# Patient Record
Sex: Female | Born: 1990 | Race: Black or African American | Hispanic: No | Marital: Married | Smoking: Former smoker
Health system: Southern US, Community
[De-identification: ages and names within clinical notes are randomized; demographics above are authoritative.]

## PROBLEM LIST (undated history)

## (undated) ENCOUNTER — Inpatient Hospital Stay (HOSPITAL_COMMUNITY): Payer: Self-pay

## (undated) DIAGNOSIS — N63 Unspecified lump in unspecified breast: Secondary | ICD-10-CM

## (undated) DIAGNOSIS — N926 Irregular menstruation, unspecified: Secondary | ICD-10-CM

## (undated) DIAGNOSIS — O009 Unspecified ectopic pregnancy without intrauterine pregnancy: Secondary | ICD-10-CM

## (undated) DIAGNOSIS — Z8619 Personal history of other infectious and parasitic diseases: Secondary | ICD-10-CM

## (undated) DIAGNOSIS — N898 Other specified noninflammatory disorders of vagina: Secondary | ICD-10-CM

## (undated) DIAGNOSIS — N760 Acute vaginitis: Secondary | ICD-10-CM

## (undated) DIAGNOSIS — IMO0002 Reserved for concepts with insufficient information to code with codable children: Secondary | ICD-10-CM

## (undated) DIAGNOSIS — D249 Benign neoplasm of unspecified breast: Secondary | ICD-10-CM

## (undated) DIAGNOSIS — B373 Candidiasis of vulva and vagina: Secondary | ICD-10-CM

## (undated) DIAGNOSIS — R1115 Cyclical vomiting syndrome unrelated to migraine: Secondary | ICD-10-CM

## (undated) DIAGNOSIS — B9689 Other specified bacterial agents as the cause of diseases classified elsewhere: Secondary | ICD-10-CM

## (undated) HISTORY — DX: Other specified noninflammatory disorders of vagina: N89.8

## (undated) HISTORY — DX: Reserved for concepts with insufficient information to code with codable children: IMO0002

## (undated) HISTORY — DX: Benign neoplasm of unspecified breast: D24.9

## (undated) HISTORY — DX: Irregular menstruation, unspecified: N92.6

## (undated) HISTORY — DX: Unspecified lump in unspecified breast: N63.0

## (undated) HISTORY — DX: Acute vaginitis: N76.0

## (undated) HISTORY — PX: DILATION AND CURETTAGE OF UTERUS: SHX78

## (undated) HISTORY — DX: Personal history of other infectious and parasitic diseases: Z86.19

## (undated) HISTORY — DX: Candidiasis of vulva and vagina: B37.3

## (undated) HISTORY — DX: Other specified bacterial agents as the cause of diseases classified elsewhere: B96.89

---
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
## 2009-09-12 DIAGNOSIS — B9689 Other specified bacterial agents as the cause of diseases classified elsewhere: Secondary | ICD-10-CM

## 2009-09-12 HISTORY — DX: Other specified bacterial agents as the cause of diseases classified elsewhere: B96.89

## 2010-04-26 DIAGNOSIS — B373 Candidiasis of vulva and vagina: Secondary | ICD-10-CM

## 2010-04-26 DIAGNOSIS — N63 Unspecified lump in unspecified breast: Secondary | ICD-10-CM

## 2010-04-26 DIAGNOSIS — B3731 Acute candidiasis of vulva and vagina: Secondary | ICD-10-CM

## 2010-04-26 HISTORY — DX: Candidiasis of vulva and vagina: B37.3

## 2010-04-26 HISTORY — DX: Unspecified lump in unspecified breast: N63.0

## 2010-04-26 HISTORY — DX: Acute candidiasis of vulva and vagina: B37.31

## 2010-05-08 ENCOUNTER — Encounter
Admission: RE | Admit: 2010-05-08 | Discharge: 2010-05-08 | Payer: Self-pay | Source: Home / Self Care | Attending: Obstetrics and Gynecology | Admitting: Obstetrics and Gynecology

## 2010-09-21 NOTE — Op Note (Signed)
NAMEBRIGIDA, Debbie Hughes             ACCOUNT NO.:  [id number]   MEDICAL RECORD NO.:  [id number]          PATIENT TYPE:  AMB   LOCATION:  DSC                          FACILITY:  [facility]   PHYSICIAN:  Leonie Man, M.D.   DATE OF BIRTH:  April 09, 1991   DATE OF PROCEDURE:  05/02/2006  DATE OF DISCHARGE:                               OPERATIVE REPORT   PREOPERATIVE DIAGNOSIS:  Right breast mass, probable fibroadenoma.   POSTOPERATIVE DIAGNOSIS:  Right breast mass, probable fibroadenoma,  pathology pending.   PROCEDURE:  Excision mass right left breast   SURGEON:  Leonie Man, M.D.   ASSISTANT:  OR nurse.   ANESTHESIA:  General.   SPECIMENS TO LAB:  Breast mass.   ESTIMATED BLOOD LOSS:  Minimal.   COMPLICATIONS:  None.   DISPOSITION:  The patient to PACU in excellent condition.   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  The mass measured approximately 3 cm in greatest diameter and is mobile.  The patient comes to the operating room after consent was obtained from  her family and from the patient for excision of this mass.   PROCEDURE:  The patient was positioned supinely, and following the  induction of satisfactory anesthesia, the left breast was prepped and  draped to be included in the sterile operative field.   A circumareolar incision was carried down through skin and subcutaneous  tissues and the breast tissues incised into a bun and carrying the  dissection down to the mass.  The mass was excised in its entirety and  removed and forwarded for pathologic evaluation.  Hemostasis was  obtained with electrocautery.  Sponge and instrument counts were  verified.  The breast tissues were reapproximated with 2-0 Vicryl.  The  subcutaneous tissues were reapproximated with 3-0 Vicryl.  The skin was  closed with 5-0 Monocryl and reinforced with Steri-Strips.  A sterile  dressings  was applied.  The anesthetic was reversed.   The patient was removed from the operating room to the recovery room in  stable condition.  She tolerated the procedure well.      Leonie Man, M.D.  Electronically Signed     PB/MEDQ  D:  05/02/2006  T:  05/02/2006  Job:  [id number]

## 2011-11-19 ENCOUNTER — Other Ambulatory Visit: Payer: BC Managed Care – PPO

## 2011-11-19 ENCOUNTER — Telehealth: Payer: Self-pay | Admitting: Obstetrics and Gynecology

## 2011-11-19 ENCOUNTER — Encounter: Payer: Self-pay | Admitting: Obstetrics and Gynecology

## 2011-11-19 ENCOUNTER — Ambulatory Visit (INDEPENDENT_AMBULATORY_CARE_PROVIDER_SITE_OTHER): Payer: BC Managed Care – PPO

## 2011-11-19 ENCOUNTER — Other Ambulatory Visit: Payer: Self-pay | Admitting: Obstetrics and Gynecology

## 2011-11-19 ENCOUNTER — Ambulatory Visit (INDEPENDENT_AMBULATORY_CARE_PROVIDER_SITE_OTHER): Payer: BC Managed Care – PPO | Admitting: Obstetrics and Gynecology

## 2011-11-19 VITALS — BP 102/60 | Resp 14 | Ht 63.0 in | Wt 118.0 lb

## 2011-11-19 DIAGNOSIS — O209 Hemorrhage in early pregnancy, unspecified: Secondary | ICD-10-CM

## 2011-11-19 DIAGNOSIS — N898 Other specified noninflammatory disorders of vagina: Secondary | ICD-10-CM

## 2011-11-19 DIAGNOSIS — N949 Unspecified condition associated with female genital organs and menstrual cycle: Secondary | ICD-10-CM

## 2011-11-19 DIAGNOSIS — Z331 Pregnant state, incidental: Secondary | ICD-10-CM

## 2011-11-19 LAB — POCT WET PREP (WET MOUNT)

## 2011-11-19 MED ORDER — METRONIDAZOLE 500 MG PO TABS: 500.0000 mg | ORAL_TABLET | Freq: Two times a day (BID) | ORAL | Status: AC

## 2011-11-19 NOTE — Progress Notes (Signed)
Pt presents c/o bleeding, not all that heavy and a +UPT at home with LMP 09/12/11.  Pt denies bleeding now and did have some cramping.  Filed Vitals:   11/19/11 1612  BP: 102/60  Resp: 14   ROS: noncontributory  Pelvic exam:  VULVA: normal appearing vulva with no masses, tenderness or lesions,  VAGINA: normal appearing vagina with normal color and discharge, no lesions, residual blood in vault, slight d/c with odor CERVIX: normal appearing cervix without discharge or lesions,  UTERUS: uterus is normal size, shape, consistency and nontender,  ADNEXA: normal adnexa in size, nontender and no masses.  Results for orders placed in visit on 11/19/11  POCT WET PREP (WET MOUNT)      Component Value Range   Source Wet Prep POC       WBC, Wet Prep HPF POC       Bacteria Wet Prep HPF POC mod     BACTERIA WET PREP MORPHOLOGY POC       Clue Cells Wet Prep HPF POC Moderate     CLUE CELLS WET PREP WHIFF POC Positive Whiff     Yeast Wet Prep HPF POC None     KOH Wet Prep POC       Trichomonas Wet Prep HPF POC none     pH 5.5+     U/S today no IUP, no GS, no Ectopic A/P Quant and Blood type today Pt reports a h/o being Rh + BV-Rx flagyl If quant is positive, rec repeat quant in 48hrs. If quant neg, pt wants to start birth control pills.  Will Rx orthotricyclen ( 1 tab po qd #1pack with 3 refills) and sched f/u in 3months.

## 2011-11-19 NOTE — Telephone Encounter (Signed)
Tc to pt per telephone call. Pt c/o spotting when wiping x past 2 days-present. Pt is pregnant. Pt c/o mild abd cramping. No fever. Consulted with ar, pt to have a QHCG, prenatal panel, ultrasound, and fu visit today. Appt sched today @3 :30 with ar. Pt voices understanding.

## 2011-11-20 ENCOUNTER — Telehealth: Payer: Self-pay

## 2011-11-20 ENCOUNTER — Encounter: Payer: BC Managed Care – PPO | Admitting: Obstetrics and Gynecology

## 2011-11-20 DIAGNOSIS — O209 Hemorrhage in early pregnancy, unspecified: Secondary | ICD-10-CM

## 2011-11-20 LAB — ABO AND RH: Rh Type: POSITIVE

## 2011-11-21 ENCOUNTER — Other Ambulatory Visit: Payer: BC Managed Care – PPO

## 2011-11-21 ENCOUNTER — Telehealth: Payer: Self-pay

## 2011-11-21 DIAGNOSIS — O209 Hemorrhage in early pregnancy, unspecified: Secondary | ICD-10-CM

## 2011-11-21 NOTE — Telephone Encounter (Signed)
Called pt this am to see if she will be able to come at 3:45 today for repeat quant. Pt states she will be here for that. [name] A

## 2011-11-22 LAB — US OB TRANSVAGINAL

## 2011-11-25 ENCOUNTER — Telehealth: Payer: Self-pay

## 2011-11-25 NOTE — Telephone Encounter (Signed)
Spoke to pt to let her know Dr. Su Hilt is rec follow up this week to have repeat quant and office visit. 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 I stressed the importance of the follow up, to be absolutely sure that it is not an ectopic pregnancy and offered a work note if she would need it. She will get back to me re: if she will be able to follow up this week. I encoureged her to call me if she has any sx's of increasing abd pain, left  shoulder pain, nausea, dizziness. Pt voices understanding. [name] A

## 2011-11-28 ENCOUNTER — Telehealth: Payer: Self-pay

## 2011-11-28 NOTE — Telephone Encounter (Signed)
Called pt again to see if pt is going to be able to get in to repeat quant. Pt's VM was full and I am unable to reach this pt. I stressed the importance of this testing when I spoke to her earlier in the week and pt states that she will call me when she finds out if she'll be able to follow up or not. [name] A

## 2011-11-29 ENCOUNTER — Telehealth: Payer: Self-pay

## 2011-12-03 ENCOUNTER — Telehealth: Payer: Self-pay

## 2011-12-03 NOTE — Telephone Encounter (Signed)
Attempted to reach pt re rec. For repeat quant. Her VM box is full. I cannot reach her. I will have [facility] send her a certified letter. Debbie Hughes A

## 2011-12-19 ENCOUNTER — Ambulatory Visit (INDEPENDENT_AMBULATORY_CARE_PROVIDER_SITE_OTHER): Payer: BC Managed Care – PPO | Admitting: Emergency Medicine

## 2011-12-19 VITALS — BP 95/61 | HR 88 | Temp 98.0°F | Resp 18 | Ht 63.5 in | Wt 122.0 lb

## 2011-12-19 DIAGNOSIS — B373 Candidiasis of vulva and vagina: Secondary | ICD-10-CM

## 2011-12-19 DIAGNOSIS — N898 Other specified noninflammatory disorders of vagina: Secondary | ICD-10-CM

## 2011-12-19 DIAGNOSIS — Z0000 Encounter for general adult medical examination without abnormal findings: Secondary | ICD-10-CM

## 2011-12-19 LAB — POCT WET PREP WITH KOH
KOH Prep POC: POSITIVE
Trichomonas, UA: NEGATIVE
Yeast Wet Prep HPF POC: NEGATIVE

## 2011-12-19 MED ORDER — BORIC ACID POWD: Status: DC

## 2011-12-19 NOTE — Progress Notes (Signed)
  Subjective:    Patient ID: Debbie Hughes, female    DOB: [birth year], 21 y.o.   MRN: [id number]  HPI    Review of Systems     Objective:   Physical Exam        Assessment & Plan:  Called in prescription to [facility] for boric acid vaginally.

## 2011-12-19 NOTE — Progress Notes (Signed)
  Date:  12/19/2011   Name:  Debbie Hughes   DOB:  07/28/1990   MRN:  [id number] Gender: female  Age: 21 y.o.  PCP:  Pcp Not In System    Chief Complaint: Annual Exam   History of Present Illness:  Debbie Hughes is a 21 y.o. pleasant patient who presents with the following:  For complete physical as part of wellness program.  No complaints  There is no problem list on file for this patient.   Past Medical History  Diagnosis Date  . H/O varicella   . Breast mass in female 01/21/06  . 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 Irregular menstrual cycle 07/28/07  . Vaginal Discharge 10/01/07  . Vaginitis   . BV (bacterial vaginosis) 5.10/11  . History of chlamydia   . Candida vaginitis 04/26/10  . Breast nodule 04/26/10    Left  . Fibroadenoma     No past surgical history on file.  History  Substance Use Topics  . Smoking status: Former Games developer  . Smokeless tobacco: Not on file  . Alcohol Use: No    Family History  Problem Relation Age of Onset  . Asthma Maternal Grandmother   . Hypertension Maternal Grandmother   . Diabetes Maternal Grandmother   . Hypertension Paternal Grandfather   . Diabetes Paternal Grandfather     Allergies  Allergen Reactions  . Penicillins Rash    Medication list has been reviewed and updated.  No current outpatient prescriptions on file prior to visit.    Review of Systems:  As per HPI, otherwise negative.    Physical Examination: Filed Vitals:   12/19/11 1207  BP: 95/61  Pulse: 88  Temp: 98 F (36.7 C)  Resp: 18   Filed Vitals:   12/19/11 1207  Height: 5' 3.5" (1.613 m)  Weight: 122 lb (55.339 kg)   Body mass index is 21.27 kg/(m^2). Ideal Body Weight: Weight in (lb) to have BMI = 25: 143.1   GEN: WDWN, NAD, Non-toxic, A & O x 3 HEENT: Atraumatic, Normocephalic. Neck supple. No masses, No LAD. Ears and Nose: No external deformity. CV: RRR, No M/G/R. No JVD. No thrill. No  extra heart sounds. PULM: CTA B, no wheezes, crackles, rhonchi. No retractions. No resp. distress. No accessory muscle use. ABD: S, NT, ND, +BS. No rebound. No HSM. EXTR: No c/c/e NEURO Normal gait.  PSYCH: Normally interactive. Conversant. Not depressed or anxious appearing.  Calm demeanor.  Genitalia:  Normal vulva, vagina and cervix.  Ut not tender and mobile, no adnexal tenderness or masses.  Thin white discharge.  Assessment and Plan: Candidal Vaginitis Says is chronic and not responsive to diflucan will discuss with GYN   Carmelina Dane, MD

## 2011-12-19 NOTE — Addendum Note (Signed)
Addended by: Carmelina Dane on: 12/19/2011 01:29 PM   Modules accepted: Orders

## 2011-12-21 ENCOUNTER — Ambulatory Visit (INDEPENDENT_AMBULATORY_CARE_PROVIDER_SITE_OTHER): Payer: BC Managed Care – PPO | Admitting: Family Medicine

## 2011-12-21 DIAGNOSIS — IMO0001 Reserved for inherently not codable concepts without codable children: Secondary | ICD-10-CM

## 2011-12-21 DIAGNOSIS — Z111 Encounter for screening for respiratory tuberculosis: Secondary | ICD-10-CM

## 2011-12-21 LAB — READ PPD: TB Skin Test: NEGATIVE

## 2011-12-23 LAB — PAP IG AND CT-NG NAA
Chlamydia Probe Amp: NEGATIVE
GC Probe Amp: NEGATIVE

## 2011-12-30 ENCOUNTER — Telehealth: Payer: Self-pay

## 2011-12-30 NOTE — Telephone Encounter (Signed)
Pt notified of labs

## 2011-12-30 NOTE — Telephone Encounter (Signed)
PATIENT RECEIVED A CALL FROM [place].  SHE ASSUMES IT IS ABOUT HER LAB WORK.  PLEASE CALL BACK [phone]

## 2011-12-30 NOTE — Telephone Encounter (Signed)
PATIENT RECEIVED A CALL FROM [place].  SHE ASSUMES IT WAS ABOUT HER LAB RESULTS.  PLEASE CALL BACK [phone]

## 2011-12-31 LAB — TB SKIN TEST: Induration: 0 mm

## 2012-03-09 NOTE — Telephone Encounter (Signed)
Note to close enc. Shironda Kain, Jacqueline A  

## 2012-03-09 NOTE — Telephone Encounter (Signed)
Note to close enc. Taryn Shellhammer, Jacqueline A  

## 2012-10-02 ENCOUNTER — Other Ambulatory Visit: Payer: Self-pay | Admitting: Family Medicine

## 2012-10-02 DIAGNOSIS — R599 Enlarged lymph nodes, unspecified: Secondary | ICD-10-CM

## 2013-01-02 ENCOUNTER — Encounter (HOSPITAL_COMMUNITY): Payer: Self-pay | Admitting: Emergency Medicine

## 2013-01-02 ENCOUNTER — Emergency Department (HOSPITAL_COMMUNITY)
Admission: EM | Admit: 2013-01-02 | Discharge: 2013-01-02 | Disposition: A | Discharge status: Home / Self Care | Payer: No Typology Code available for payment source | Attending: Emergency Medicine | Admitting: Emergency Medicine

## 2013-01-02 DIAGNOSIS — Z8742 Personal history of other diseases of the female genital tract: Secondary | ICD-10-CM | POA: Insufficient documentation

## 2013-01-02 DIAGNOSIS — S46909A Unspecified injury of unspecified muscle, fascia and tendon at shoulder and upper arm level, unspecified arm, initial encounter: Secondary | ICD-10-CM | POA: Insufficient documentation

## 2013-01-02 DIAGNOSIS — Y92410 Unspecified street and highway as the place of occurrence of the external cause: Secondary | ICD-10-CM | POA: Insufficient documentation

## 2013-01-02 DIAGNOSIS — S39012A Strain of muscle, fascia and tendon of lower back, initial encounter: Secondary | ICD-10-CM

## 2013-01-02 DIAGNOSIS — S59909A Unspecified injury of unspecified elbow, initial encounter: Secondary | ICD-10-CM | POA: Insufficient documentation

## 2013-01-02 DIAGNOSIS — S6990XA Unspecified injury of unspecified wrist, hand and finger(s), initial encounter: Secondary | ICD-10-CM | POA: Insufficient documentation

## 2013-01-02 DIAGNOSIS — M79601 Pain in right arm: Secondary | ICD-10-CM

## 2013-01-02 DIAGNOSIS — Z87891 Personal history of nicotine dependence: Secondary | ICD-10-CM | POA: Insufficient documentation

## 2013-01-02 DIAGNOSIS — Z880 Allergy status to penicillin: Secondary | ICD-10-CM | POA: Insufficient documentation

## 2013-01-02 DIAGNOSIS — Y9389 Activity, other specified: Secondary | ICD-10-CM | POA: Insufficient documentation

## 2013-01-02 DIAGNOSIS — IMO0002 Reserved for concepts with insufficient information to code with codable children: Secondary | ICD-10-CM | POA: Insufficient documentation

## 2013-01-02 DIAGNOSIS — S4980XA Other specified injuries of shoulder and upper arm, unspecified arm, initial encounter: Secondary | ICD-10-CM | POA: Insufficient documentation

## 2013-01-02 DIAGNOSIS — R209 Unspecified disturbances of skin sensation: Secondary | ICD-10-CM | POA: Insufficient documentation

## 2013-01-02 DIAGNOSIS — Z8619 Personal history of other infectious and parasitic diseases: Secondary | ICD-10-CM | POA: Insufficient documentation

## 2013-01-02 MED ORDER — METHOCARBAMOL 500 MG PO TABS: 500.0000 mg | ORAL_TABLET | Freq: Two times a day (BID) | ORAL | Status: DC
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
## 2013-01-05 NOTE — ED Provider Notes (Signed)
Medical screening examination/treatment/procedure(s) were performed by non-physician practitioner and as supervising physician I was immediately available for consultation/collaboration.    Jaxsyn Catalfamo L Brenin Heidelberger, MD 01/05/13 0724 

## 2013-03-24 ENCOUNTER — Inpatient Hospital Stay (HOSPITAL_COMMUNITY): Payer: BC Managed Care – PPO | Admitting: Anesthesiology

## 2013-03-24 ENCOUNTER — Ambulatory Visit (HOSPITAL_COMMUNITY)
Admission: AD | Admit: 2013-03-24 | Discharge: 2013-03-24 | Disposition: A | Discharge status: Home / Self Care | Payer: BC Managed Care – PPO | Source: Ambulatory Visit | Attending: Obstetrics and Gynecology | Admitting: Obstetrics and Gynecology

## 2013-03-24 ENCOUNTER — Encounter (HOSPITAL_COMMUNITY): Payer: Self-pay | Admitting: General Practice

## 2013-03-24 ENCOUNTER — Encounter (HOSPITAL_COMMUNITY): Payer: BC Managed Care – PPO | Admitting: Anesthesiology

## 2013-03-24 ENCOUNTER — Encounter (HOSPITAL_COMMUNITY)
Admission: AD | Disposition: A | Discharge status: Home / Self Care | Payer: Self-pay | Source: Ambulatory Visit | Attending: Obstetrics and Gynecology

## 2013-03-24 DIAGNOSIS — O00109 Unspecified tubal pregnancy without intrauterine pregnancy: Secondary | ICD-10-CM | POA: Diagnosis present

## 2013-03-24 DIAGNOSIS — O009 Unspecified ectopic pregnancy without intrauterine pregnancy: Secondary | ICD-10-CM | POA: Diagnosis present

## 2013-03-24 HISTORY — DX: Unspecified ectopic pregnancy without intrauterine pregnancy: O00.90

## 2013-03-24 HISTORY — PX: UNILATERAL SALPINGECTOMY: SHX6160

## 2013-03-24 HISTORY — PX: LAPAROSCOPY: SHX197

## 2013-03-24 HISTORY — PX: OTHER SURGICAL HISTORY: SHX169

## 2013-03-24 LAB — CBC
HCT: 35.6 % — ABNORMAL LOW (ref 36.0–46.0)
Hemoglobin: 12.4 g/dL (ref 12.0–15.0)
MCH: 31.6 pg (ref 26.0–34.0)
MCHC: 34.8 g/dL (ref 30.0–36.0)
MCV: 90.8 fL (ref 78.0–100.0)
Platelets: 216 10*3/uL (ref 150–400)
RBC: 3.92 MIL/uL (ref 3.87–5.11)
RDW: 11.4 % — ABNORMAL LOW (ref 11.5–15.5)
WBC: 7.8 10*3/uL (ref 4.0–10.5)

## 2013-03-24 LAB — TYPE AND SCREEN
ABO/RH(D): A POS
Antibody Screen: NEGATIVE

## 2013-03-24 LAB — COMPREHENSIVE METABOLIC PANEL
ALT: 13 U/L (ref 0–35)
AST: 21 U/L (ref 0–37)
Albumin: 3.9 g/dL (ref 3.5–5.2)
Alkaline Phosphatase: 56 U/L (ref 39–117)
BUN: 9 mg/dL (ref 6–23)
CO2: 24 mEq/L (ref 19–32)
Calcium: 9.3 mg/dL (ref 8.4–10.5)
Chloride: 103 mEq/L (ref 96–112)
Creatinine, Ser: 0.51 mg/dL (ref 0.50–1.10)
GFR calc Af Amer: >90 mL/min (ref >90)
GFR calc non Af Amer: >90 mL/min (ref >90)
Glucose, Bld: 100 mg/dL — ABNORMAL HIGH (ref 70–99)
Potassium: 3.3 mEq/L — ABNORMAL LOW (ref 3.5–5.1)
Sodium: 136 mEq/L (ref 135–145)
Total Bilirubin: 0.3 mg/dL (ref 0.3–1.2)
Total Protein: 6.8 g/dL (ref 6.0–8.3)

## 2013-03-24 SURGERY — LAPAROSCOPY OPERATIVE
Anesthesia: General | Site: Abdomen | Wound class: Clean Contaminated

## 2013-03-24 MED ORDER — FENTANYL CITRATE 0.05 MG/ML IJ SOLN
INTRAMUSCULAR | Status: AC
  Filled 2013-03-24: qty 5

## 2013-03-24 MED ORDER — HYDROCODONE-ACETAMINOPHEN 5-325 MG PO TABS
ORAL_TABLET | ORAL | Status: AC
  Filled 2013-03-24: qty 1

## 2013-03-24 MED ORDER — NEOSTIGMINE METHYLSULFATE 1 MG/ML IJ SOLN
INTRAMUSCULAR | Status: DC | PRN
  Administered 2013-03-24: 4 mg via INTRAVENOUS

## 2013-03-24 MED ORDER — FENTANYL CITRATE 0.05 MG/ML IJ SOLN
25.0000 ug | INTRAMUSCULAR | Status: DC | PRN
  Administered 2013-03-24 (×3): 50 ug via INTRAVENOUS

## 2013-03-24 MED ORDER — HYDROCODONE-ACETAMINOPHEN 5-325 MG PO TABS
1.0000 | ORAL_TABLET | Freq: Four times a day (QID) | ORAL | Status: AC | PRN
  Administered 2013-03-24: 1 via ORAL

## 2013-03-24 MED ORDER — GLYCOPYRROLATE 0.2 MG/ML IJ SOLN
INTRAMUSCULAR | Status: AC
  Filled 2013-03-24: qty 3

## 2013-03-24 MED ORDER — PROPOFOL 10 MG/ML IV EMUL
INTRAVENOUS | Status: AC
  Filled 2013-03-24: qty 20

## 2013-03-24 MED ORDER — KETOROLAC TROMETHAMINE 30 MG/ML IJ SOLN
INTRAMUSCULAR | Status: AC
  Filled 2013-03-24: qty 1

## 2013-03-24 MED ORDER — NEOSTIGMINE METHYLSULFATE 1 MG/ML IJ SOLN
INTRAMUSCULAR | Status: AC
  Filled 2013-03-24: qty 1

## 2013-03-24 MED ORDER — HYDROCODONE-ACETAMINOPHEN 5-325 MG PO TABS: ORAL_TABLET | ORAL | Status: DC

## 2013-03-24 MED ORDER — ROCURONIUM BROMIDE 100 MG/10ML IV SOLN
INTRAVENOUS | Status: DC | PRN
  Administered 2013-03-24: 20 mg via INTRAVENOUS

## 2013-03-24 MED ORDER — GLYCOPYRROLATE 0.2 MG/ML IJ SOLN
INTRAMUSCULAR | Status: DC | PRN
  Administered 2013-03-24: 0.6 mg via INTRAVENOUS

## 2013-03-24 MED ORDER — FENTANYL CITRATE 0.05 MG/ML IJ SOLN
INTRAMUSCULAR | Status: DC | PRN
  Administered 2013-03-24: 50 ug via INTRAVENOUS
  Administered 2013-03-24 (×2): 100 ug via INTRAVENOUS

## 2013-03-24 MED ORDER — LIDOCAINE HCL (CARDIAC) 20 MG/ML IV SOLN
INTRAVENOUS | Status: AC
  Filled 2013-03-24: qty 5

## 2013-03-24 MED ORDER — LACTATED RINGERS IV SOLN: INTRAVENOUS | Status: DC

## 2013-03-24 MED ORDER — LIDOCAINE HCL (CARDIAC) 20 MG/ML IV SOLN
INTRAVENOUS | Status: DC | PRN
  Administered 2013-03-24: 50 mg via INTRAVENOUS

## 2013-03-24 MED ORDER — SUCCINYLCHOLINE CHLORIDE 20 MG/ML IJ SOLN
INTRAMUSCULAR | Status: DC | PRN
  Administered 2013-03-24: 120 mg via INTRAVENOUS

## 2013-03-24 MED ORDER — LACTATED RINGERS IV SOLN
INTRAVENOUS | Status: DC
  Administered 2013-03-24 (×2): via INTRAVENOUS

## 2013-03-24 MED ORDER — FENTANYL CITRATE 0.05 MG/ML IJ SOLN
INTRAMUSCULAR | Status: AC
  Filled 2013-03-24: qty 2

## 2013-03-24 MED ORDER — ONDANSETRON HCL 4 MG/2ML IJ SOLN
INTRAMUSCULAR | Status: AC
  Filled 2013-03-24: qty 2

## 2013-03-24 MED ORDER — MIDAZOLAM HCL 2 MG/2ML IJ SOLN
INTRAMUSCULAR | Status: AC
  Filled 2013-03-24: qty 2

## 2013-03-24 MED ORDER — IBUPROFEN 600 MG PO TABS: ORAL_TABLET | ORAL | Status: DC

## 2013-03-24 MED ORDER — MIDAZOLAM HCL 5 MG/5ML IJ SOLN
INTRAMUSCULAR | Status: DC | PRN
  Administered 2013-03-24: 2 mg via INTRAVENOUS

## 2013-03-24 MED ORDER — BUPIVACAINE-EPINEPHRINE 0.25% -1:200000 IJ SOLN
INTRAMUSCULAR | Status: DC | PRN
  Administered 2013-03-24: 4 mL

## 2013-03-24 MED ORDER — PHENYLEPHRINE HCL 10 MG/ML IJ SOLN
INTRAMUSCULAR | Status: DC | PRN
  Administered 2013-03-24 (×2): 40 ug via INTRAVENOUS
  Administered 2013-03-24: 80 ug via INTRAVENOUS

## 2013-03-24 MED ORDER — DEXAMETHASONE SODIUM PHOSPHATE 4 MG/ML IJ SOLN
INTRAMUSCULAR | Status: DC | PRN
  Administered 2013-03-24: 10 mg via INTRAVENOUS

## 2013-03-24 MED ORDER — FENTANYL CITRATE 0.05 MG/ML IJ SOLN
INTRAMUSCULAR | Status: AC
  Administered 2013-03-24: 50 ug via INTRAVENOUS
  Filled 2013-03-24: qty 2

## 2013-03-24 MED ORDER — MEPERIDINE HCL 25 MG/ML IJ SOLN: 6.2500 mg | INTRAMUSCULAR | Status: DC | PRN

## 2013-03-24 MED ORDER — ROCURONIUM BROMIDE 100 MG/10ML IV SOLN
INTRAVENOUS | Status: AC
  Filled 2013-03-24: qty 1

## 2013-03-24 MED ORDER — KETOROLAC TROMETHAMINE 30 MG/ML IJ SOLN
INTRAMUSCULAR | Status: DC | PRN
  Administered 2013-03-24: 30 mg via INTRAVENOUS

## 2013-03-24 MED ORDER — PROMETHAZINE HCL 25 MG/ML IJ SOLN
INTRAMUSCULAR | Status: AC
  Filled 2013-03-24: qty 1

## 2013-03-24 MED ORDER — PROMETHAZINE HCL 25 MG/ML IJ SOLN
6.2500 mg | INTRAMUSCULAR | Status: DC | PRN
  Administered 2013-03-24: 6.25 mg via INTRAVENOUS

## 2013-03-24 MED ORDER — PROPOFOL 10 MG/ML IV BOLUS
INTRAVENOUS | Status: DC | PRN
  Administered 2013-03-24: 180 mg via INTRAVENOUS

## 2013-03-24 MED ORDER — ONDANSETRON HCL 4 MG/2ML IJ SOLN
INTRAMUSCULAR | Status: DC | PRN
  Administered 2013-03-24: 4 mg via INTRAVENOUS

## 2013-03-24 SURGICAL SUPPLY — 30 items
BARRIER ADHS 3X4 INTERCEED (GAUZE/BANDAGES/DRESSINGS) IMPLANT
CABLE HIGH FREQUENCY MONO STRZ (ELECTRODE) IMPLANT
CHLORAPREP W/TINT 26ML (MISCELLANEOUS) ×3 IMPLANT
CLOTH BEACON ORANGE TIMEOUT ST (SAFETY) ×3 IMPLANT
DERMABOND ADVANCED (GAUZE/BANDAGES/DRESSINGS) ×1
DERMABOND ADVANCED .7 DNX12 (GAUZE/BANDAGES/DRESSINGS) ×2 IMPLANT
DRSG VASELINE 3X18 (GAUZE/BANDAGES/DRESSINGS) IMPLANT
FORCEPS CUTTING 33CM 5MM (CUTTING FORCEPS) ×3 IMPLANT
FORCEPS CUTTING 45CM 5MM (CUTTING FORCEPS) IMPLANT
GLOVE BIO SURGEON STRL SZ 6.5 (GLOVE) ×3 IMPLANT
GLOVE BIOGEL PI IND STRL 7.0 (GLOVE) ×4 IMPLANT
GLOVE BIOGEL PI INDICATOR 7.0 (GLOVE) ×2
GOWN STRL REIN XL XLG (GOWN DISPOSABLE) ×6 IMPLANT
MANIPULATOR UTERINE 4.5 ZUMI (MISCELLANEOUS) IMPLANT
NS IRRIG 1000ML POUR BTL (IV SOLUTION) ×3 IMPLANT
PACK LAPAROSCOPY BASIN (CUSTOM PROCEDURE TRAY) ×3 IMPLANT
POUCH SPECIMEN RETRIEVAL 10MM (ENDOMECHANICALS) IMPLANT
PROTECTOR NERVE ULNAR (MISCELLANEOUS) ×3 IMPLANT
SCALPEL HARMONIC ACE (MISCELLANEOUS) IMPLANT
SET IRRIG TUBING LAPAROSCOPIC (IRRIGATION / IRRIGATOR) IMPLANT
SOLUTION ELECTROLUBE (MISCELLANEOUS) IMPLANT
SUT MNCRL AB 3-0 PS2 27 (SUTURE) ×3 IMPLANT
SUT VICRYL 0 ENDOLOOP (SUTURE) IMPLANT
SUT VICRYL 0 UR6 27IN ABS (SUTURE) ×6 IMPLANT
TOWEL OR 17X24 6PK STRL BLUE (TOWEL DISPOSABLE) ×6 IMPLANT
TRAY FOLEY CATH 14FR (SET/KITS/TRAYS/PACK) ×3 IMPLANT
TROCAR BALLN 12MMX100 BLUNT (TROCAR) ×3 IMPLANT
TROCAR XCEL NON-BLD 5MMX100MML (ENDOMECHANICALS) ×6 IMPLANT
WARMER LAPAROSCOPE (MISCELLANEOUS) ×3 IMPLANT
WATER STERILE IRR 1000ML POUR (IV SOLUTION) IMPLANT

## 2013-03-24 NOTE — Anesthesia Preprocedure Evaluation (Signed)
Anesthesia Evaluation  Patient identified by MRN, date of birth, ID band Patient awake    Reviewed: Allergy & Precautions, H&P , NPO status , Patient's Chart, lab work & pertinent test results  Airway Mallampati: II TM Distance: >3 FB Neck ROM: Full    Dental no notable dental hx.    Pulmonary neg pulmonary ROS, former smoker,  breath sounds clear to auscultation  Pulmonary exam normal       Cardiovascular negative cardio ROS  Rhythm:Regular Rate:Normal     Neuro/Psych negative neurological ROS  negative psych ROS   GI/Hepatic negative GI ROS, Neg liver ROS,   Endo/Other  negative endocrine ROS  Renal/GU negative Renal ROS  negative genitourinary   Musculoskeletal negative musculoskeletal ROS (+)   Abdominal   Peds negative pediatric ROS (+)  Hematology negative hematology ROS (+)   Anesthesia Other Findings   Reproductive/Obstetrics negative OB ROS (+) Pregnancy                           Anesthesia Physical Anesthesia Plan  ASA: II and emergent  Anesthesia Plan: General   Post-op Pain Management:    Induction: Intravenous, Rapid sequence and Cricoid pressure planned  Airway Management Planned: Oral ETT  Additional Equipment:   Intra-op Plan:   Post-operative Plan: Extubation in OR  Informed Consent: I have reviewed the patients History and Physical, chart, labs and discussed the procedure including the risks, benefits and alternatives for the proposed anesthesia with the patient or authorized representative who has indicated his/her understanding and acceptance.   Dental advisory given  Plan Discussed with: CRNA  Anesthesia Plan Comments:         Anesthesia Quick Evaluation

## 2013-03-24 NOTE — Anesthesia Postprocedure Evaluation (Signed)
  Anesthesia Post-op Note  Patient: Debbie Hughes  Procedure(s) Performed: Procedure(s) (LRB): LAPAROSCOPY OPERATIVE (N/A) UNILATERAL SALPINGECTOMY (Left)  Patient Location: PACU  Anesthesia Type: General  Level of Consciousness: awake and alert   Airway and Oxygen Therapy: Patient Spontanous Breathing  Post-op Pain: mild  Post-op Assessment: Post-op Vital signs reviewed, Patient's Cardiovascular Status Stable, Respiratory Function Stable, Patent Airway and No signs of Nausea or vomiting  Last Vitals:  Filed Vitals:   03/24/13 2115  BP: 118/69  Pulse: 88  Temp:   Resp: 14    Post-op Vital Signs: stable   Complications: No apparent anesthesia complications

## 2013-03-24 NOTE — Transfer of Care (Signed)
Immediate Anesthesia Transfer of Care Note  Patient: Debbie Hughes  Procedure(s) Performed: Procedure(s): LAPAROSCOPY OPERATIVE (N/A) UNILATERAL SALPINGECTOMY (Left)  Patient Location: PACU  Anesthesia Type:General  Level of Consciousness: awake, alert , oriented and patient cooperative  Airway & Oxygen Therapy: Patient Spontanous Breathing and Patient connected to nasal cannula oxygen  Post-op Assessment: Report given to PACU RN, Post -op Vital signs reviewed and stable and Patient moving all extremities X 4  Post vital signs: Reviewed and stable  Complications: No apparent anesthesia complications

## 2013-03-24 NOTE — Op Note (Signed)
Diagnostic Laparoscopy Procedure Note  Indications: The patient is a 22 y.o. female with left ectopic pregnacy.  Pre-operative Diagnosis: ectopic pregnacy  Post-operative Diagnosis: same  Surgeon: [id number],YQMVH A   Assistants: E. Powell. PA  Anesthesia: General endotracheal anesthesia  ASA Class: per anesthesia  Procedure Details  The patient was seen in the Holding Room. The risks, benefits, complications, treatment options, and expected outcomes were discussed with the patient. The possibilities of reaction to medication, pulmonary aspiration, perforation of viscus, bleeding, recurrent infection, the need for additional procedures, failure to diagnose a condition, and creating a complication requiring transfusion or operation were discussed with the patient. The patient concurred with the proposed plan, giving informed consent. The patient was taken to the Operating Room, identified as Debbie Hughes and the procedure verified as Diagnostic Laparoscopy. A Time Out was held and the above information confirmed.  After induction of general anesthesia, the patient was placed in modified dorsal lithotomy position where she was prepped, draped, and catheterized in the normal, sterile fashion.      Estimated Blood Loss:  Minimal         Drains: none         Total IV Fluids:         Specimens: left tube and ectopic              Complications:  None; patient tolerated the procedure well.         Disposition: PACU - hemodynamically stable.         Condition: stable Procedure in detail:  The patient was taken to the operating room prepped and draped in the normal sterile fashion.  Patient was lithotomy position.  Bivalve speculum was placed into the vagina the anterior lip of the cervix grasped with single tooth tenaculum acorn or was placed into the cervix and fastened to the tenaculum bivalve speculum was removed. It was then turned to the abdomen where any 1 cm  infraumbilical incision was made with a scalpel and carried down to the fascia. Fascia was incised and carried down to the peritoneum. The peritoneum was entered sharply a using a hemostat.  French suture was placed around the fascia. The Roseanne Reno was placed into the abdominal cavity. The abdomen was insufflated with CO2 gas. Findings: there was some blood in anterior posterior cul-de-sac the bladder appeared normal the patient's right fallopian tube appeared normal patient's left fallopian tube had an ectopic pregnancy which comprised the entire tube. The patient had some hemosiderin dark changes in the posterior cul-de-sac which could have been from the bleeding or  endometriosis. The patient's liver appendix and abdominal anatomy was normal. Two 5 mm trochars were placed in direct visualization of the laparoscope in the right and left lower quadrants of the abdomen. Using the gyrus the left fallopian tube and ectopic was removed. The ectopic took up the entire tube at the decision was made to do a salpingectomy is a salpingectomy. A 10 mm scope was then removed out of the abdomen and switched for a 5 mm scope was placed through the 5 mm trocar in the right lower quadrant.  Millimeter bag was placed through the 10 mm Hassan trocar and grasper was placed in the left lower quadrant trocar ectopic which had been placed in the cul-de-sac was then placed in the bag and removed on the abdomen and sent to pathology for evaluation. Carbon dioxide was  Allowed to leave the abdomen. We re- insufflated the abdomen in the area where  the tube was  removed was hemostatic. the pelvis was irrigated,  and hemostasis was assured. All trocars were removed under direct visualization of the laparoscope. Fascia was reapproximated by tying the suture. The skin  was reapproximated at the umbilicus using 3-0 Monocryl and Dermabond. The  Two 5 mm incisions were reapproximated using Dermabond. The Acorn and tenaculum was removed from the  cervix. Hemostasis was noted sponge lap and needle counts were correct patient are currently stable condition this is Dr. Normand Sloop dictating thank you very much

## 2013-03-24 NOTE — H&P (Signed)
Debbie Hughes is an 22 y.o. female. LEFT ectopic pregnancy with a heart beat.  Pt presented for evaluation of bleeding in the office.  [place] diagnosed her with an ectopic.  She denies having any abdominal pain.  She has had some spotting. She does have a remote history of chlamydia  Pertinent Gynecological History: Menses: flow is moderate Bleeding: see above Contraception: none DES exposure: unknown Blood transfusions: none Sexually transmitted diseases: chlamydia Previous GYN Procedures: none  Last mammogram: abnormal: na Date: na Last pap: normal Date: 2013 OB History: G2, P0010   Menstrual History: Menarche age: 43  Patient's last menstrual period was 01/27/2013.    Past Medical History  Diagnosis Date  . H/O varicella   . Breast mass in female 01/21/06  . 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 Irregular menstrual cycle 07/28/07  . Vaginal discharge 10/01/07  . Vaginitis   . BV (bacterial vaginosis) 5.10/11  . History of chlamydia   . Candida vaginitis 04/26/10  . Breast nodule 04/26/10    Left  . Fibroadenoma     Past Surgical History  Procedure Laterality Date  . Breast cyst excision Left 2007    Family History  Problem Relation Age of Onset  . Asthma Maternal Grandmother   . Hypertension Maternal Grandmother   . Diabetes Maternal Grandmother   . Hypertension Paternal Grandfather   . Diabetes Paternal Grandfather     Social History:  reports that she has quit smoking. She does not have any smokeless tobacco history on file. She reports that she does not drink alcohol or use illicit drugs.  Allergies:  Allergies  Allergen Reactions  . Penicillins Rash    No prescriptions prior to admission    ROS  Blood pressure 117/69, pulse 86, temperature 98.3 F (36.8 C), resp. rate 18, height 5\' 2"  (1.575 m), weight 114 lb 6.4 oz (51.891 kg), last menstrual period 01/27/2013, unknown if currently breastfeeding. Physical  Exam Physical Examination: General appearance - alert, well appearing, and in no distress Chest - clear to auscultation, no wheezes, rales or rhonchi, symmetric air entry Heart - normal rate and regular rhythm Abdomen - soft, nontender, nondistended, no masses or organomegaly Pelvic - def to OR Extremities - Homan's sign negative bilaterally   Results for orders placed during the hospital encounter of 03/24/13 (from the past 24 hour(s))  CBC     Status: Abnormal   Collection Time    03/24/13  6:50 PM      Result Value Range   WBC 7.8  4.0 - 10.5 K/uL   RBC 3.92  3.87 - 5.11 MIL/uL   Hemoglobin 12.4  12.0 - 15.0 g/dL   HCT 21.3 (*) 08.6 - 57.8 %   MCV 90.8  78.0 - 100.0 fL   MCH 31.6  26.0 - 34.0 pg   MCHC 34.8  30.0 - 36.0 g/dL   RDW 46.9 (*) 62.9 - 52.8 %   Platelets 216  150 - 400 K/uL    No results found.  Assessment/Plan: Ectopic pregnancy Pt is stable I explained to the pt that surgery is recommended.  I also told her about tx with MTX.  She understands because there is a heart beat MTX is a relative contraindication.  Pt given the risks of both MTX, MTX failure and surgery.  After speaking with her family, she is going to have surgery.  She is consented for salpingosotomy, possible salpingectomy poss ex lap.  Pt understands  the risks to be but not limited to bleeding, infection, damage to internal organs such as bowel and bladder.    Jillianna Stanek A 03/24/2013, 7:25 PM

## 2013-03-24 NOTE — MAU Note (Signed)
CRNA at bedside, per request for IV. 2 RN attempts

## 2013-03-25 ENCOUNTER — Encounter (HOSPITAL_COMMUNITY): Payer: Self-pay | Admitting: Obstetrics and Gynecology

## 2013-03-25 LAB — ABO/RH: ABO/RH(D): A POS

## 2013-03-28 NOTE — Discharge Summary (Signed)
Physician Discharge Summary  Patient ID: WAYNETTA METHENY MRN: [id number] DOB/AGE: August 29, 1990 22 y.o.  Admit date: 03/24/2013 Discharge date: 03/28/2013   Discharge Diagnoses:  Left Ectopic Pregnancy Active Problems:   * No active hospital problems. *   Operation: Operative Laparoscopy with Removal of Left Ectopic Pregnancy   Discharged Condition: Good  Hospital Course: On the date of admission the patient underwent the aforementioned procedure and tolerated procedure well.  Post operative course was unremarkable with the patient being discharged from PACU in good  condition.  Disposition: 01-Home or Self Care  Discharge Medications:    Medication List         HYDROcodone-acetaminophen 5-325 MG per tablet  Commonly known as:  NORCO/VICODIN  1 po every 6 hours as needed for moderate to severe pain     ibuprofen 600 MG tablet  Commonly known as:  ADVIL,MOTRIN  1 po pc every 6 hours for 3 days then prn-pain         Follow-up: DrMarland Kitchen Samule Ohm A. Dillard in two weeks   Signed: Patrick Jupiter 03/28/2013, 8:44 PM

## 2013-05-06 NOTE — L&D Delivery Note (Signed)
0032: Call from nurse reporting patient 9/100/+1 station.  Provider to room and patient reporting urge to push.  Patient allowed to push with urge and delivered as below.  Cat I FT throughout the labor process.    Delivery Note At 12:50 AM, on Dec. 25, 2015, a viable female, "Debbie Hughes", was delivered via Vaginal, Spontaneous Delivery (Presentation: Right Occiput Anterior). Shoulders delivered easily and infant  immediately placed upon mother's abdomen and noted to have good tone and spontaneous cry.  Nurse provided tactile stimulation and infant APGARs: 8, 9; Cord clamped, cut, and blood collected. Placenta delivered spontaneously and noted to be intact with 3VC upon inspection.  Vaginal inspection revealed no lacerations.  Fundus firm, at the umbilicus, and bleeding small.  Mother hemodynamically stable and infant skin to skin prior to provider exit.  Mother desires Micronor for birth control method and risk and benefits of this method discussed.  Mother opts to breastfeed.  Infant weight at one hour of life: 5lbs 3.8oz  Anesthesia: None  Episiotomy: None Lacerations: None Suture Repair: None Est. Blood Loss (mL): 100  Mom to postpartum.  Baby to Couplet care / Skin to Skin.  Keali Mccraw, [place] MSN, CNM 04/29/2014, 1:28 AM

## 2013-08-31 ENCOUNTER — Emergency Department (HOSPITAL_COMMUNITY): Payer: BC Managed Care – PPO

## 2013-08-31 ENCOUNTER — Encounter (HOSPITAL_COMMUNITY): Payer: Self-pay | Admitting: Emergency Medicine

## 2013-08-31 ENCOUNTER — Emergency Department (HOSPITAL_COMMUNITY)
Admission: EM | Admit: 2013-08-31 | Discharge: 2013-08-31 | Disposition: A | Discharge status: Home / Self Care | Payer: BC Managed Care – PPO | Attending: Emergency Medicine | Admitting: Emergency Medicine

## 2013-08-31 DIAGNOSIS — Z87891 Personal history of nicotine dependence: Secondary | ICD-10-CM | POA: Insufficient documentation

## 2013-08-31 DIAGNOSIS — Z880 Allergy status to penicillin: Secondary | ICD-10-CM | POA: Insufficient documentation

## 2013-08-31 DIAGNOSIS — Z8619 Personal history of other infectious and parasitic diseases: Secondary | ICD-10-CM | POA: Insufficient documentation

## 2013-08-31 DIAGNOSIS — Z8742 Personal history of other diseases of the female genital tract: Secondary | ICD-10-CM | POA: Insufficient documentation

## 2013-08-31 DIAGNOSIS — O210 Mild hyperemesis gravidarum: Secondary | ICD-10-CM

## 2013-08-31 LAB — CBC WITH DIFFERENTIAL/PLATELET
Basophils Absolute: 0 10*3/uL (ref 0.0–0.1)
Basophils Relative: 0 % (ref 0–1)
EOS ABS: 0 10*3/uL (ref 0.0–0.7)
EOS PCT: 0 % (ref 0–5)
HCT: 38.5 % (ref 36.0–46.0)
HEMOGLOBIN: 13.7 g/dL (ref 12.0–15.0)
Lymphocytes Relative: 5 % — ABNORMAL LOW (ref 12–46)
Lymphs Abs: 0.6 10*3/uL — ABNORMAL LOW (ref 0.7–4.0)
MCH: 32.8 pg (ref 26.0–34.0)
MCHC: 35.6 g/dL (ref 30.0–36.0)
MCV: 92.1 fL (ref 78.0–100.0)
MONO ABS: 0.2 10*3/uL (ref 0.1–1.0)
MONOS PCT: 2 % — AB (ref 3–12)
NEUTROS PCT: 93 % — AB (ref 43–77)
Neutro Abs: 12.6 10*3/uL — ABNORMAL HIGH (ref 1.7–7.7)
Platelets: 232 10*3/uL (ref 150–400)
RBC: 4.18 MIL/uL (ref 3.87–5.11)
RDW: 11.5 % (ref 11.5–15.5)
WBC: 13.5 10*3/uL — ABNORMAL HIGH (ref 4.0–10.5)

## 2013-08-31 LAB — COMPREHENSIVE METABOLIC PANEL
ALK PHOS: 71 U/L (ref 39–117)
ALT: 20 U/L (ref 0–35)
AST: 31 U/L (ref 0–37)
Albumin: 4.6 g/dL (ref 3.5–5.2)
BUN: 15 mg/dL (ref 6–23)
CO2: 16 mEq/L — ABNORMAL LOW (ref 19–32)
Calcium: 10.1 mg/dL (ref 8.4–10.5)
Chloride: 99 mEq/L (ref 96–112)
Creatinine, Ser: 0.58 mg/dL (ref 0.50–1.10)
GFR calc non Af Amer: >90 mL/min (ref >90)
GLUCOSE: 115 mg/dL — AB (ref 70–99)
POTASSIUM: 3.6 meq/L — AB (ref 3.7–5.3)
Sodium: 136 mEq/L — ABNORMAL LOW (ref 137–147)
TOTAL PROTEIN: 8.6 g/dL — AB (ref 6.0–8.3)
Total Bilirubin: 0.8 mg/dL (ref 0.3–1.2)

## 2013-08-31 LAB — URINALYSIS, ROUTINE W REFLEX MICROSCOPIC
BILIRUBIN URINE: NEGATIVE
Glucose, UA: NEGATIVE mg/dL
HGB URINE DIPSTICK: NEGATIVE
Ketones, ur: >80 mg/dL — AB
Nitrite: NEGATIVE
Protein, ur: 100 mg/dL — AB
Specific Gravity, Urine: 1.03 (ref 1.005–1.030)
UROBILINOGEN UA: 0.2 mg/dL (ref 0.0–1.0)
pH: 8 (ref 5.0–8.0)

## 2013-08-31 LAB — URINE MICROSCOPIC-ADD ON

## 2013-08-31 LAB — POC URINE PREG, ED: Preg Test, Ur: POSITIVE — AB

## 2013-08-31 LAB — LIPASE, BLOOD: LIPASE: 27 U/L (ref 11–59)

## 2013-08-31 LAB — HCG, QUANTITATIVE, PREGNANCY: HCG, BETA CHAIN, QUANT, S: 237 m[IU]/mL — AB (ref <5)

## 2013-08-31 MED ORDER — SODIUM CHLORIDE 0.9 % IV BOLUS (SEPSIS)
1000.0000 mL | Freq: Once | INTRAVENOUS | Status: AC
  Administered 2013-08-31: 1000 mL via INTRAVENOUS

## 2013-08-31 MED ORDER — SODIUM CHLORIDE 0.9 % IV SOLN: INTRAVENOUS | Status: DC

## 2013-08-31 MED ORDER — ONDANSETRON HCL 4 MG/2ML IJ SOLN
4.0000 mg | Freq: Once | INTRAMUSCULAR | Status: AC
Start: 2013-08-31 — End: 2013-08-31
  Administered 2013-08-31: 4 mg via INTRAVENOUS
  Filled 2013-08-31: qty 2

## 2013-08-31 MED ORDER — SODIUM CHLORIDE 0.9 % IV SOLN
Freq: Once | INTRAVENOUS | Status: AC
  Administered 2013-08-31: 16:00:00 via INTRAVENOUS

## 2013-08-31 MED ORDER — ONDANSETRON 8 MG PO TBDP: 8.0000 mg | ORAL_TABLET | Freq: Three times a day (TID) | ORAL | Status: DC | PRN

## 2013-08-31 MED ORDER — MORPHINE SULFATE 4 MG/ML IJ SOLN
4.0000 mg | Freq: Once | INTRAMUSCULAR | Status: AC
  Administered 2013-08-31: 4 mg via INTRAVENOUS
  Filled 2013-08-31: qty 1

## 2013-08-31 MED ORDER — ONDANSETRON HCL 4 MG/2ML IJ SOLN
4.0000 mg | Freq: Once | INTRAMUSCULAR | Status: AC
  Administered 2013-08-31: 4 mg via INTRAVENOUS
  Filled 2013-08-31: qty 2

## 2013-08-31 NOTE — ED Notes (Signed)
Pt remains in ultrasound at this time.

## 2013-08-31 NOTE — Discharge Instructions (Signed)
Go to women[facility] hospital if you develop abdominal pain with vaginal bleeding or for any other problems. Your doctor's office will call you tomorrow to schedule a repeat pregnancy blood test this week Hyperemesis Gravidarum Hyperemesis gravidarum is a severe form of nausea and vomiting that happens during pregnancy. Hyperemesis is worse than morning sickness. It may cause you to have nausea or vomiting all day for many days. It may keep you from eating and drinking enough food and liquids. Hyperemesis usually occurs during the first half (the first 20 weeks) of pregnancy. It often goes away once a woman is in her second half of pregnancy. However, sometimes hyperemesis continues through an entire pregnancy.  CAUSES  The cause of this condition is not completely known but is thought to be related to changes in the body's hormones when pregnant. It could be from the high level of the pregnancy hormone or an increase in estrogen in the body.  SIGNS AND SYMPTOMS   Severe nausea and vomiting.  Nausea that does not go away.  Vomiting that does not allow you to keep any food down.  Weight loss and body fluid loss (dehydration).  Having no desire to eat or not liking food you have previously enjoyed. DIAGNOSIS  Your health care provider will do a physical exam and ask you about your symptoms. He or she may also order blood tests and urine tests to make sure something else is not causing the problem.  TREATMENT  You may only need medicine to control the problem. If medicines do not control the nausea and vomiting, you will be treated in the hospital to prevent dehydration, increased acid in the blood (acidosis), weight loss, and changes in the electrolytes in your body that may harm the unborn baby (fetus). You may need IV fluids.  HOME CARE INSTRUCTIONS   Only take over-the-counter or prescription medicines as directed by your health care provider.  Try eating a couple of dry crackers or toast in the  morning before getting out of bed.  Avoid foods and smells that upset your stomach.  Avoid fatty and spicy foods.  Eat 5 6 small meals a day.  Do not drink when eating meals. Drink between meals.  For snacks, eat high-protein foods, such as cheese.  Eat or suck on things that have ginger in them. Ginger helps nausea.  Avoid food preparation. The smell of food can spoil your appetite.  Avoid iron pills and iron in your multivitamins until after 3 4 months of being pregnant. However, consult with your health care provider before stopping any prescribed iron pills. SEEK MEDICAL CARE IF:   Your abdominal pain increases.  You have a severe headache.  You have vision problems.  You are losing weight. SEEK IMMEDIATE MEDICAL CARE IF:   You are unable to keep fluids down.  You vomit blood.  You have constant nausea and vomiting.  You have excessive weakness.  You have extreme thirst.  You have dizziness or fainting.  You have a fever or persistent symptoms for more than 2 3 days.  You have a fever and your symptoms suddenly get worse. MAKE SURE YOU:   Understand these instructions.  Will watch your condition.  Will get help right away if you are not doing well or get worse. Document Released: 04/22/2005 Document Revised: 02/10/2013 Document Reviewed: 12/02/2012 [facility] Patient Information 2014 [place].

## 2013-08-31 NOTE — ED Notes (Addendum)
N/v/d since this am and she is hyperventilating LMP 3/28 ? preg

## 2013-08-31 NOTE — ED Notes (Signed)
Pt returned from ultrasound

## 2013-08-31 NOTE — ED Notes (Signed)
Pt very anxious, unable to lay still due to pain.

## 2013-08-31 NOTE — ED Provider Notes (Signed)
CSN: [id number]     Arrival date & time 08/31/13  1443 History   First MD Initiated Contact with Patient 08/31/13 1542     Chief Complaint  Patient presents with  . Emesis     (Consider location/radiation/quality/duration/timing/severity/associated sxs/prior Treatment) Patient is a 23 y.o. female presenting with vomiting. The history is provided by the patient.  Emesis  patient here complaining of nausea and vomiting without fever or chills. last menstrual period was March 28. Impression test is positive here. She is a G2 P0 Ab1. Denies any vaginal bleeding or discharge. No dysuria or hematuria. Her emesis has been nonbilious. Patient denies any weakness or syncope. No treatment used prior to arrival. Nothing makes her symptoms better or worse.  Past Medical History  Diagnosis Date  . H/O varicella   . Breast mass in female 01/21/06  . 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 Irregular menstrual cycle 07/28/07  . Vaginal discharge 10/01/07  . Vaginitis   . BV (bacterial vaginosis) 5.10/11  . History of chlamydia   . Candida vaginitis 04/26/10  . Breast nodule 04/26/10    Left  . Fibroadenoma   . Ectopic pregnancy 03/24/2013   Past Surgical History  Procedure Laterality Date  . Breast cyst excision Left 2007  . Left salpingectomy  03/24/2013    Ectopic Pregnancy  . Laparoscopy N/A 03/24/2013    Procedure: LAPAROSCOPY OPERATIVE;  Surgeon: Betsy Coder, MD;  Location: [facility];  Service: Gynecology;  Laterality: N/A;  . Unilateral salpingectomy Left 03/24/2013    Procedure: UNILATERAL SALPINGECTOMY;  Surgeon: Betsy Coder, MD;  Location: [facility];  Service: Gynecology;  Laterality: Left;   Family History  Problem Relation Age of Onset  . Asthma Maternal Grandmother   . Hypertension Maternal Grandmother   . Diabetes Maternal Grandmother   . Hypertension Paternal Grandfather   . Diabetes Paternal Grandfather    History  Substance Use Topics   . Smoking status: Former Research scientist (life sciences)  . Smokeless tobacco: Not on file  . Alcohol Use: No   OB History   Grav Para Term Preterm Abortions TAB SAB Ect Mult Living   2 0   1   1       Review of Systems  Gastrointestinal: Positive for vomiting.  All other systems reviewed and are negative.     Allergies  Penicillins  Home Medications   Prior to Admission medications   Not on File   BP 115/75  Pulse 96  Temp(Src) 97.5 F (36.4 C)  Resp 24  SpO2 99%  LMP 01/27/2013 Physical Exam  Nursing note and vitals reviewed. Constitutional: She is oriented to person, place, and time. She appears well-developed and well-nourished.  Non-toxic appearance. No distress.  HENT:  Head: Normocephalic and atraumatic.  Eyes: Conjunctivae, EOM and lids are normal. Pupils are equal, round, and reactive to light.  Neck: Normal range of motion. Neck supple. No tracheal deviation present. No mass present.  Cardiovascular: Normal rate, regular rhythm and normal heart sounds.  Exam reveals no gallop.   No murmur heard. Pulmonary/Chest: Effort normal and breath sounds normal. No stridor. No respiratory distress. She has no decreased breath sounds. She has no wheezes. She has no rhonchi. She has no rales.  Abdominal: Soft. Normal appearance and bowel sounds are normal. She exhibits no distension. There is no tenderness. There is no rigidity, no rebound, no guarding and no CVA tenderness.  Musculoskeletal: Normal range of motion. She exhibits no  edema and no tenderness.  Neurological: She is alert and oriented to person, place, and time. She has normal strength. No cranial nerve deficit or sensory deficit. GCS eye subscore is 4. GCS verbal subscore is 5. GCS motor subscore is 6.  Skin: Skin is warm and dry. No abrasion and no rash noted.  Psychiatric: She has a normal mood and affect. Her speech is normal and behavior is normal.    ED Course  Procedures (including critical care time) Labs Review Labs  Reviewed  POC URINE PREG, ED - Abnormal; Notable for the following:    Preg Test, Ur POSITIVE (*)    All other components within normal limits  CBC WITH DIFFERENTIAL  COMPREHENSIVE METABOLIC PANEL  LIPASE, BLOOD  URINALYSIS, ROUTINE W REFLEX MICROSCOPIC  HCG, QUANTITATIVE, PREGNANCY    Imaging Review No results found.   EKG Interpretation None      MDM   Final diagnoses:  None    Spoke with patient's GYN APP and patient to be seen in the office this week for a repeat beta hCG. She has no abdominal pain at this time concerning for ectopic pregnancy. She has no vaginal bleeding or discharge. She has an abdominal wall pain and was treated with pain meds and feels better. Ectopic pregnancy precautions given    Leota Jacobsen, MD 08/31/13 2013

## 2013-08-31 NOTE — ED Notes (Signed)
Pt to ultrasound at this time, via stretcher.

## 2013-09-01 ENCOUNTER — Encounter (HOSPITAL_COMMUNITY): Payer: Self-pay | Admitting: *Deleted

## 2013-09-01 ENCOUNTER — Inpatient Hospital Stay (HOSPITAL_COMMUNITY)
Admission: AD | Admit: 2013-09-01 | Discharge: 2013-09-02 | DRG: 781 | Disposition: A | Discharge status: Home / Self Care | Payer: BC Managed Care – PPO | Source: Ambulatory Visit | Attending: Obstetrics and Gynecology | Admitting: Obstetrics and Gynecology

## 2013-09-01 DIAGNOSIS — O0910 Supervision of pregnancy with history of ectopic or molar pregnancy, unspecified trimester: Secondary | ICD-10-CM

## 2013-09-01 DIAGNOSIS — O210 Mild hyperemesis gravidarum: Principal | ICD-10-CM | POA: Diagnosis present

## 2013-09-01 DIAGNOSIS — Z87891 Personal history of nicotine dependence: Secondary | ICD-10-CM

## 2013-09-01 DIAGNOSIS — O219 Vomiting of pregnancy, unspecified: Secondary | ICD-10-CM

## 2013-09-01 DIAGNOSIS — R1110 Vomiting, unspecified: Secondary | ICD-10-CM | POA: Diagnosis present

## 2013-09-01 DIAGNOSIS — O009 Unspecified ectopic pregnancy without intrauterine pregnancy: Secondary | ICD-10-CM | POA: Diagnosis not present

## 2013-09-01 DIAGNOSIS — Z833 Family history of diabetes mellitus: Secondary | ICD-10-CM

## 2013-09-01 DIAGNOSIS — R1013 Epigastric pain: Secondary | ICD-10-CM | POA: Diagnosis present

## 2013-09-01 DIAGNOSIS — Z880 Allergy status to penicillin: Secondary | ICD-10-CM

## 2013-09-01 LAB — TSH: TSH: 1.69 u[IU]/mL (ref 0.350–4.500)

## 2013-09-01 MED ORDER — ACETAMINOPHEN 650 MG RE SUPP: 650.0000 mg | Freq: Four times a day (QID) | RECTAL | Status: DC | PRN

## 2013-09-01 MED ORDER — PROMETHAZINE HCL 12.5 MG PO TABS: 12.5000 mg | ORAL_TABLET | Freq: Four times a day (QID) | ORAL | Status: DC | PRN

## 2013-09-01 MED ORDER — PROMETHAZINE HCL 25 MG/ML IJ SOLN
12.5000 mg | Freq: Once | INTRAMUSCULAR | Status: AC
  Administered 2013-09-01: 12.5 mg via INTRAVENOUS
  Filled 2013-09-01: qty 1

## 2013-09-01 MED ORDER — LACTATED RINGERS IV SOLN
INTRAVENOUS | Status: DC
  Administered 2013-09-01 – 2013-09-02 (×5): via INTRAVENOUS

## 2013-09-01 MED ORDER — ONDANSETRON 8 MG/NS 50 ML IVPB
8.0000 mg | Freq: Once | INTRAVENOUS | Status: DC
  Administered 2013-09-01: 8 mg via INTRAVENOUS
  Filled 2013-09-01: qty 8

## 2013-09-01 MED ORDER — PROMETHAZINE HCL 25 MG/ML IJ SOLN: 12.5000 mg | Freq: Once | INTRAMUSCULAR | Status: DC

## 2013-09-01 MED ORDER — PROMETHAZINE HCL 25 MG RE SUPP: 25.0000 mg | Freq: Four times a day (QID) | RECTAL | Status: DC | PRN

## 2013-09-01 MED ORDER — PROMETHAZINE HCL 25 MG/ML IJ SOLN
25.0000 mg | Freq: Four times a day (QID) | INTRAMUSCULAR | Status: DC
Start: 2013-09-01 — End: 2013-09-02
  Administered 2013-09-01 – 2013-09-02 (×3): 25 mg via INTRAVENOUS
  Filled 2013-09-01 (×3): qty 1

## 2013-09-01 MED ORDER — METOCLOPRAMIDE HCL 5 MG/ML IJ SOLN
10.0000 mg | Freq: Once | INTRAMUSCULAR | Status: AC
  Administered 2013-09-01: 10 mg via INTRAVENOUS
  Filled 2013-09-01: qty 2

## 2013-09-01 MED ORDER — PROMETHAZINE HCL 25 MG/ML IJ SOLN
25.0000 mg | Freq: Once | INTRAVENOUS | Status: AC
  Administered 2013-09-01: 25 mg via INTRAVENOUS
  Filled 2013-09-01: qty 1

## 2013-09-01 MED ORDER — HYDROMORPHONE HCL PF 1 MG/ML IJ SOLN
1.0000 mg | INTRAMUSCULAR | Status: DC | PRN
  Administered 2013-09-01: 2 mg via INTRAVENOUS
  Administered 2013-09-02: 1 mg via INTRAVENOUS
  Filled 2013-09-01: qty 1
  Filled 2013-09-01 (×2): qty 2

## 2013-09-01 MED ORDER — FAMOTIDINE IN NACL 20-0.9 MG/50ML-% IV SOLN
20.0000 mg | Freq: Once | INTRAVENOUS | Status: AC
  Administered 2013-09-01: 20 mg via INTRAVENOUS
  Filled 2013-09-01: qty 50

## 2013-09-01 NOTE — H&P (Signed)
Debbie Hughes is a 23 y.o. female, G3P0010 at [redacted]w[redacted]d, presenting for severe N/V x 1 day.  Pt was given Zofran yesterday at [facility] ED, but reports no relief.  Pt reports 2 episode of loose stool yesterday.  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  Pt reports upper epigastric abdominal pain.  No reported fever.  [place] yesterday showed no IUP or ectopic.  Pt was scheduled to f/u with a HCG at [facility] tomorrow.  Labs from 4/28 HCG 237 Lipase 27 CMET  Na 136 K 3.6 CO2 16 Glucose 115 Total protein 8.6 CBC WBC 13.5 UA Ketones > 80 Protein 100 Leuk Trace Few bacteria  Operative laparoscopy with removal of left ectopic pregnancy - 03/24/14  There are no active problems to display for this patient.   OB History   Grav Para Term Preterm Abortions TAB SAB Ect Mult Living   3 0   1   1       Past Medical History  Diagnosis Date  . H/O varicella   . Breast mass in female 01/21/06  . 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 Irregular menstrual cycle 07/28/07  . Vaginal discharge 10/01/07  . Vaginitis   . BV (bacterial vaginosis) 5.10/11  . History of chlamydia   . Candida vaginitis 04/26/10  . Breast nodule 04/26/10    Left  . Fibroadenoma   . Ectopic pregnancy 03/24/2013   Past Surgical History  Procedure Laterality Date  . Breast cyst excision Left 2007  . Left salpingectomy  03/24/2013    Ectopic Pregnancy  . Laparoscopy N/A 03/24/2013    Procedure: LAPAROSCOPY OPERATIVE;  Surgeon: Betsy Coder, MD;  Location: [facility];  Service: Gynecology;  Laterality: N/A;  . Unilateral salpingectomy Left 03/24/2013    Procedure: UNILATERAL SALPINGECTOMY;  Surgeon: Betsy Coder, MD;  Location: [facility];  Service: Gynecology;  Laterality: Left;   Family History: family history includes Asthma in her maternal grandmother; Diabetes in her maternal grandmother and paternal grandfather; Hypertension in her maternal grandmother and  paternal grandfather. Social History:  reports that she has quit smoking. She does not have any smokeless tobacco history on file. She reports that she does not drink alcohol or use illicit drugs.      ROS: see HPI above, all other systems are negative   Allergies  Allergen Reactions  . Penicillins Rash       Blood pressure 118/73, pulse 98, temperature 98 F (36.7 C), temperature source Oral, resp. rate 22, last menstrual period 07/31/2013, unknown if currently breastfeeding.  Chest clear Heart RRR without murmur Abd NT, no guarding Ext: WNL   Prenatal labs: ABO, Rh: --/--/A POS, A POS (11/19 1850) Antibody: NEG (11/19 1850) Rubella:    Immune 11/02/12 RPR:   Neg 11/02/12 HBsAg:   Neg 11/02/12 HIV:   neg 11/02/12 Sickle cell/Hgb electrophoresis:  Normal study 11/02/12 GC:  Neg 03/23/13 Chlamydia:  Neg 03/23/13     Assessment [redacted] weeks pregnant with N/V HCG 237  Admit to [facility] per c/w Dr. Charlesetta Garibaldi CBC with Diff, CMET, HCG tomorrow am US abdomen limited in am NPO Tylenol PR supp 650 mg for temp Dilaudid 1-2 mg for pain Phenergan 25 mg Q 6 hours for nausea   Raelene Bott, MSN 09/01/2013, 4:12 PM

## 2013-09-01 NOTE — MAU Provider Note (Deleted)
Attestation of Attending Supervision of Advanced Practitioner (PA/CNM/NP): Evaluation and management procedures were performed by the Advanced Practitioner under my supervision and collaboration.  I have reviewed the Advanced Practitioner's note and chart, and I agree with the management and plan.  Mayara Paulson, MD, FACOG Attending Obstetrician & Gynecologist Faculty Practice, [facility]  

## 2013-09-01 NOTE — Progress Notes (Signed)
Patient very anxious.  Unable to lay still due to abdominal cramping.  VSS, no vaginal bleeding.  Linda Hedges, CNMW notified and in to see patient.

## 2013-09-01 NOTE — MAU Note (Signed)
Pt vomiting violently.  Unable to void, taken directly to rm.

## 2013-09-01 NOTE — MAU Provider Note (Addendum)
History     CSN: [id number]  Arrival date and time: 09/01/13 1050   First Provider Initiated Contact with Patient 09/01/13 1110      Chief Complaint  Patient presents with  . Morning Sickness   HPI Ms. Debbie Hughes is a 23 y.o. G3P0010 at [redacted]w[redacted]d who presents to MAU today with N/V since yesterday morning. LMP was 07/31/13. Patient was seen at [facility] yesterday and received 3 liters of IVF. Rx for Zofran was given at time of discharge. Patient states that she has tried it but it is not working. She denies diarrhea, constipation or fever. She does have abdominal pain associated with emesis. She had [place] yesterday that showed no IUP or ectopic and is scheduled for follow-up for serial quant hCGs with [facility] this week.   OB History   Grav Para Term Preterm Abortions TAB SAB Ect Mult Living   3 0   1   1        Past Medical History  Diagnosis Date  . H/O varicella   . Breast mass in female 01/21/06  . 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 Irregular menstrual cycle 07/28/07  . Vaginal discharge 10/01/07  . Vaginitis   . BV (bacterial vaginosis) 5.10/11  . History of chlamydia   . Candida vaginitis 04/26/10  . Breast nodule 04/26/10    Left  . Fibroadenoma   . Ectopic pregnancy 03/24/2013    Past Surgical History  Procedure Laterality Date  . Breast cyst excision Left 2007  . Left salpingectomy  03/24/2013    Ectopic Pregnancy  . Laparoscopy N/A 03/24/2013    Procedure: LAPAROSCOPY OPERATIVE;  Surgeon: Betsy Coder, MD;  Location: [facility];  Service: Gynecology;  Laterality: N/A;  . Unilateral salpingectomy Left 03/24/2013    Procedure: UNILATERAL SALPINGECTOMY;  Surgeon: Betsy Coder, MD;  Location: [facility];  Service: Gynecology;  Laterality: Left;    Family History  Problem Relation Age of Onset  . Asthma Maternal Grandmother   . Hypertension Maternal Grandmother   . Diabetes Maternal Grandmother   . Hypertension Paternal Grandfather    . Diabetes Paternal Grandfather     History  Substance Use Topics  . Smoking status: Former Research scientist (life sciences)  . Smokeless tobacco: Not on file  . Alcohol Use: No    Allergies:  Allergies  Allergen Reactions  . Penicillins Rash    Prescriptions prior to admission  Medication Sig Dispense Refill  . ondansetron (ZOFRAN ODT) 8 MG disintegrating tablet Take 1 tablet (8 mg total) by mouth every 8 (eight) hours as needed for nausea or vomiting.  20 tablet  0    Review of Systems  Constitutional: Negative for fever and malaise/fatigue.  Gastrointestinal: Positive for nausea, vomiting and abdominal pain. Negative for diarrhea and constipation.   Physical Exam   Blood pressure 118/73, pulse 98, temperature 98 F (36.7 C), temperature source Oral, resp. rate 22, last menstrual period 01/27/2013, unknown if currently breastfeeding.  Physical Exam  Constitutional: She is oriented to person, place, and time. She appears well-developed and well-nourished. She appears distressed.  Patient is actively vomiting  HENT:  Head: Normocephalic and atraumatic.  Cardiovascular: Normal rate.   Respiratory: Effort normal.  GI: Soft. She exhibits no distension and no mass. Bowel sounds are decreased. There is no tenderness. There is no rebound and no guarding.  Neurological: She is alert and oriented to person, place, and time.  Skin: Skin is warm and dry.  No erythema.  Psychiatric: She has a normal mood and affect.   Results for orders placed during the hospital encounter of 08/31/13 (from the past 24 hour(s))  CBC WITH DIFFERENTIAL     Status: Abnormal   Collection Time    08/31/13  2:58 PM      Result Value Ref Range   WBC 13.5 (*) 4.0 - 10.5 K/uL   RBC 4.18  3.87 - 5.11 MIL/uL   Hemoglobin 13.7  12.0 - 15.0 g/dL   HCT 38.5  36.0 - 46.0 %   MCV 92.1  78.0 - 100.0 fL   MCH 32.8  26.0 - 34.0 pg   MCHC 35.6  30.0 - 36.0 g/dL   RDW 11.5  11.5 - 15.5 %   Platelets 232  150 - 400 K/uL   Neutrophils  Relative % 93 (*) 43 - 77 %   Neutro Abs 12.6 (*) 1.7 - 7.7 K/uL   Lymphocytes Relative 5 (*) 12 - 46 %   Lymphs Abs 0.6 (*) 0.7 - 4.0 K/uL   Monocytes Relative 2 (*) 3 - 12 %   Monocytes Absolute 0.2  0.1 - 1.0 K/uL   Eosinophils Relative 0  0 - 5 %   Eosinophils Absolute 0.0  0.0 - 0.7 K/uL   Basophils Relative 0  0 - 1 %   Basophils Absolute 0.0  0.0 - 0.1 K/uL  COMPREHENSIVE METABOLIC PANEL     Status: Abnormal   Collection Time    08/31/13  2:58 PM      Result Value Ref Range   Sodium 136 (*) 137 - 147 mEq/L   Potassium 3.6 (*) 3.7 - 5.3 mEq/L   Chloride 99  96 - 112 mEq/L   CO2 16 (*) 19 - 32 mEq/L   Glucose, Bld 115 (*) 70 - 99 mg/dL   BUN 15  6 - 23 mg/dL   Creatinine, Ser 0.58  0.50 - 1.10 mg/dL   Calcium 10.1  8.4 - 10.5 mg/dL   Total Protein 8.6 (*) 6.0 - 8.3 g/dL   Albumin 4.6  3.5 - 5.2 g/dL   AST 31  0 - 37 U/L   ALT 20  0 - 35 U/L   Alkaline Phosphatase 71  39 - 117 U/L   Total Bilirubin 0.8  0.3 - 1.2 mg/dL   GFR calc non Af Amer >90  >90 mL/min   GFR calc Af Amer >90  >90 mL/min  LIPASE, BLOOD     Status: None   Collection Time    08/31/13  2:58 PM      Result Value Ref Range   Lipase 27  11 - 59 U/L  HCG, QUANTITATIVE, PREGNANCY     Status: Abnormal   Collection Time    08/31/13  2:58 PM      Result Value Ref Range   hCG, Beta Chain, Quant, S 237 (*) <5 mIU/mL  URINALYSIS, ROUTINE W REFLEX MICROSCOPIC     Status: Abnormal   Collection Time    08/31/13  3:10 PM      Result Value Ref Range   Color, Urine YELLOW  YELLOW   APPearance CLOUDY (*) CLEAR   Specific Gravity, Urine 1.030  1.005 - 1.030   pH 8.0  5.0 - 8.0   Glucose, UA NEGATIVE  NEGATIVE mg/dL   Hgb urine dipstick NEGATIVE  NEGATIVE   Bilirubin Urine NEGATIVE  NEGATIVE   Ketones, ur >80 (*) NEGATIVE mg/dL   Protein, ur  100 (*) NEGATIVE mg/dL   Urobilinogen, UA 0.2  0.0 - 1.0 mg/dL   Nitrite NEGATIVE  NEGATIVE   Leukocytes, UA TRACE (*) NEGATIVE  URINE MICROSCOPIC-ADD ON     Status:  Abnormal   Collection Time    08/31/13  3:10 PM      Result Value Ref Range   Squamous Epithelial / LPF RARE  RARE   WBC, UA 0-2  <3 WBC/hpf   RBC / HPF 0-2  <3 RBC/hpf   Bacteria, UA FEW (*) RARE  POC URINE PREG, ED     Status: Abnormal   Collection Time    08/31/13  3:17 PM      Result Value Ref Range   Preg Test, Ur POSITIVE (*) NEGATIVE    MAU Course  Procedures None  MDM Labs from [facility] above Unable to give urine sample today 25 mg Phenergan infusion in 1 liter D5LR given in MAU Patient reports significant improvement in N/V and abdominal pain. No additional emesis in MAU today Assessment and Plan  A: Nausea and vomiting in pregnancy prior to [redacted] weeks gestation  P: Discharge home Rx for Phenergan PO and suppositories sent to patients pharmacy Patient advised to increase PO hydration and advance diet as tolerated Ectopic precautions discussed Patient encouraged to follow-up with [facility] as scheduled Patient may return to MAU as needed or if her condition were to change or worsen  Farris Has 09/01/2013, 11:11 AM   MDM Just prior to discharge patient began having N/V again.  10 mg IV Reglan given. Continued N/V Discussed patient with Dr. Harolyn Rutherford. Ok to given 12.5 mg Phenergan again and also IV Zofran at this time.  Per Dr. Harolyn Rutherford, [facility] will resume care of this patient.  1600 - Care turned over to [facility], CNM  Farris Has, PA-C 09/01/2013 3:57 PM

## 2013-09-02 ENCOUNTER — Inpatient Hospital Stay (HOSPITAL_COMMUNITY): Payer: BC Managed Care – PPO

## 2013-09-02 LAB — CBC WITH DIFFERENTIAL/PLATELET
BASOS ABS: 0 10*3/uL (ref 0.0–0.1)
Basophils Relative: 0 % (ref 0–1)
Eosinophils Absolute: 0 10*3/uL (ref 0.0–0.7)
Eosinophils Relative: 0 % (ref 0–5)
HCT: 31.8 % — ABNORMAL LOW (ref 36.0–46.0)
Hemoglobin: 11 g/dL — ABNORMAL LOW (ref 12.0–15.0)
LYMPHS PCT: 29 % (ref 12–46)
Lymphs Abs: 2.7 10*3/uL (ref 0.7–4.0)
MCH: 32.4 pg (ref 26.0–34.0)
MCHC: 34.6 g/dL (ref 30.0–36.0)
MCV: 93.8 fL (ref 78.0–100.0)
Monocytes Absolute: 0.5 10*3/uL (ref 0.1–1.0)
Monocytes Relative: 6 % (ref 3–12)
NEUTROS ABS: 6 10*3/uL (ref 1.7–7.7)
Neutrophils Relative %: 65 % (ref 43–77)
PLATELETS: 181 10*3/uL (ref 150–400)
RBC: 3.39 MIL/uL — ABNORMAL LOW (ref 3.87–5.11)
RDW: 11.9 % (ref 11.5–15.5)
WBC: 9.2 10*3/uL (ref 4.0–10.5)

## 2013-09-02 LAB — COMPREHENSIVE METABOLIC PANEL
ALT: 15 U/L (ref 0–35)
AST: 23 U/L (ref 0–37)
Albumin: 3 g/dL — ABNORMAL LOW (ref 3.5–5.2)
Alkaline Phosphatase: 45 U/L (ref 39–117)
BILIRUBIN TOTAL: 0.6 mg/dL (ref 0.3–1.2)
BUN: 6 mg/dL (ref 6–23)
CHLORIDE: 107 meq/L (ref 96–112)
CO2: 26 meq/L (ref 19–32)
Calcium: 8.5 mg/dL (ref 8.4–10.5)
Creatinine, Ser: 0.68 mg/dL (ref 0.50–1.10)
GFR calc Af Amer: >90 mL/min (ref >90)
Glucose, Bld: 93 mg/dL (ref 70–99)
Potassium: 3.2 mEq/L — ABNORMAL LOW (ref 3.7–5.3)
SODIUM: 143 meq/L (ref 137–147)
Total Protein: 5.4 g/dL — ABNORMAL LOW (ref 6.0–8.3)

## 2013-09-02 LAB — HCG, QUANTITATIVE, PREGNANCY: hCG, Beta Chain, Quant, S: 467 m[IU]/mL — ABNORMAL HIGH (ref <5)

## 2013-09-02 MED ORDER — DOCUSATE SODIUM 100 MG PO CAPS: 100.0000 mg | ORAL_CAPSULE | Freq: Two times a day (BID) | ORAL | Status: DC

## 2013-09-02 MED ORDER — HYDROMORPHONE HCL 2 MG PO TABS: 2.0000 mg | ORAL_TABLET | ORAL | Status: DC | PRN

## 2013-09-02 MED ORDER — POTASSIUM CHLORIDE CRYS ER 20 MEQ PO TBCR
40.0000 meq | EXTENDED_RELEASE_TABLET | Freq: Once | ORAL | Status: AC
  Administered 2013-09-02: 40 meq via ORAL
  Filled 2013-09-02: qty 2

## 2013-09-02 NOTE — Discharge Instructions (Signed)
Hyperemesis Gravidarum Diet Hyperemesis gravidarum is a severe form of morning sickness. It is characterized by frequent and severe vomiting. It happens during the first trimester of pregnancy. It may be caused by the rapid hormone changes that happen during pregnancy. It is associated with a 5% weight loss of pre-pregnancy weight. The hyperemesis diet may be used to lessen symptoms of nausea and vomiting. EATING GUIDELINES  Eat 5 to 6 small meals daily instead of 3 large meals.  Avoid foods with strong smells.  Avoid drinking 30 minutes before and after meals.  Avoid fried or high-fat foods, such as butter and cream sauces.  Starchy foods are usually well-tolerated, such as cereal, toast, bread, potatoes, pasta, rice, and pretzels.  Eat crackers before you get out of bed in the morning.  Avoid spicy foods.  Ginger may help with nausea. Add  tsp ginger to hot tea or choose ginger tea.  Continue to take your prenatal vitamins as directed by your caregiver. SAMPLE MEAL PLAN Breakfast    cup oatmeal  1 slice toast  1 tsp heart-healthy margarine  1 tsp jelly  1 scrambled egg Midmorning Snack   1 cup low-fat yogurt Lunch   Plain ham sandwich  Carrot or celery sticks  1 small apple  3 graham crackers Midafternoon Snack   Cheese and crackers Dinner  4 oz pork tenderloin  1 small baked potato  1 tsp margarine   cup broccoli   cup grapes Evening Snack  1 cup pudding Document Released: 02/17/2007 Document Revised: 07/15/2011 Document Reviewed: 09/22/2012 [employer] Patient Information 2014 [place], [place].  Morning Sickness Morning sickness is when you feel sick to your stomach (nauseous) during pregnancy. You may feel sick to your stomach and throw up (vomit). You may feel sick in the morning, but you can feel this way any time of day. Some women feel very sick to their stomach and cannot stop throwing up (hyperemesis gravidarum). HOME CARE  Only take  medicines as told by your doctor.  Take multivitamins as told by your doctor. Taking multivitamins before getting pregnant can stop or lessen the harshness of morning sickness.  Eat dry toast or unsalted crackers before getting out of bed.  Eat 5 to 6 small meals a day.  Eat dry and bland foods like rice and baked potatoes.  Do not drink liquids with meals. Drink between meals.  Do not eat greasy, fatty, or spicy foods.  Have someone cook for you if the smell of food causes you to feel sick or throw up.  If you feel sick to your stomach after taking prenatal vitamins, take them at night or with a snack.  Eat protein when you need a snack (nuts, yogurt, cheese).  Eat unsweetened gelatins for dessert.  Wear a bracelet used for sea sickness (acupressure wristband).  Go to a doctor that puts thin needles into certain body points (acupuncture) to improve how you feel.  Do not smoke.  Use a humidifier to keep the air in your house free of odors.  Get lots of fresh air. GET HELP IF:  You need medicine to feel better.  You feel dizzy or lightheaded.  You are losing weight. GET HELP RIGHT AWAY IF:   You feel very sick to your stomach and cannot stop throwing up.  You pass out (faint). Document Released: 05/30/2004 Document Revised: 12/23/2012 Document Reviewed: 10/07/2012 [facility] Patient Information 2014 [place].  Nausea, Adult Nausea is the feeling that you have an upset stomach or have  to vomit. Nausea by itself is not likely a serious concern, but it may be an early sign of more serious medical problems. As nausea gets worse, it can lead to vomiting. If vomiting develops, there is the risk of dehydration.  CAUSES   Viral infections.  Food poisoning.  Medicines.  Pregnancy.  Motion sickness.  Migraine headaches.  Emotional distress.  Severe pain from any source.  Alcohol intoxication. HOME CARE INSTRUCTIONS  Get plenty of rest.  Ask your  caregiver about specific rehydration instructions.  Eat small amounts of food and sip liquids more often.  Take all medicines as told by your caregiver. SEEK MEDICAL CARE IF:  You have not improved after 2 days, or you get worse.  You have a headache. SEEK IMMEDIATE MEDICAL CARE IF:   You have a fever.  You faint.  You keep vomiting or have blood in your vomit.  You are extremely weak or dehydrated.  You have dark or bloody stools.  You have severe chest or abdominal pain. MAKE SURE YOU:  Understand these instructions.  Will watch your condition.  Will get help right away if you are not doing well or get worse. Document Released: 05/30/2004 Document Revised: 01/15/2012 Document Reviewed: 01/02/2011 [facility] Patient Information 2014 [place], [place].

## 2013-09-02 NOTE — Discharge Summary (Signed)
Physician Discharge Summary  Patient ID: Debbie Hughes MRN: [id number] DOB/AGE: 1990/12/20 23 y.o.  Admit date: 09/01/2013 Discharge date: 09/02/2013  Admission Diagnoses: gastroenteritis Pregnancy at 4 weeks  Discharge Diagnoses:  Active Problems:   Vomiting   H/o Ectopic pregnancy   Discharged Condition: good  Hospital Course: Pt was admitted with nausea and vommiting refractory to out patient treatment.  She is now tolerating a bland diet and will be sent home with antiemetics.  Pt has history of ectopic pregnancy.  Will need a BHCg on 5/2 and q 2days until greater than 1500.  At 1500 pt will need an [place]  Consults: None  Significant Diagnostic Studies: see labs.  Abdominal US of GB was normal  Treatments: IV hydration and antiemetics  Discharge Exam: Blood pressure 108/70, pulse 74, temperature 98.3 F (36.8 C), temperature source Oral, resp. rate 20, height 5\' 2"  (1.575 m), weight 50.814 kg (112 lb 0.4 oz), last menstrual period 07/31/2013, SpO2 98.00%, unknown if currently breastfeeding. General appearance: alert and cooperative Resp: clear to auscultation bilaterally Cardio: regular rate and rhythm Extremities: extremities normal, atraumatic, no cyanosis or edema  Disposition: 01-Home or Self Care  Discharge Orders   Future Orders Complete By Expires   Discharge patient  As directed        Medication List         docusate sodium 100 MG capsule  Commonly known as:  COLACE  Take 1 capsule (100 mg total) by mouth 2 (two) times daily.     HYDROmorphone 2 MG tablet  Commonly known as:  DILAUDID  Take 1 tablet (2 mg total) by mouth every 4 (four) hours as needed for severe pain.     ondansetron 8 MG disintegrating tablet  Commonly known as:  ZOFRAN ODT  Take 1 tablet (8 mg total) by mouth every 8 (eight) hours as needed for nausea or vomiting.     promethazine 12.5 MG tablet  Commonly known as:  PHENERGAN  Take 1 tablet (12.5 mg total) by mouth every 6  (six) hours as needed for nausea or vomiting.     promethazine 25 MG suppository  Commonly known as:  PHENERGAN  Place 1 suppository (25 mg total) rectally every 6 (six) hours as needed for nausea or vomiting.           Follow-up Information   Follow up with [facility]. (As scheduled)    Contact information:   [address] [place] [address] [phone]      Follow up with [facility]. (If symptoms worsen, As needed)    Contact information:   [address] [id number] [place] [address] [phone]      Follow up On 09/04/2013. (for BHCG auant)       Signed: Jafeth Mustin A Carie Kapuscinski 09/02/2013, 11:59 AM

## 2013-09-02 NOTE — Progress Notes (Signed)
Hospital day # 1 pregnancy at [redacted]w[redacted]d--N/V with h/o ectopic pregnancy.  S:  Patient currently denies pain, N/V.  States that she is managing pain well with dilaudid, last dose at 0600.  Feels that the pain she experienced, this am, was residual and related to severe vomiting that caused muscle spasms.  States she has some nausea with ambulation, but managed well with zofran.  Requests diet be advanced to bland, soft.       O: BP 108/70  Pulse 74  Temp(Src) 98.3 F (36.8 C) (Oral)  Resp 20  Ht 5\' 2"  (1.575 m)  Wt 112 lb 0.4 oz (50.814 kg)  BMI 20.48 kg/m2  SpO2 98%  LMP 07/31/2013              Labs:   Results for orders placed during the hospital encounter of 09/01/13 (from the past 24 hour(s))  TSH     Status: None   Collection Time    09/01/13  5:47 PM      Result Value Ref Range   TSH 1.690  0.350 - 4.500 uIU/mL  CBC WITH DIFFERENTIAL     Status: Abnormal   Collection Time    09/02/13  6:00 AM      Result Value Ref Range   WBC 9.2  4.0 - 10.5 K/uL   RBC 3.39 (*) 3.87 - 5.11 MIL/uL   Hemoglobin 11.0 (*) 12.0 - 15.0 g/dL   HCT 31.8 (*) 36.0 - 46.0 %   MCV 93.8  78.0 - 100.0 fL   MCH 32.4  26.0 - 34.0 pg   MCHC 34.6  30.0 - 36.0 g/dL   RDW 11.9  11.5 - 15.5 %   Platelets 181  150 - 400 K/uL   Neutrophils Relative % 65  43 - 77 %   Neutro Abs 6.0  1.7 - 7.7 K/uL   Lymphocytes Relative 29  12 - 46 %   Lymphs Abs 2.7  0.7 - 4.0 K/uL   Monocytes Relative 6  3 - 12 %   Monocytes Absolute 0.5  0.1 - 1.0 K/uL   Eosinophils Relative 0  0 - 5 %   Eosinophils Absolute 0.0  0.0 - 0.7 K/uL   Basophils Relative 0  0 - 1 %   Basophils Absolute 0.0  0.0 - 0.1 K/uL  COMPREHENSIVE METABOLIC PANEL     Status: Abnormal   Collection Time    09/02/13  6:00 AM      Result Value Ref Range   Sodium 143  137 - 147 mEq/L   Potassium 3.2 (*) 3.7 - 5.3 mEq/L   Chloride 107  96 - 112 mEq/L   CO2 26  19 - 32 mEq/L   Glucose, Bld 93  70 - 99 mg/dL   BUN 6  6 - 23 mg/dL   Creatinine, Ser 0.68   0.50 - 1.10 mg/dL   Calcium 8.5  8.4 - 10.5 mg/dL   Total Protein 5.4 (*) 6.0 - 8.3 g/dL   Albumin 3.0 (*) 3.5 - 5.2 g/dL   AST 23  0 - 37 U/L   ALT 15  0 - 35 U/L   Alkaline Phosphatase 45  39 - 117 U/L   Total Bilirubin 0.6  0.3 - 1.2 mg/dL   GFR calc non Af Amer >90  >90 mL/min   GFR calc Af Amer >90  >90 mL/min  HCG, QUANTITATIVE, PREGNANCY     Status: Abnormal   Collection Time  09/02/13  9:00 AM      Result Value Ref Range   hCG, Beta Chain, Quant, S 467 (*) <5 mIU/mL        Meds:  Scheduled Meds: . promethazine  12.5 mg Intravenous Once  . promethazine  25 mg Intravenous Q6H   Continuous Infusions: . lactated ringers 125 mL/hr at 09/02/13 0204   PRN Meds:.acetaminophen, HYDROmorphone (DILAUDID) injection  A: [redacted]w[redacted]d with N/V H/O Ectopic Pregnancy Improving  P: Abdominal US completed-No significant findings -Continue current plan of care -Upcoming tests/treatments:  None -Dr. Sophronia Simas to follow  Gavin Pound CNM, MSN 09/02/2013 10:00 AM

## 2013-09-02 NOTE — Progress Notes (Signed)
Pt d/c home  Teaching complete   

## 2013-09-21 ENCOUNTER — Inpatient Hospital Stay (HOSPITAL_COMMUNITY): Payer: BC Managed Care – PPO

## 2013-09-21 ENCOUNTER — Inpatient Hospital Stay (HOSPITAL_COMMUNITY)
Admission: AD | Admit: 2013-09-21 | Discharge: 2013-09-23 | DRG: 781 | Disposition: A | Discharge status: Home / Self Care | Payer: BC Managed Care – PPO | Source: Ambulatory Visit | Attending: Obstetrics and Gynecology | Admitting: Obstetrics and Gynecology

## 2013-09-21 ENCOUNTER — Inpatient Hospital Stay (HOSPITAL_COMMUNITY)
Admission: AD | Admit: 2013-09-21 | Discharge: 2013-09-21 | DRG: 781 | Disposition: A | Discharge status: Home / Self Care | Payer: BC Managed Care – PPO | Source: Ambulatory Visit | Attending: Obstetrics and Gynecology | Admitting: Obstetrics and Gynecology

## 2013-09-21 ENCOUNTER — Encounter (HOSPITAL_COMMUNITY): Payer: Self-pay

## 2013-09-21 DIAGNOSIS — Z825 Family history of asthma and other chronic lower respiratory diseases: Secondary | ICD-10-CM

## 2013-09-21 DIAGNOSIS — O210 Mild hyperemesis gravidarum: Secondary | ICD-10-CM | POA: Diagnosis present

## 2013-09-21 DIAGNOSIS — Z833 Family history of diabetes mellitus: Secondary | ICD-10-CM

## 2013-09-21 DIAGNOSIS — O9989 Other specified diseases and conditions complicating pregnancy, childbirth and the puerperium: Principal | ICD-10-CM

## 2013-09-21 DIAGNOSIS — Z8742 Personal history of other diseases of the female genital tract: Secondary | ICD-10-CM

## 2013-09-21 DIAGNOSIS — O99891 Other specified diseases and conditions complicating pregnancy: Secondary | ICD-10-CM | POA: Diagnosis present

## 2013-09-21 DIAGNOSIS — Z87891 Personal history of nicotine dependence: Secondary | ICD-10-CM

## 2013-09-21 DIAGNOSIS — R1013 Epigastric pain: Secondary | ICD-10-CM | POA: Diagnosis present

## 2013-09-21 DIAGNOSIS — Z8759 Personal history of other complications of pregnancy, childbirth and the puerperium: Secondary | ICD-10-CM

## 2013-09-21 DIAGNOSIS — R1110 Vomiting, unspecified: Secondary | ICD-10-CM

## 2013-09-21 DIAGNOSIS — Z880 Allergy status to penicillin: Secondary | ICD-10-CM

## 2013-09-21 DIAGNOSIS — Z8249 Family history of ischemic heart disease and other diseases of the circulatory system: Secondary | ICD-10-CM

## 2013-09-21 LAB — URINALYSIS, DIPSTICK ONLY
BILIRUBIN URINE: NEGATIVE
Glucose, UA: NEGATIVE mg/dL
Ketones, ur: >80 mg/dL — AB
Leukocytes, UA: NEGATIVE
NITRITE: NEGATIVE
PH: 8.5 — AB (ref 5.0–8.0)
Protein, ur: 30 mg/dL — AB
Specific Gravity, Urine: 1.02 (ref 1.005–1.030)
UROBILINOGEN UA: 0.2 mg/dL (ref 0.0–1.0)

## 2013-09-21 LAB — COMPREHENSIVE METABOLIC PANEL
ALBUMIN: 3.7 g/dL (ref 3.5–5.2)
ALT: 14 U/L (ref 0–35)
AST: 19 U/L (ref 0–37)
Alkaline Phosphatase: 49 U/L (ref 39–117)
BUN: 6 mg/dL (ref 6–23)
CO2: 20 mEq/L (ref 19–32)
Calcium: 9.3 mg/dL (ref 8.4–10.5)
Chloride: 101 mEq/L (ref 96–112)
Creatinine, Ser: 0.6 mg/dL (ref 0.50–1.10)
GFR calc Af Amer: >90 mL/min (ref >90)
GFR calc non Af Amer: >90 mL/min (ref >90)
Glucose, Bld: 94 mg/dL (ref 70–99)
Potassium: 3.4 mEq/L — ABNORMAL LOW (ref 3.7–5.3)
SODIUM: 136 meq/L — AB (ref 137–147)
TOTAL PROTEIN: 6.8 g/dL (ref 6.0–8.3)
Total Bilirubin: 0.7 mg/dL (ref 0.3–1.2)

## 2013-09-21 LAB — URINALYSIS, ROUTINE W REFLEX MICROSCOPIC
Bilirubin Urine: NEGATIVE
GLUCOSE, UA: NEGATIVE mg/dL
HGB URINE DIPSTICK: NEGATIVE
Ketones, ur: 40 mg/dL — AB
Leukocytes, UA: NEGATIVE
Nitrite: NEGATIVE
PROTEIN: NEGATIVE mg/dL
Specific Gravity, Urine: <1.005 — ABNORMAL LOW (ref 1.005–1.030)
Urobilinogen, UA: 0.2 mg/dL (ref 0.0–1.0)
pH: 7 (ref 5.0–8.0)

## 2013-09-21 LAB — CBC WITH DIFFERENTIAL/PLATELET
Basophils Absolute: 0 10*3/uL (ref 0.0–0.1)
Basophils Absolute: 0 10*3/uL (ref 0.0–0.1)
Basophils Relative: 0 % (ref 0–1)
Basophils Relative: 0 % (ref 0–1)
EOS ABS: 0 10*3/uL (ref 0.0–0.7)
Eosinophils Absolute: 0 10*3/uL (ref 0.0–0.7)
Eosinophils Relative: 0 % (ref 0–5)
Eosinophils Relative: 0 % (ref 0–5)
HCT: 36 % (ref 36.0–46.0)
HCT: 33.7 % — ABNORMAL LOW (ref 36.0–46.0)
HEMOGLOBIN: 12.1 g/dL (ref 12.0–15.0)
Hemoglobin: 13.1 g/dL (ref 12.0–15.0)
LYMPHS ABS: 1.8 10*3/uL (ref 0.7–4.0)
Lymphocytes Relative: 13 % (ref 12–46)
Lymphocytes Relative: 20 % (ref 12–46)
Lymphs Abs: 1.3 10*3/uL (ref 0.7–4.0)
MCH: 33.2 pg (ref 26.0–34.0)
MCH: 33 pg (ref 26.0–34.0)
MCHC: 36.4 g/dL — ABNORMAL HIGH (ref 30.0–36.0)
MCHC: 35.9 g/dL (ref 30.0–36.0)
MCV: 91.4 fL (ref 78.0–100.0)
MCV: 91.8 fL (ref 78.0–100.0)
MONO ABS: 0.5 10*3/uL (ref 0.1–1.0)
MONOS PCT: 5 % (ref 3–12)
Monocytes Absolute: 0.4 10*3/uL (ref 0.1–1.0)
Monocytes Relative: 4 % (ref 3–12)
NEUTROS PCT: 75 % (ref 43–77)
Neutro Abs: 8 10*3/uL — ABNORMAL HIGH (ref 1.7–7.7)
Neutro Abs: 6.9 10*3/uL (ref 1.7–7.7)
Neutrophils Relative %: 83 % — ABNORMAL HIGH (ref 43–77)
Platelets: 250 10*3/uL (ref 150–400)
Platelets: 217 10*3/uL (ref 150–400)
RBC: 3.94 MIL/uL (ref 3.87–5.11)
RBC: 3.67 MIL/uL — ABNORMAL LOW (ref 3.87–5.11)
RDW: 11.3 % — ABNORMAL LOW (ref 11.5–15.5)
RDW: 11.3 % — ABNORMAL LOW (ref 11.5–15.5)
WBC: 9.8 10*3/uL (ref 4.0–10.5)
WBC: 9.2 10*3/uL (ref 4.0–10.5)

## 2013-09-21 LAB — TSH: TSH: 1.18 u[IU]/mL (ref 0.350–4.500)

## 2013-09-21 LAB — LIPASE, BLOOD: Lipase: 28 U/L (ref 11–59)

## 2013-09-21 LAB — AMYLASE: Amylase: 84 U/L (ref 0–105)

## 2013-09-21 LAB — HCG, QUANTITATIVE, PREGNANCY: hCG, Beta Chain, Quant, S: 40470 m[IU]/mL — ABNORMAL HIGH (ref <5)

## 2013-09-21 MED ORDER — METHYLPREDNISOLONE 4 MG PO TABS
8.0000 mg | ORAL_TABLET | Freq: Every day | ORAL | Status: DC
  Filled 2013-09-21: qty 2

## 2013-09-21 MED ORDER — METHYLPREDNISOLONE 16 MG PO TABS
16.0000 mg | ORAL_TABLET | Freq: Every day | ORAL | Status: AC
Start: 2013-09-22 — End: 2013-09-23
  Administered 2013-09-22 – 2013-09-23 (×2): 16 mg via ORAL
  Filled 2013-09-21 (×2): qty 1

## 2013-09-21 MED ORDER — DEXTROSE-NACL 5-0.45 % IV SOLN
INTRAVENOUS | Status: DC
  Administered 2013-09-21 – 2013-09-23 (×7): via INTRAVENOUS

## 2013-09-21 MED ORDER — HYDROMORPHONE HCL PF 1 MG/ML IJ SOLN: 1.0000 mg | INTRAMUSCULAR | Status: DC | PRN

## 2013-09-21 MED ORDER — ZOLPIDEM TARTRATE 5 MG PO TABS: 5.0000 mg | ORAL_TABLET | Freq: Every evening | ORAL | Status: DC | PRN

## 2013-09-21 MED ORDER — METHYLPREDNISOLONE 4 MG PO TABS: 4.0000 mg | ORAL_TABLET | Freq: Every day | ORAL | Status: DC

## 2013-09-21 MED ORDER — ZOLPIDEM TARTRATE 5 MG PO TABS
5.0000 mg | ORAL_TABLET | Freq: Every evening | ORAL | Status: DC | PRN
  Administered 2013-09-21 – 2013-09-22 (×2): 5 mg via ORAL
  Filled 2013-09-21 (×3): qty 1

## 2013-09-21 MED ORDER — LACTATED RINGERS IV SOLN
INTRAVENOUS | Status: DC
  Administered 2013-09-21: 05:00:00 via INTRAVENOUS

## 2013-09-21 MED ORDER — LACTATED RINGERS IV BOLUS (SEPSIS)
500.0000 mL | Freq: Once | INTRAVENOUS | Status: AC
  Administered 2013-09-21: 500 mL via INTRAVENOUS

## 2013-09-21 MED ORDER — PROMETHAZINE HCL 25 MG/ML IJ SOLN
12.5000 mg | INTRAMUSCULAR | Status: DC
  Administered 2013-09-21 – 2013-09-22 (×6): 12.5 mg via INTRAVENOUS
  Filled 2013-09-21 (×6): qty 1

## 2013-09-21 MED ORDER — METHYLPREDNISOLONE SODIUM SUCC 125 MG IJ SOLR
48.0000 mg | Freq: Once | INTRAMUSCULAR | Status: AC
  Administered 2013-09-21: 48 mg via INTRAVENOUS
  Filled 2013-09-21: qty 0.77

## 2013-09-21 MED ORDER — METHYLPREDNISOLONE 4 MG PO TABS
4.0000 mg | ORAL_TABLET | Freq: Every day | ORAL | Status: DC
Start: 2013-09-27 — End: 2013-09-23

## 2013-09-21 MED ORDER — PROMETHAZINE HCL 25 MG/ML IJ SOLN
12.5000 mg | Freq: Once | INTRAMUSCULAR | Status: AC
  Administered 2013-09-21: 12.5 mg via INTRAVENOUS
  Filled 2013-09-21: qty 1

## 2013-09-21 MED ORDER — METHYLPREDNISOLONE 4 MG PO TABS
4.0000 mg | ORAL_TABLET | Freq: Every day | ORAL | Status: DC
Start: 2013-09-28 — End: 2013-09-23

## 2013-09-21 MED ORDER — PROMETHAZINE HCL 25 MG/ML IJ SOLN
12.5000 mg | INTRAMUSCULAR | Status: AC
  Administered 2013-09-21: 12.5 mg via INTRAVENOUS
  Filled 2013-09-21: qty 1

## 2013-09-21 MED ORDER — METHYLPREDNISOLONE 4 MG PO TABS: 8.0000 mg | ORAL_TABLET | Freq: Every day | ORAL | Status: DC

## 2013-09-21 MED ORDER — DEXTROSE 5 % AND 0.45 % NACL IV BOLUS
500.0000 mL | Freq: Once | INTRAVENOUS | Status: AC
  Administered 2013-09-21: 500 mL via INTRAVENOUS

## 2013-09-21 MED ORDER — HYDROMORPHONE HCL PF 1 MG/ML IJ SOLN
1.0000 mg | INTRAMUSCULAR | Status: DC | PRN
  Administered 2013-09-21 – 2013-09-22 (×2): 1 mg via INTRAVENOUS
  Filled 2013-09-21 (×2): qty 1

## 2013-09-21 MED ORDER — PANTOPRAZOLE SODIUM 40 MG IV SOLR
40.0000 mg | Freq: Every day | INTRAVENOUS | Status: DC
  Administered 2013-09-21 – 2013-09-22 (×2): 40 mg via INTRAVENOUS
  Filled 2013-09-21 (×3): qty 40

## 2013-09-21 MED ORDER — METHYLPREDNISOLONE 16 MG PO TABS
16.0000 mg | ORAL_TABLET | Freq: Every day | ORAL | Status: DC
  Administered 2013-09-22: 16 mg via ORAL
  Filled 2013-09-21 (×2): qty 1

## 2013-09-21 MED ORDER — HYDROMORPHONE HCL PF 1 MG/ML IJ SOLN
1.0000 mg | Freq: Once | INTRAMUSCULAR | Status: AC
  Administered 2013-09-21: 1 mg via INTRAVENOUS
  Filled 2013-09-21: qty 1

## 2013-09-21 MED ORDER — PROMETHAZINE HCL 25 MG/ML IJ SOLN: 12.5000 mg | INTRAMUSCULAR | Status: DC

## 2013-09-21 MED ORDER — PRENATAL MULTIVITAMIN CH
1.0000 | ORAL_TABLET | Freq: Every day | ORAL | Status: DC
  Administered 2013-09-22 – 2013-09-23 (×2): 1 via ORAL
  Filled 2013-09-21 (×2): qty 1

## 2013-09-21 MED ORDER — PANTOPRAZOLE SODIUM 40 MG IV SOLR: 40.0000 mg | INTRAVENOUS | Status: DC

## 2013-09-21 MED ORDER — METHYLPREDNISOLONE 16 MG PO TABS
16.0000 mg | ORAL_TABLET | Freq: Every day | ORAL | Status: DC
  Administered 2013-09-22 – 2013-09-23 (×2): 16 mg via ORAL
  Filled 2013-09-21 (×3): qty 1

## 2013-09-21 MED ORDER — PRENATAL MULTIVITAMIN CH: 1.0000 | ORAL_TABLET | Freq: Every day | ORAL | Status: DC

## 2013-09-21 NOTE — MAU Provider Note (Signed)
History   22yo, G2P0010 at [redacted]w[redacted]d presents with upper epigastric pain, pt associates with being d/t "so much vomitting" which started tonight.  N/V has been a continuous c/o this entire pregnancy.  Last medications - Zofran at 1900 and Phenergan 25 mg supp at 0000.  Pt denies diarrhea.  Denies VB, UCs, recent fever, resp or GI c/o's, UTI s/s.      Patient Active Problem List   Diagnosis Date Noted  . Vomiting 09/01/2013  . H/o Ectopic pregnancy 09/01/2013    Chief Complaint  Patient presents with  . Emesis During Pregnancy  . Abdominal Pain   HPI  OB History   Grav Para Term Preterm Abortions TAB SAB Ect Mult Living   2 0   1   1        Past Medical History  Diagnosis Date  . H/O varicella   . Breast mass in female 01/21/06  . 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 Irregular menstrual cycle 07/28/07  . Vaginal discharge 10/01/07  . Vaginitis   . BV (bacterial vaginosis) 5.10/11  . History of chlamydia   . Candida vaginitis 04/26/10  . Breast nodule 04/26/10    Left  . Fibroadenoma   . Ectopic pregnancy 03/24/2013    Past Surgical History  Procedure Laterality Date  . Breast cyst excision Left 2007  . Left salpingectomy  03/24/2013    Ectopic Pregnancy  . Laparoscopy N/A 03/24/2013    Procedure: LAPAROSCOPY OPERATIVE;  Surgeon: Betsy Coder, MD;  Location: [facility];  Service: Gynecology;  Laterality: N/A;  . Unilateral salpingectomy Left 03/24/2013    Procedure: UNILATERAL SALPINGECTOMY;  Surgeon: Betsy Coder, MD;  Location: [facility];  Service: Gynecology;  Laterality: Left;    Family History  Problem Relation Age of Onset  . Asthma Maternal Grandmother   . Hypertension Maternal Grandmother   . Diabetes Maternal Grandmother   . Hypertension Paternal Grandfather   . Diabetes Paternal Grandfather     History  Substance Use Topics  . Smoking status: Former Research scientist (life sciences)  . Smokeless tobacco: Not on file  . Alcohol Use: No     Allergies:  Allergies  Allergen Reactions  . Penicillins Rash    Prescriptions prior to admission  Medication Sig Dispense Refill  . docusate sodium (COLACE) 100 MG capsule Take 1 capsule (100 mg total) by mouth 2 (two) times daily.  60 capsule  3  . HYDROmorphone (DILAUDID) 2 MG tablet Take 1 tablet (2 mg total) by mouth every 4 (four) hours as needed for severe pain.  10 tablet  0  . ondansetron (ZOFRAN ODT) 8 MG disintegrating tablet Take 1 tablet (8 mg total) by mouth every 8 (eight) hours as needed for nausea or vomiting.  20 tablet  0  . promethazine (PHENERGAN) 12.5 MG tablet Take 1 tablet (12.5 mg total) by mouth every 6 (six) hours as needed for nausea or vomiting.  30 tablet  0  . promethazine (PHENERGAN) 25 MG suppository Place 1 suppository (25 mg total) rectally every 6 (six) hours as needed for nausea or vomiting.  12 each  0    ROS ROS: see HPI above, all other systems are negative  Physical Exam   Blood pressure 108/48, pulse 104, temperature 98.2 F (36.8 C), temperature source Oral, resp. rate 22, height 5\' 4"  (1.626 m), weight 105 lb 9.6 oz (47.9 kg), last menstrual period 07/31/2013, not currently breastfeeding.  Physical Exam  Constitutional: She  is oriented to person, place, and time. She appears well-developed and well-nourished.  Cardiovascular: Normal rate and regular rhythm.   Respiratory: Effort normal.  GI: Soft. She exhibits no distension. There is tenderness in the epigastric area. There is guarding. There is no rigidity.  Neurological: She is alert and oriented to person, place, and time.  Skin: Skin is warm and dry.  Psychiatric: She has a normal mood and affect. Her behavior is normal.   Results for orders placed during the hospital encounter of 09/21/13 (from the past 24 hour(s))  URINALYSIS, DIPSTICK ONLY     Status: Abnormal   Collection Time    09/21/13  3:11 AM      Result Value Ref Range   Specific Gravity, Urine 1.020  1.005 - 1.030    pH 8.5 (*) 5.0 - 8.0   Glucose, UA NEGATIVE  NEGATIVE mg/dL   Hgb urine dipstick MODERATE (*) NEGATIVE   Bilirubin Urine NEGATIVE  NEGATIVE   Ketones, ur >80 (*) NEGATIVE mg/dL   Protein, ur 30 (*) NEGATIVE mg/dL   Urobilinogen, UA 0.2  0.0 - 1.0 mg/dL   Nitrite NEGATIVE  NEGATIVE   Leukocytes, UA NEGATIVE  NEGATIVE  CBC WITH DIFFERENTIAL     Status: Abnormal   Collection Time    09/21/13  4:00 AM      Result Value Ref Range   WBC 9.8  4.0 - 10.5 K/uL   RBC 3.94  3.87 - 5.11 MIL/uL   Hemoglobin 13.1  12.0 - 15.0 g/dL   HCT 36.0  36.0 - 46.0 %   MCV 91.4  78.0 - 100.0 fL   MCH 33.2  26.0 - 34.0 pg   MCHC 36.4 (*) 30.0 - 36.0 g/dL   RDW 11.3 (*) 11.5 - 15.5 %   Platelets 250  150 - 400 K/uL   Neutrophils Relative % 83 (*) 43 - 77 %   Neutro Abs 8.0 (*) 1.7 - 7.7 K/uL   Lymphocytes Relative 13  12 - 46 %   Lymphs Abs 1.3  0.7 - 4.0 K/uL   Monocytes Relative 4  3 - 12 %   Monocytes Absolute 0.4  0.1 - 1.0 K/uL   Eosinophils Relative 0  0 - 5 %   Eosinophils Absolute 0.0  0.0 - 0.7 K/uL   Basophils Relative 0  0 - 1 %   Basophils Absolute 0.0  0.0 - 0.1 K/uL  LIPASE, BLOOD     Status: None   Collection Time    09/21/13  4:00 AM      Result Value Ref Range   Lipase 28  11 - 59 U/L    ED Course  IUP at [redacted]w[redacted]d Epigastric pain Continued n/v  Urine dip  CBC with diff Lipase Phenergan 12.5 IV Dilaudid 1 mg   Pt reports pain is almost completely resolved, nausea has improved Pt will finish her IVF bag D/c home with precautions ROB already scheduled on 5/20    Linda Hedges CNM, MSN 09/21/2013 3:56 AM

## 2013-09-21 NOTE — MAU Note (Signed)
Lower abdominal pain tonight. N/v x 3-4 days. Denies vaginal bleeding or discharge.

## 2013-09-21 NOTE — Discharge Instructions (Signed)
Hyperemesis Gravidarum  Hyperemesis gravidarum is a severe form of nausea and vomiting that happens during pregnancy. Hyperemesis is worse than morning sickness. It may cause you to have nausea or vomiting all day for many days. It may keep you from eating and drinking enough food and liquids. Hyperemesis usually occurs during the first half (the first 20 weeks) of pregnancy. It often goes away once a woman is in her second half of pregnancy. However, sometimes hyperemesis continues through an entire pregnancy.   CAUSES   The cause of this condition is not completely known but is thought to be related to changes in the body's hormones when pregnant. It could be from the high level of the pregnancy hormone or an increase in estrogen in the body.   SIGNS AND SYMPTOMS   · Severe nausea and vomiting.  · Nausea that does not go away.  · Vomiting that does not allow you to keep any food down.  · Weight loss and body fluid loss (dehydration).  · Having no desire to eat or not liking food you have previously enjoyed.  DIAGNOSIS   Your health care provider will do a physical exam and ask you about your symptoms. He or she may also order blood tests and urine tests to make sure something else is not causing the problem.   TREATMENT   You may only need medicine to control the problem. If medicines do not control the nausea and vomiting, you will be treated in the hospital to prevent dehydration, increased acid in the blood (acidosis), weight loss, and changes in the electrolytes in your body that may harm the unborn baby (fetus). You may need IV fluids.   HOME CARE INSTRUCTIONS   · Only take over-the-counter or prescription medicines as directed by your health care provider.  · Try eating a couple of dry crackers or toast in the morning before getting out of bed.  · Avoid foods and smells that upset your stomach.  · Avoid fatty and spicy foods.  · Eat 5 6 small meals a day.  · Do not drink when eating meals. Drink between  meals.  · For snacks, eat high-protein foods, such as cheese.  · Eat or suck on things that have ginger in them. Ginger helps nausea.  · Avoid food preparation. The smell of food can spoil your appetite.  · Avoid iron pills and iron in your multivitamins until after 3 4 months of being pregnant. However, consult with your health care provider before stopping any prescribed iron pills.  SEEK MEDICAL CARE IF:   · Your abdominal pain increases.  · You have a severe headache.  · You have vision problems.  · You are losing weight.  SEEK IMMEDIATE MEDICAL CARE IF:   · You are unable to keep fluids down.  · You vomit blood.  · You have constant nausea and vomiting.  · You have excessive weakness.  · You have extreme thirst.  · You have dizziness or fainting.  · You have a fever or persistent symptoms for more than 2 3 days.  · You have a fever and your symptoms suddenly get worse.  MAKE SURE YOU:   · Understand these instructions.  · Will watch your condition.  · Will get help right away if you are not doing well or get worse.  Document Released: 04/22/2005 Document Revised: 02/10/2013 Document Reviewed: 12/02/2012  ExitCare® Patient Information ©2014 [employer].

## 2013-09-21 NOTE — Progress Notes (Signed)
Pharmacy Consult:   MEDROL (METHYLPREDNISOLONE) TAPER  FOR HYPEREMESIS GRAVIDARUM PATIENTS  The following is a 14 day taper of methylprednisolone for hyperemesis. Doses on day 1  will be given IV.  All doses starting on day 2  will be given PO. (If patient cannot tolerate oral medications, contact the pharmacy to change route to IV.)   Date Day Morning Midday Bedtime  5-19 1   48 mg  5-20 2 16  mg 16 mg 16 mg  5-21 3 16  mg 16 mg 16 mg  5-22 4 16  mg 8 mg 16 mg  5-23 5 16  mg 8 mg 8 mg  5-24 6 8  mg 8 mg 8 mg  5-25 7 8  mg 4 mg 8 mg  5-26 8 8  mg 4 mg 4 mg  5-27 9 8  mg 4 mg   5-28 10 8  mg 4 mg   5-29 11 8  mg    5-30 12 8  mg    5-31 13 4  mg    6-1 14 4  mg     Check fasting blood sugars daily while on the taper. Notify MD if fasting blood sugar>95.  Vernie Ammons 09/21/2013

## 2013-09-21 NOTE — H&P (Signed)
Debbie Hughes is a 23 y.o. female, G2P0010 at 73 3/7 weeks, presenting for recurrence of hyperemesis--seen in MAU earlier today for same c/o.  D/C'd home after IV hydration and antiemetics, tolerating po fluids.  Called this CNM late this afternoon to report recurrence of N/V.  Admitted to [facility] Unit for hydration and initiation of methylprednisolone regimen.  Denies leaking or bleeding, having moderate abdominal pain, feels "sore".  Was scheduled for visit at [facility] tomorrow for [place] for viability--hx ectopic 03/2013, with [place] in this pregnancy at 5 weeks with IUGS seen, but no fetal pole.  Has had NOB w/u on 5/11.  3 previous visits to ER and MAU for hyperemesis, with one overnight admission for IV hydration.  Patient Active Problem List   Diagnosis Date Noted  . Allergy to penicillin 09/22/2013  . Hx of ectopic pregnancy---s/p left salpingectomy 03/2013 09/22/2013  . Hyperemesis 09/21/2013  . H/o Ectopic pregnancy 09/01/2013    OB History   Grav Para Term Preterm Abortions TAB SAB Ect Mult Living   2 0   1   1       Past Medical History  Diagnosis Date  . H/O varicella   . Breast mass in female 01/21/06  . 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 Irregular menstrual cycle 07/28/07  . Vaginal discharge 10/01/07  . Vaginitis   . BV (bacterial vaginosis) 5.10/11  . History of chlamydia   . Candida vaginitis 04/26/10  . Breast nodule 04/26/10    Left  . Fibroadenoma   . Ectopic pregnancy 03/24/2013   Past Surgical History  Procedure Laterality Date  . Breast cyst excision Left 2007  . Left salpingectomy  03/24/2013    Ectopic Pregnancy  . Laparoscopy N/A 03/24/2013    Procedure: LAPAROSCOPY OPERATIVE;  Surgeon: Betsy Coder, MD;  Location: [place] [facility];  Service: Gynecology;  Laterality: N/A;  . Unilateral salpingectomy Left 03/24/2013    Procedure: UNILATERAL SALPINGECTOMY;  Surgeon: Betsy Coder, MD;  Location: [place] [facility];  Service: Gynecology;   Laterality: Left;   Family History: family history includes Asthma in her maternal grandmother; Diabetes in her maternal grandmother and paternal grandfather; Hypertension in her maternal grandmother and paternal grandfather.  Social History:  reports that she has quit smoking. She does not have any smokeless tobacco history on file. She reports that she does not drink alcohol or use illicit drugs.   ROS:  N/V, abdominal pain.  Allergies  Allergen Reactions  . Penicillins Rash       Blood pressure 107/61, pulse 97, temperature 98.2 F (36.8 C), temperature source Oral, resp. rate 16, height 5\' 4"  (1.626 m), weight 105 lb 9.6 oz (47.9 kg), last menstrual period 07/31/2013, not currently breastfeeding.  Chest clear Heart RRR without murmur Abd NT, FH 7 weeks, no rebound or guarding. Pelvic: Deferred Ext: WNL  FHR: 134 per [place] after admission   Prenatal labs: ABO, Rh: --/--/A POS, A POS (11/19 1850) Antibody: NEG (11/19 1850) Rubella:   Immune RPR:   NR HBsAg:   Neg HIV:   NR GBS:  NA Sickle cell/Hgb electrophoresis:  AA Pap:  WNL 09/13/13 GC:  Negative 09/13/13 Chlamydia:  Negative 09/13/13    Assessment/Plan: IUP at 7 3/7 weeks Hyperemesis  Plan: Admit to [facility] Unit per consult with Dr. Mancel Bale IV hydration Antiemetics Initiation of methylprednisolone regimen CBC, CMP, amylase, TSH, QHCG (in case US shows non-viable pregnancy) [place] for viability.  Bubba Camp, [place] 09/22/2013, 9:49  AM     

## 2013-09-21 NOTE — MAU Note (Signed)
Feeling better after 2 bags of fluid--no nausea, stomach muscles "sore", pain medication helped.   Has not attempted any po intake since arrival. Will try ice chips, then ginger ale, for trial. If tolerates, will plan d/c home. Has appt tomorrow at [facility] for [place].  Donnel Saxon, CNM 09/21/13 8a  Addendum: Tolerated po fluids without N/V--no acute pain. D/C home. Continue home anti-emetics (has Phenergan tabs and suppositories), Zofran Will get samples of Diclegis tomorrow. Sips and chips today, advance to very bland diet as tolerated. Keep scheduled appt tomorrow at [facility] for [place].  Donnel Saxon, CNM 5/1`9/156 8:45a

## 2013-09-21 NOTE — MAU Note (Signed)
Urine provided not enough for urine microscopic however enough for urine dipstick. Order changed to urine dipstick.

## 2013-09-22 ENCOUNTER — Encounter (HOSPITAL_COMMUNITY): Payer: Self-pay | Admitting: *Deleted

## 2013-09-22 DIAGNOSIS — Z880 Allergy status to penicillin: Secondary | ICD-10-CM

## 2013-09-22 DIAGNOSIS — Z8759 Personal history of other complications of pregnancy, childbirth and the puerperium: Secondary | ICD-10-CM

## 2013-09-22 LAB — GLUCOSE, CAPILLARY
GLUCOSE-CAPILLARY: 150 mg/dL — AB (ref 70–99)
Glucose-Capillary: 101 mg/dL — ABNORMAL HIGH (ref 70–99)

## 2013-09-22 LAB — PREALBUMIN: Prealbumin: 22.5 mg/dL (ref 17.0–34.0)

## 2013-09-22 MED ORDER — PROMETHAZINE HCL 25 MG/ML IJ SOLN
12.5000 mg | Freq: Four times a day (QID) | INTRAMUSCULAR | Status: DC
  Administered 2013-09-22 – 2013-09-23 (×2): 12.5 mg via INTRAVENOUS
  Filled 2013-09-22 (×2): qty 1

## 2013-09-22 NOTE — Progress Notes (Signed)
Ur chart review completed.  

## 2013-09-22 NOTE — Progress Notes (Addendum)
Hospital day # 1--7 4/7 weeks, hyperemesis Report feeling better--tolerating clear liquid diet this am.  Vomited twice last night prior to MN, mild nausea today.  Abdominal pain minimal today.   Patient Active Problem List   Diagnosis Date Noted  . Hyperemesis 09/21/2013  . Vomiting 09/01/2013  . H/o Ectopic pregnancy 09/01/2013     Objective: Vital signs in last 24 hours: Temp:  [98.3 F (36.8 C)] 98.3 F (36.8 C) (05/20 0603) Pulse Rate:  [83-97] 83 (05/20 0603) Resp:  [16-18] 16 (05/20 0603) BP: (97-114)/(47-61) 97/47 mmHg (05/20 0603) SpO2:  [100 %] 100 % (05/20 0603) Weight:  [105 lb 12 oz (47.968 kg)-107 lb 12 oz (48.875 kg)] 107 lb 12 oz (48.875 kg) (05/20 0603)  Weight Review: 09/01/13--116 09/02/13--112 09/21/13--105.9 09/22/13--107.12  I/O: 1750 cc output last shift, urine clear.  Physical Exam:  General: alert Chest clear Heart RRR without murmur Abdomen: Soft, NT, no rebound or guarding. DVT Evaluation: No evidence of DVT seen on physical exam.  SCDs on.   Recent Labs  09/21/13 0400 09/21/13 1834  HGB 13.1 12.1  HCT 36.0 33.7*  WBC 9.8 9.2     Labs:   Results for orders placed during the hospital encounter of 09/21/13 (from the past 24 hour(s))  TSH     Status: None   Collection Time    09/21/13  6:36 PM      Result Value Ref Range   TSH 1.180  0.350 - 4.500 uIU/mL  AMYLASE     Status: None   Collection Time    09/21/13  6:47 PM      Result Value Ref Range   Amylase 84  0 - 105 U/L  PREALBUMIN     Status: None   Collection Time    09/21/13  6:47 PM      Result Value Ref Range   Prealbumin 22.5  17.0 - 34.0 mg/dL  URINALYSIS, ROUTINE W REFLEX MICROSCOPIC     Status: Abnormal   Collection Time    09/21/13  7:30 PM      Result Value Ref Range   Color, Urine YELLOW  YELLOW   APPearance CLEAR  CLEAR   Specific Gravity, Urine <1.005 (*) 1.005 - 1.030   pH 7.0  5.0 - 8.0   Glucose, UA NEGATIVE  NEGATIVE mg/dL   Hgb urine dipstick NEGATIVE   NEGATIVE   Bilirubin Urine NEGATIVE  NEGATIVE   Ketones, ur 40 (*) NEGATIVE mg/dL   Protein, ur NEGATIVE  NEGATIVE mg/dL   Urobilinogen, UA 0.2  0.0 - 1.0 mg/dL   Nitrite NEGATIVE  NEGATIVE   Leukocytes, UA NEGATIVE  NEGATIVE  GLUCOSE, CAPILLARY     Status: Abnormal   Collection Time    09/22/13  5:54 AM      Result Value Ref Range   Glucose-Capillary 150 (*) 70 - 99 mg/dL   [place] on admission:  SIUP, 7 1/7 weeks, EDC 05/09/14, c/w dating.   Meds: Methylprednisolone taper--1st dose IV 2112, 2nd dose due at 1400.            Phenergan 12.5 mg IV q 4 hours  Received single dose of Dilaudid for abdominal pain at 2221.  Assessment/Plan: IUP at 7 4/7 weeks Hyperemesis  Plan: Continue IV hydration Continue Medrol taper Clear liquid diet today--will advance to bland when patient feels she can tolerate. Recheck CBG at noon. Likely for discharge tomorrow, if tolerating po intake.  Donnel Saxon CNM, [place] 09/22/2013, 8:46 AM

## 2013-09-22 NOTE — Progress Notes (Signed)
INITIAL NUTRITION ASSESSMENT  DOCUMENTATION CODES Per approved criteria  -Not Applicable   INTERVENTION: C/L diet, advance to bland diet Resource boost breeze or snacks TID  NUTRITION DIAGNOSIS: Inadequate po intake  related to hyperemesis  as evidenced by n/v, weight loss.   Goal: tol of po diet/ weight gain  Monitor:  Diet tol  Reason for Assessment: Dx of hyperemesis  23 y.o. female  Admitting Dx: <principal problem not specified>  ASSESSMENT: Admission 4 weeks ago for n/v. Was able to tol diet for 2 weeks after that admission, the N/V returned. Last tol of entire meal was last Friday. Four days of minimal po intake. Kept liquids down after breakfast today.   Height: Ht Readings from Last 1 Encounters:  09/21/13 5\' 4"  (1.626 m)    Weight: Wt Readings from Last 1 Encounters:  09/22/13 107 lb 12 oz (48.875 kg)    Ideal Body Weight: 120  % Ideal Body Weight: 89%  Wt Readings from Last 10 Encounters:  09/22/13 107 lb 12 oz (48.875 kg)  09/21/13 105 lb 9.6 oz (47.9 kg)  09/02/13 112 lb 0.4 oz (50.814 kg)  03/24/13 114 lb 6.4 oz (51.891 kg)  03/24/13 114 lb 6.4 oz (51.891 kg)  12/19/11 122 lb (55.339 kg)  11/19/11 118 lb (53.524 kg)    Usual Body Weight: 116 lbs ( per pt)  % Usual Body Weight: 92%  BMI:  Body mass index is 18.49 kg/(m^2).  Estimated Nutritional Needs: Kcal: 15-1700 Protein: 55-65 g Fluid: 1.7L    Diet Order: Clear Liquid  EDUCATION NEEDS: -Education needs addressed. Pt provide w/copy of AND " diet for Morning Sickness". Reviewed with pt, stated understanding.   Intake/Output Summary (Last 24 hours) at 09/22/13 1413 Last data filed at 09/22/13 1126  Gross per 24 hour  Intake    120 ml  Output   4300 ml  Net  -4180 ml    Labs:   Recent Labs Lab 09/21/13 1834  NA 136*  K 3.4*  CL 101  CO2 20  BUN 6  CREATININE 0.60  CALCIUM 9.3  GLUCOSE 94    CBG (last 3)   Recent Labs  09/22/13 0554 09/22/13 1154  GLUCAP  150* 101*    Scheduled Meds: . methylPREDNISolone  16 mg Oral Q breakfast   Followed by  . [START ON 09/26/2013] methylPREDNISolone  8 mg Oral Q breakfast   Followed by  . [START ON 10/03/2013] methylPREDNISolone  4 mg Oral Q breakfast  . methylPREDNISolone  16 mg Oral Q1400   Followed by  . [START ON 09/24/2013] methylPREDNISolone  8 mg Oral Q1400   Followed by  . [START ON 09/27/2013] methylPREDNISolone  4 mg Oral Q1400  . methylPREDNISolone  16 mg Oral QHS   Followed by  . [START ON 09/25/2013] methylPREDNISolone  8 mg Oral QHS   Followed by  . [START ON 09/28/2013] methylPREDNISolone  4 mg Oral QHS  . pantoprazole (PROTONIX) IV  40 mg Intravenous QHS  . prenatal multivitamin  1 tablet Oral Q1200  . promethazine  12.5 mg Intravenous Q4H    Continuous Infusions: . dextrose 5 % and 0.45% NaCl 150 mL/hr at 09/22/13 1126    Past Medical History  Diagnosis Date  . H/O varicella   . Breast mass in female 01/21/06  . 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 Irregular menstrual cycle 07/28/07  . Vaginal discharge 10/01/07  . Vaginitis   . BV (bacterial vaginosis)  5.10/11  . History of chlamydia   . Candida vaginitis 04/26/10  . Breast nodule 04/26/10    Left  . Fibroadenoma   . Ectopic pregnancy 03/24/2013    Past Surgical History  Procedure Laterality Date  . Breast cyst excision Left 2007  . Left salpingectomy  03/24/2013    Ectopic Pregnancy  . Laparoscopy N/A 03/24/2013    Procedure: LAPAROSCOPY OPERATIVE;  Surgeon: Betsy Coder, MD;  Location: [facility];  Service: Gynecology;  Laterality: N/A;  . Unilateral salpingectomy Left 03/24/2013    Procedure: UNILATERAL SALPINGECTOMY;  Surgeon: Betsy Coder, MD;  Location: [place];  Service: Gynecology;  Laterality: Left;    Weyman Rodney M.Fredderick Severance LDN Neonatal Nutrition Support Specialist Pager [phone]

## 2013-09-23 LAB — GLUCOSE, CAPILLARY: GLUCOSE-CAPILLARY: 119 mg/dL — AB (ref 70–99)

## 2013-09-23 MED ORDER — METHYLPREDNISOLONE 4 MG PO TABS: 4.0000 mg | ORAL_TABLET | Freq: Every day | ORAL | Status: DC

## 2013-09-23 MED ORDER — PROMETHAZINE HCL 12.5 MG RE SUPP: 12.5000 mg | Freq: Four times a day (QID) | RECTAL | Status: DC | PRN

## 2013-09-23 NOTE — Discharge Summary (Signed)
  Physician Discharge Summary  Patient ID: JOSELYNN AMOROSO MRN: [id number] DOB/AGE: 1991-01-08 23 y.o.  Admit date: 09/21/2013 Discharge date: 09/23/2013  Admission Diagnoses: Hyperemesis  Discharge Diagnoses: Hyperemesis (resolved) Active Problems:   Hyperemesis   Allergy to penicillin   Hx of ectopic pregnancy---s/p left salpingectomy 03/2013   Discharged Condition: stable  Hospital Course: Pt admitted for hyperemesis. She was given IVFs, phenergan suppositories, Dilaudid and Medrol. She has only vomited once in the past 24 hours. She will be sent home w/ strict precautions  Consults: None  Significant Diagnostic Studies: None  Treatments: IV hydration, Medrol analgesia (Dilaudid) and phenergan suppositories  Discharge Exam: 09/23/13 Blood pressure 106/56, pulse 83, temperature 99 F (37.2 C), temperature source Oral, resp. rate 16, height 5\' 4"  (1.626 m), weight 109 lb 4 oz (49.555 kg), last menstrual period 07/31/2013, SpO2 100.00%. Physical Exam: General appearance - alert, well appearing, and in no distress Lungs: CTAB CV: HRRR Abdomen: soft, NT, nondistended, no masses or organomegaly, gravid Pelvic: deferred Ext: No edema  Disposition: 01-Home or Self Care  Discharge Instructions   Discharge patient    Complete by:  As directed             Medication List    STOP taking these medications       HYDROmorphone 2 MG tablet  Commonly known as:  DILAUDID     ondansetron 8 MG disintegrating tablet  Commonly known as:  ZOFRAN ODT      TAKE these medications       acetaminophen 500 MG tablet  Commonly known as:  TYLENOL  Take 1,000 mg by mouth every 6 (six) hours as needed for moderate pain.     docusate sodium 100 MG capsule  Commonly known as:  COLACE  Take 1 capsule (100 mg total) by mouth 2 (two) times daily.     methylPREDNISolone 4 MG tablet  Commonly known as:  MEDROL  Take 1 tablet (4 mg total) by mouth daily.     promethazine 12.5 MG  suppository  Commonly known as:  PHENERGAN  Place 1 suppository (12.5 mg total) rectally every 6 (six) hours as needed for nausea or vomiting.           Follow-up Information   Follow up with [facility]. Schedule an appointment as soon as possible for a visit in 1 week. (Call with questions or concerns)    Specialty:  Obstetrics and Gynecology   Contact information:   [address] [place] [id number] [phone]    SAB precautions given Advised to advance diet as tolerated  Signed: Farrel Gordon 09/23/2013, 12:41 PM

## 2013-09-23 NOTE — Discharge Instructions (Signed)
Hyperemesis Gravidarum  Hyperemesis gravidarum is a severe form of nausea and vomiting that happens during pregnancy. Hyperemesis is worse than morning sickness. It may cause you to have nausea or vomiting all day for many days. It may keep you from eating and drinking enough food and liquids. Hyperemesis usually occurs during the first half (the first 20 weeks) of pregnancy. It often goes away once a woman is in her second half of pregnancy. However, sometimes hyperemesis continues through an entire pregnancy.   CAUSES   The cause of this condition is not completely known but is thought to be related to changes in the body's hormones when pregnant. It could be from the high level of the pregnancy hormone or an increase in estrogen in the body.   SIGNS AND SYMPTOMS   · Severe nausea and vomiting.  · Nausea that does not go away.  · Vomiting that does not allow you to keep any food down.  · Weight loss and body fluid loss (dehydration).  · Having no desire to eat or not liking food you have previously enjoyed.  DIAGNOSIS   Your health care provider will do a physical exam and ask you about your symptoms. He or she may also order blood tests and urine tests to make sure something else is not causing the problem.   TREATMENT   You may only need medicine to control the problem. If medicines do not control the nausea and vomiting, you will be treated in the hospital to prevent dehydration, increased acid in the blood (acidosis), weight loss, and changes in the electrolytes in your body that may harm the unborn baby (fetus). You may need IV fluids.   HOME CARE INSTRUCTIONS   · Only take over-the-counter or prescription medicines as directed by your health care provider.  · Try eating a couple of dry crackers or toast in the morning before getting out of bed.  · Avoid foods and smells that upset your stomach.  · Avoid fatty and spicy foods.  · Eat 5 6 small meals a day.  · Do not drink when eating meals. Drink between  meals.  · For snacks, eat high-protein foods, such as cheese.  · Eat or suck on things that have ginger in them. Ginger helps nausea.  · Avoid food preparation. The smell of food can spoil your appetite.  · Avoid iron pills and iron in your multivitamins until after 3 4 months of being pregnant. However, consult with your health care provider before stopping any prescribed iron pills.  SEEK MEDICAL CARE IF:   · Your abdominal pain increases.  · You have a severe headache.  · You have vision problems.  · You are losing weight.  SEEK IMMEDIATE MEDICAL CARE IF:   · You are unable to keep fluids down.  · You vomit blood.  · You have constant nausea and vomiting.  · You have excessive weakness.  · You have extreme thirst.  · You have dizziness or fainting.  · You have a fever or persistent symptoms for more than 2 3 days.  · You have a fever and your symptoms suddenly get worse.  MAKE SURE YOU:   · Understand these instructions.  · Will watch your condition.  · Will get help right away if you are not doing well or get worse.  Document Released: 04/22/2005 Document Revised: 02/10/2013 Document Reviewed: 12/02/2012  ExitCare® Patient Information ©2014 [employer].

## 2013-09-23 NOTE — Progress Notes (Signed)
Pt ambulated out teaching complete  

## 2013-10-29 LAB — OB RESULTS CONSOLE HEPATITIS B SURFACE ANTIGEN: HEP B S AG: NEGATIVE

## 2013-10-29 LAB — OB RESULTS CONSOLE ANTIBODY SCREEN: Antibody Screen: NEGATIVE

## 2013-10-29 LAB — OB RESULTS CONSOLE RPR: RPR: NONREACTIVE

## 2013-10-29 LAB — OB RESULTS CONSOLE ABO/RH: RH Type: POSITIVE

## 2013-10-29 LAB — OB RESULTS CONSOLE HIV ANTIBODY (ROUTINE TESTING): HIV: NONREACTIVE

## 2013-10-29 LAB — OB RESULTS CONSOLE RUBELLA ANTIBODY, IGM: RUBELLA: IMMUNE

## 2014-03-07 ENCOUNTER — Encounter (HOSPITAL_COMMUNITY): Payer: Self-pay | Admitting: *Deleted

## 2014-04-13 LAB — OB RESULTS CONSOLE GC/CHLAMYDIA
Chlamydia: NEGATIVE
Gonorrhea: NEGATIVE

## 2014-04-13 LAB — OB RESULTS CONSOLE GBS: GBS: POSITIVE

## 2014-04-28 ENCOUNTER — Inpatient Hospital Stay (HOSPITAL_COMMUNITY)
Admission: AD | Admit: 2014-04-28 | Discharge: 2014-05-01 | DRG: 775 | Disposition: A | Discharge status: Home / Self Care | Payer: BC Managed Care – PPO | Source: Ambulatory Visit | Attending: Obstetrics and Gynecology | Admitting: Obstetrics and Gynecology

## 2014-04-28 ENCOUNTER — Encounter (HOSPITAL_COMMUNITY): Payer: Self-pay | Admitting: *Deleted

## 2014-04-28 DIAGNOSIS — Z3A38 38 weeks gestation of pregnancy: Secondary | ICD-10-CM | POA: Diagnosis present

## 2014-04-28 DIAGNOSIS — IMO0001 Reserved for inherently not codable concepts without codable children: Secondary | ICD-10-CM

## 2014-04-28 DIAGNOSIS — O4292 Full-term premature rupture of membranes, unspecified as to length of time between rupture and onset of labor: Principal | ICD-10-CM | POA: Diagnosis present

## 2014-04-28 DIAGNOSIS — A491 Streptococcal infection, unspecified site: Secondary | ICD-10-CM | POA: Diagnosis present

## 2014-04-28 DIAGNOSIS — Z833 Family history of diabetes mellitus: Secondary | ICD-10-CM

## 2014-04-28 DIAGNOSIS — Z87891 Personal history of nicotine dependence: Secondary | ICD-10-CM

## 2014-04-28 DIAGNOSIS — O99824 Streptococcus B carrier state complicating childbirth: Secondary | ICD-10-CM | POA: Diagnosis present

## 2014-04-28 DIAGNOSIS — Z8249 Family history of ischemic heart disease and other diseases of the circulatory system: Secondary | ICD-10-CM

## 2014-04-28 DIAGNOSIS — Z825 Family history of asthma and other chronic lower respiratory diseases: Secondary | ICD-10-CM

## 2014-04-28 DIAGNOSIS — Z880 Allergy status to penicillin: Secondary | ICD-10-CM

## 2014-04-28 MED ORDER — LACTATED RINGERS IV SOLN
INTRAVENOUS | Status: DC
  Administered 2014-04-28: via INTRAVENOUS

## 2014-04-28 NOTE — MAU Note (Signed)
Pt reports ROM at 2050

## 2014-04-28 NOTE — H&P (Signed)
Debbie Hughes is a 23 y.o. female, G3P0020 at 38.6 weeks, presenting for PROM at 22.  Patient states that she got up to go to the restroom and had a gush of fluid.  Patient reports that she was not contracting prior to the incident and has not had any VB.  Patient reports good fetal movement and desires no epidural.  Patient is GBS positive with PCN allergy.    Patient Active Problem List   Diagnosis Date Noted  . Allergy to penicillin 09/22/2013  . Hx of ectopic pregnancy---s/p left salpingectomy 03/2013 09/22/2013  . Hyperemesis 09/21/2013    History of present pregnancy: Patient entered care at 6.2 weeks.   EDC of 05/07/2013 was established by Definite LMP of 07/31/2013.   Anatomy scan:  19.5 weeks, with normal, but limited, findings and an posterior placenta.   Additional [place] evaluations:   -Anatomy: No anomalies, spine and aortic arch not seen. nl fluid. female, c/w dates, post placenta  -F/U Anatomy: 21.5wks [place] Pregnancy in breech presentation. Fetal spine and cardiac anatomy appears normal. Anatomy is now completed. Measurements are consistently 8 days less than LMP dating. HC is small. Posterior fundal placenta. Cervix long and closed. SDP 5.7cm WNL Significant prenatal events:  1st Trimester: C/O Heartburn, back pain, and HA. 2nd Trimester: Influenza and Tdap given.  3rd Trimester: Pain in between legs and urinary frequency.   Swelling and soreness in feet.  Last evaluation:04/28/2014 at 38.5wks by L.Eulas Post, [place].  FHR 130 VE: 0cm / 80% / -1/Soft/Posterior, B/P 130/82, Wt 177 lbs  OB History    Gravida Para Term Preterm AB TAB SAB Ectopic Multiple Living   2 0   1   1       Past Medical History  Diagnosis Date  . H/O varicella   . Breast mass in female 01/21/06  . 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 Irregular menstrual cycle 07/28/07  . Vaginal discharge 10/01/07  . Vaginitis   . BV (bacterial vaginosis) 5.10/11  . History of  chlamydia   . Candida vaginitis 04/26/10  . Breast nodule 04/26/10    Left  . Fibroadenoma   . Ectopic pregnancy 03/24/2013   Past Surgical History  Procedure Laterality Date  . Breast cyst excision Left 2007  . Left salpingectomy  03/24/2013    Ectopic Pregnancy  . Laparoscopy N/A 03/24/2013    Procedure: LAPAROSCOPY OPERATIVE;  Surgeon: Betsy Coder, MD;  Location: [facility];  Service: Gynecology;  Laterality: N/A;  . Unilateral salpingectomy Left 03/24/2013    Procedure: UNILATERAL SALPINGECTOMY;  Surgeon: Betsy Coder, MD;  Location: [facility];  Service: Gynecology;  Laterality: Left;   Family History: family history includes Asthma in her maternal grandmother; Diabetes in her maternal grandmother and paternal grandfather; Hypertension in her maternal grandmother and paternal grandfather. Social History:  reports that she has quit smoking. She does not have any smokeless tobacco history on file. She reports that she does not drink alcohol or use illicit drugs. Patient is married to husband, Oddie.   Prenatal Transfer Tool  Maternal Diabetes: No Genetic Screening: Normal Maternal Ultrasounds/Referrals: Normal Fetal Ultrasounds or other Referrals:  Fetal echo Maternal Substance Abuse:  No Significant Maternal Medications:  Meds include: Zantac Other:  PNV Significant Maternal Lab Results: Lab values include: Group B Strep positive    ROS:  See HPI Above  Allergies  Allergen Reactions  . Penicillins Rash     Dilation: 1 Effacement (%):  [place]: -2 Exam by:: J Dhwani Venkatesh CNM Blood pressure 133/86, pulse 98, temperature 98.7 F (37.1 C), temperature source Oral, height 5\' 4"  (1.626 m), weight 174 lb (78.926 kg), last menstrual period 07/31/2013, SpO2 100 %.   Appears Well, No Distress Lungs CTA Heart RRR  Abd gravid, NT Pelvic: Cervix posterior note visualized.  No active leaking noted. Exam as above, Fern collected and positive.  Feels Adequate EFW: 5 1/2-6lbs by  [place] Ext: Pedal and calf edema, +2  FHR: 135 bpm, Mod Var, -Decels, +Accels UCs:  Q2-26mins, palpates moderate  Prenatal labs: ABO, Rh:   A Positive Antibody:  Negative Rubella:   Immune RPR:   NR HBsAg:   Negative HIV:   Negative GBS:  Positive Sickle cell/Hgb electrophoresis:  Normal Pap:  Normal GC:  Negative Chlamydia:  Negative Genetic screenings:  Negative Glucola:  Negative Other:  None    Assessment IUP at 38.5wks Cat I FT PROM  GBS Positive PCN Allergy  Plan: Admit to [facility] per consult with Dr. AVS Routine Labor and Delivery Orders per [facility] Protocol Start Vancomycin for GBS Prophylaxis Intermittent monitoring after reactive NST Will reassess prn  Trenyce Loera LYNN  CNM, MSN 04/28/2014, 11:35 PM

## 2014-04-29 ENCOUNTER — Encounter (HOSPITAL_COMMUNITY): Payer: Self-pay

## 2014-04-29 DIAGNOSIS — Z880 Allergy status to penicillin: Secondary | ICD-10-CM | POA: Diagnosis not present

## 2014-04-29 DIAGNOSIS — Z833 Family history of diabetes mellitus: Secondary | ICD-10-CM | POA: Diagnosis not present

## 2014-04-29 DIAGNOSIS — IMO0001 Reserved for inherently not codable concepts without codable children: Secondary | ICD-10-CM

## 2014-04-29 DIAGNOSIS — Z825 Family history of asthma and other chronic lower respiratory diseases: Secondary | ICD-10-CM | POA: Diagnosis not present

## 2014-04-29 DIAGNOSIS — Z87891 Personal history of nicotine dependence: Secondary | ICD-10-CM | POA: Diagnosis not present

## 2014-04-29 DIAGNOSIS — O4292 Full-term premature rupture of membranes, unspecified as to length of time between rupture and onset of labor: Secondary | ICD-10-CM | POA: Diagnosis present

## 2014-04-29 DIAGNOSIS — A491 Streptococcal infection, unspecified site: Secondary | ICD-10-CM | POA: Diagnosis present

## 2014-04-29 DIAGNOSIS — Z3A38 38 weeks gestation of pregnancy: Secondary | ICD-10-CM | POA: Diagnosis present

## 2014-04-29 DIAGNOSIS — O99824 Streptococcus B carrier state complicating childbirth: Secondary | ICD-10-CM | POA: Diagnosis present

## 2014-04-29 DIAGNOSIS — Z8249 Family history of ischemic heart disease and other diseases of the circulatory system: Secondary | ICD-10-CM | POA: Diagnosis not present

## 2014-04-29 LAB — CBC
HCT: 36.1 % (ref 36.0–46.0)
HEMATOCRIT: 35.4 % — AB (ref 36.0–46.0)
HEMOGLOBIN: 12.3 g/dL (ref 12.0–15.0)
Hemoglobin: 12.6 g/dL (ref 12.0–15.0)
MCH: 32.5 pg (ref 26.0–34.0)
MCH: 32.6 pg (ref 26.0–34.0)
MCHC: 34.7 g/dL (ref 30.0–36.0)
MCHC: 34.9 g/dL (ref 30.0–36.0)
MCV: 93.4 fL (ref 78.0–100.0)
MCV: 93.5 fL (ref 78.0–100.0)
Platelets: 160 10*3/uL (ref 150–400)
Platelets: 182 K/uL (ref 150–400)
RBC: 3.79 MIL/uL — ABNORMAL LOW (ref 3.87–5.11)
RBC: 3.86 MIL/uL — ABNORMAL LOW (ref 3.87–5.11)
RDW: 12.3 % (ref 11.5–15.5)
RDW: 12.4 % (ref 11.5–15.5)
WBC: 13.4 10*3/uL — ABNORMAL HIGH (ref 4.0–10.5)
WBC: 7.6 K/uL (ref 4.0–10.5)

## 2014-04-29 LAB — SYPHILIS: RPR W/REFLEX TO RPR TITER AND TREPONEMAL ANTIBODIES, TRADITIONAL SCREENING AND DIAGNOSIS ALGORITHM

## 2014-04-29 MED ORDER — DIPHENHYDRAMINE HCL 25 MG PO CAPS: 25.0000 mg | ORAL_CAPSULE | Freq: Four times a day (QID) | ORAL | Status: DC | PRN

## 2014-04-29 MED ORDER — SENNOSIDES-DOCUSATE SODIUM 8.6-50 MG PO TABS
2.0000 | ORAL_TABLET | ORAL | Status: DC
  Administered 2014-04-30 (×2): 2 via ORAL
  Filled 2014-04-29 (×2): qty 2

## 2014-04-29 MED ORDER — BENZOCAINE-MENTHOL 20-0.5 % EX AERO: 1.0000 "application " | INHALATION_SPRAY | CUTANEOUS | Status: DC | PRN

## 2014-04-29 MED ORDER — CITRIC ACID-SODIUM CITRATE 334-500 MG/5ML PO SOLN: 30.0000 mL | ORAL | Status: DC | PRN

## 2014-04-29 MED ORDER — OXYCODONE-ACETAMINOPHEN 5-325 MG PO TABS: 2.0000 | ORAL_TABLET | ORAL | Status: DC | PRN

## 2014-04-29 MED ORDER — ONDANSETRON HCL 4 MG/2ML IJ SOLN: 4.0000 mg | Freq: Four times a day (QID) | INTRAMUSCULAR | Status: DC | PRN

## 2014-04-29 MED ORDER — IBUPROFEN 600 MG PO TABS
600.0000 mg | ORAL_TABLET | Freq: Four times a day (QID) | ORAL | Status: DC
  Administered 2014-04-29 – 2014-05-01 (×10): 600 mg via ORAL
  Filled 2014-04-29 (×10): qty 1

## 2014-04-29 MED ORDER — LACTATED RINGERS IV SOLN: 500.0000 mL | INTRAVENOUS | Status: DC | PRN

## 2014-04-29 MED ORDER — OXYCODONE-ACETAMINOPHEN 5-325 MG PO TABS: 1.0000 | ORAL_TABLET | ORAL | Status: DC | PRN

## 2014-04-29 MED ORDER — TETANUS-DIPHTH-ACELL PERTUSSIS 5-2.5-18.5 LF-MCG/0.5 IM SUSP: 0.5000 mL | Freq: Once | INTRAMUSCULAR | Status: DC

## 2014-04-29 MED ORDER — DIBUCAINE 1 % RE OINT: 1.0000 "application " | TOPICAL_OINTMENT | RECTAL | Status: DC | PRN

## 2014-04-29 MED ORDER — ONDANSETRON HCL 4 MG/2ML IJ SOLN: 4.0000 mg | INTRAMUSCULAR | Status: DC | PRN

## 2014-04-29 MED ORDER — ZOLPIDEM TARTRATE 5 MG PO TABS: 5.0000 mg | ORAL_TABLET | Freq: Every evening | ORAL | Status: DC | PRN

## 2014-04-29 MED ORDER — ONDANSETRON HCL 4 MG PO TABS: 4.0000 mg | ORAL_TABLET | ORAL | Status: DC | PRN

## 2014-04-29 MED ORDER — VANCOMYCIN HCL IN DEXTROSE 1-5 GM/200ML-% IV SOLN
1000.0000 mg | Freq: Two times a day (BID) | INTRAVENOUS | Status: DC
  Filled 2014-04-29 (×2): qty 200

## 2014-04-29 MED ORDER — PRENATAL MULTIVITAMIN CH
1.0000 | ORAL_TABLET | Freq: Every day | ORAL | Status: DC
  Administered 2014-04-29 – 2014-05-01 (×3): 1 via ORAL
  Filled 2014-04-29 (×3): qty 1

## 2014-04-29 MED ORDER — ACETAMINOPHEN 325 MG PO TABS: 650.0000 mg | ORAL_TABLET | ORAL | Status: DC | PRN

## 2014-04-29 MED ORDER — WITCH HAZEL-GLYCERIN EX PADS: 1.0000 "application " | MEDICATED_PAD | CUTANEOUS | Status: DC | PRN

## 2014-04-29 MED ORDER — LIDOCAINE HCL (PF) 1 % IJ SOLN
30.0000 mL | INTRAMUSCULAR | Status: DC | PRN
  Filled 2014-04-29: qty 30

## 2014-04-29 MED ORDER — LANOLIN HYDROUS EX OINT: TOPICAL_OINTMENT | CUTANEOUS | Status: DC | PRN

## 2014-04-29 MED ORDER — OXYTOCIN BOLUS FROM INFUSION: 500.0000 mL | INTRAVENOUS | Status: DC

## 2014-04-29 MED ORDER — OXYTOCIN 40 UNITS IN LACTATED RINGERS INFUSION - SIMPLE MED
62.5000 mL/h | INTRAVENOUS | Status: DC
  Filled 2014-04-29: qty 1000

## 2014-04-29 MED ORDER — NALBUPHINE HCL 10 MG/ML IJ SOLN: 10.0000 mg | INTRAMUSCULAR | Status: DC | PRN

## 2014-04-29 MED ORDER — SIMETHICONE 80 MG PO CHEW: 80.0000 mg | CHEWABLE_TABLET | ORAL | Status: DC | PRN

## 2014-04-29 NOTE — Lactation Note (Signed)
This note was copied from the chart of Debbie Minette Dulude. Lactation Consultation Note Reminded mom to feed baby every 3 hours.  I showed her how to hand express and she was able to express about 5 ml.  I told her that the baby could be spoon fed BM if she was not latching in 3 hours or that a small amount could be use to stimulate her to cue.  Baby was cuing while I was in the room and with minimal assist mom was able to latch her.  Dad stated that he does not think that baby will be able to be BF because they will be busy or always have visitors at their house.  I educated him that once BF is establish BF will become easier and that mom could "cover-up" if that was necessary, I also explained the risks and benefits of BF.  He is not supportive of mom's decision to BF at this point in time.  Patient Name: Debbie Hughes Date: 04/29/2014 Reason for consult: Initial assessment   Maternal Data Does the patient have breastfeeding experience prior to this delivery?: No  Feeding Feeding Type: Breast Fed Length of feed: 15 min  LATCH Score/Interventions Latch: Repeated attempts needed to sustain latch, nipple held in mouth throughout feeding, stimulation needed to elicit sucking reflex.  Audible Swallowing: A few with stimulation  Type of Nipple: Everted at rest and after stimulation  Comfort (Breast/Nipple): Soft / non-tender     Hold (Positioning): No assistance needed to correctly position infant at breast.  LATCH Score: 8  Lactation Tools Discussed/Used     Consult Status Consult Status: Follow-up    Van Clines 04/29/2014, 6:12 PM

## 2014-04-29 NOTE — Progress Notes (Addendum)
Subjective: Postpartum Day 0: Vaginal delivery, no laceration Patient up ad lib, reports no syncope or dizziness. Feeding:  Breast Contraceptive plan:  Micronor  Due to rapid labor, did not receive ATB for GBS.  Objective: Vital signs in last 24 hours: Temp:  [98.2 F (36.8 C)-99.1 F (37.3 C)] 99.1 F (37.3 C) (12/25 0330) Pulse Rate:  [65-98] 90 (12/25 0330) Resp:  [18-20] 18 (12/25 0330) BP: (133-158)/(80-92) 138/80 mmHg (12/25 0330) SpO2:  [98 %-100 %] 100 % (12/25 0330) Weight:  [174 lb (78.926 kg)] 174 lb (78.926 kg) (12/25 0032)   Filed Vitals:   04/29/14 0145 04/29/14 0200 04/29/14 0235 04/29/14 0330  BP: 149/88 148/91 150/87 138/80  Pulse: 78 77 65 90  Temp:   98.6 F (37 C) 99.1 F (37.3 C)  TempSrc:   Oral Oral  Resp: 20 18 18 18   Height:      Weight:      SpO2:   98% 100%   BP range during rapid labor:  148-156/84-92.   Physical Exam:  General: alert Lochia: appropriate Uterine Fundus: firm Perineum: Intact DVT Evaluation: No evidence of DVT seen on physical exam.  Negative Homan's    Recent Labs  04/28/14 2350 04/29/14 0505  HGB 12.6 12.3  HCT 36.1 35.4*    Assessment/Plan: Status post vaginal delivery day 0. Stable Mild BP elevation during rapid labor. Continue current care. Monitor BP pp. Anticipate d/c 05/01/14.     Allena Katz 04/29/2014, 7:21 AM

## 2014-04-29 NOTE — Progress Notes (Signed)
Blood pressure elevation reviewed with CNM via phone. Care provider is aware of blood pressures. No new orders received at this time. Deniece Ree, RN

## 2014-04-30 NOTE — Progress Notes (Signed)
Subjective: Postpartum Day 1: Vaginal delivery, no laceration Patient up ad lib, reports no syncope or dizziness. Feeding:  Breast Contraceptive plan:  Micronor  Objective: Vital signs in last 24 hours: Temp:  [98 F (36.7 C)-98.4 F (36.9 C)] 98.4 F (36.9 C) (12/26 0634) Pulse Rate:  [74-92] 74 (12/26 0634) Resp:  [16-18] 18 (12/26 0634) BP: (135-155)/(66-88) 155/66 mmHg (12/26 0634) SpO2:  [100 %] 100 % (12/26 2706)  Physical Exam:  General: alert Lochia: appropriate Uterine Fundus: firm Perineum: Intact DVT Evaluation: No evidence of DVT seen on physical exam. Negative Homan's sign.    Recent Labs  04/28/14 2350 04/29/14 0505  HGB 12.6 12.3  HCT 36.1 35.4*    Assessment/Plan: Status post vaginal delivery day 1. GBS positive Stable Continue current care. Plan for discharge tomorrow    Allena Katz 04/30/2014, 8:11 AM

## 2014-05-01 MED ORDER — OXYCODONE-ACETAMINOPHEN 5-325 MG PO TABS: 1.0000 | ORAL_TABLET | ORAL | Status: DC | PRN

## 2014-05-01 MED ORDER — IBUPROFEN 600 MG PO TABS: 600.0000 mg | ORAL_TABLET | Freq: Four times a day (QID) | ORAL | Status: DC

## 2014-05-01 MED ORDER — MEDROXYPROGESTERONE ACETATE 150 MG/ML IM SUSP
150.0000 mg | Freq: Once | INTRAMUSCULAR | Status: AC
  Administered 2014-05-01: 150 mg via INTRAMUSCULAR
  Filled 2014-05-01: qty 1

## 2014-05-01 NOTE — Discharge Summary (Signed)
Vaginal Delivery Discharge Summary  ALL information will be verified prior to discharge  Debbie Hughes  DOB:    [birth year] MRN:    [id number] CSN:    [id number]  Date of admission:                  04/29/14  Date of discharge:                   05/01/14  Procedures this admission: SVD  Date of Delivery: 04/29/14  Newborn Data:  Live born  Information for the patient's newborn:  STALLING, Girl Idalee [[id number]]  female  Live born female  Birth Weight: 5 lb 3.8 oz (2375 g) APGAR: 8, 9  Home with mother. Name: Debbie Hughes  History of Present Illness: Ms. Debbie Hughes is a 23 y.o. female, G2P1011, who presents at [redacted]w[redacted]d weeks gestation. The patient has been followed at the [facility] Gynecology division of [facility]. She was admitted rupture of membranes. Her pregnancy has been complicated by:  Patient Active Problem List   Diagnosis Date Noted  . Labor, precipitous, delivered 04/29/2014  . GBS (group B streptococcus) infection 04/29/2014  . Allergy to penicillin 09/22/2013  . Hx of ectopic pregnancy---s/p left salpingectomy 03/2013 09/22/2013  . Hyperemesis 09/21/2013    Hospital course: The patient was admitted for PROM.   Her labor was not complicated. She proceeded to have a vaginal delivery of a healthy infant. Her delivery was not complicated. Her postpartum course was not complicated. She was discharged to home on postpartum day 2 doing well.  Feeding: breast  Contraception: oral progesterone-only contraceptive with Depo prior to DC Pt understands the risks birth control are not limited to irregular bleeding, formation of DVT, fluid fluctuations, elevation in blood pressure, stroke, breast tenderness and liver damage.  She states she will report any serious side effects.  She has been given verbal and written instructions and voiced a clear understanding.    Discharge hemoglobin: HEMOGLOBIN  Date Value Ref Range Status    04/29/2014 12.3 12.0 - 15.0 g/dL Final   HCT  Date Value Ref Range Status  04/29/2014 35.4* 36.0 - 46.0 % Final    PreNatal Labs ABO, Rh: A/Positive/-- (06/26 0000)   Antibody: Negative (06/26 0000) Rubella:   immune RPR: NON REAC (12/24 2350)  HBsAg: Negative (06/26 0000)  HIV: Non-reactive (06/26 0000)  GBS: Positive (12/09 0000)  Discharge Physical Exam:  General: alert and cooperative Lochia: appropriate Uterine Fundus: firm Incision: n/a DVT Evaluation: No evidence of DVT seen on physical exam.  Intrapartum Procedures: spontaneous vaginal delivery Postpartum Procedures: none Complications-Operative and Postpartum: none  Discharge Diagnoses: Term Pregnancy-delivered  Activity:           pelvic rest Diet:                routine Medications: PNV, Ibuprofen and Percocet Condition:      stable     Postpartum Teaching: Nutrition, exercise, return to work or school, family visits, sexual activity, home rest, vaginal bleeding, pelvic rest, family planning, s/s of PPD, breast care peri-care and incision care   Discharge to: home  Follow-up Information    Follow up with [facility]. Schedule an appointment as soon as possible for a visit in 6 weeks.   Specialty:  Obstetrics and Gynecology   Why:  Postpartum check up   Contact information:   [address] [place] [address]  [place], CNM, MSN 05/01/2014. 8:30 AM   Postpartum Care After Vaginal Delivery  After you deliver your newborn (postpartum period), the usual stay in the hospital is 24 72 hours. If there were problems with your labor or delivery, or if you have other medical problems, you might be in the hospital longer.  While you are in the hospital, you will receive help and instructions on how to care for yourself and your newborn during the postpartum period.  While you are in the hospital:  Be sure to tell  your nurses if you have pain or discomfort, as well as where you feel the pain and what makes the pain worse.  If you had an incision made near your vagina (episiotomy) or if you had some tearing during delivery, the nurses may put ice packs on your episiotomy or tear. The ice packs may help to reduce the pain and swelling.  If you are breastfeeding, you may feel uncomfortable contractions of your uterus for a couple of weeks. This is normal. The contractions help your uterus get back to normal size.  It is normal to have some bleeding after delivery.  For the first 1 3 days after delivery, the flow is red and the amount may be similar to a period.  It is common for the flow to start and stop.  In the first few days, you may pass some small clots. Let your nurses know if you begin to pass large clots or your flow increases.  Do not  flush blood clots down the toilet before having the nurse look at them.  During the next 3 10 days after delivery, your flow should become more watery and pink or brown-tinged in color.  Ten to fourteen days after delivery, your flow should be a small amount of yellowish-white discharge.  The amount of your flow will decrease over the first few weeks after delivery. Your flow may stop in 6 8 weeks. Most women have had their flow stop by 12 weeks after delivery.  You should change your sanitary pads frequently.  Wash your hands thoroughly with soap and water for at least 20 seconds after changing pads, using the toilet, or before holding or feeding your newborn.  You should feel like you need to empty your bladder within the first 6 8 hours after delivery.  In case you become weak, lightheaded, or faint, call your nurse before you get out of bed for the first time and before you take a shower for the first time.  Within the first few days after delivery, your breasts may begin to feel tender and full. This is called engorgement. Breast tenderness usually goes  away within 48 72 hours after engorgement occurs. You may also notice milk leaking from your breasts. If you are not breastfeeding, do not stimulate your breasts. Breast stimulation can make your breasts produce more milk.  Spending as much time as possible with your newborn is very important. During this time, you and your newborn can feel close and get to know each other. Having your newborn stay in your room (rooming in) will help to strengthen the bond with your newborn. It will give you time to get to know your newborn and become comfortable caring for your newborn.  Your hormones change after delivery. Sometimes the hormone changes can temporarily cause you to feel sad or tearful. These feelings should not last more than a few days. If these feelings  last longer than that, you should talk to your caregiver.  If desired, talk to your caregiver about methods of family planning or contraception.  Talk to your caregiver about immunizations. Your caregiver may want you to have the following immunizations before leaving the hospital:  Tetanus, diphtheria, and pertussis (Tdap) or tetanus and diphtheria (Td) immunization. It is very important that you and your family (including grandparents) or others caring for your newborn are up-to-date with the Tdap or Td immunizations. The Tdap or Td immunization can help protect your newborn from getting ill.  Rubella immunization.  Varicella (chickenpox) immunization.  Influenza immunization. You should receive this annual immunization if you did not receive the immunization during your pregnancy. Document Released: 02/17/2007 Document Revised: 01/15/2012 Document Reviewed: 12/18/2011 [facility] Patient Information 2014 [place].   Postpartum Depression and Baby Blues  The postpartum period begins right after the birth of a baby. During this time, there is often a great amount of joy and excitement. It is also a time of considerable changes in the  life of the parent(s). Regardless of how many times a mother gives birth, each child brings new challenges and dynamics to the family. It is not unusual to have feelings of excitement accompanied by confusing shifts in moods, emotions, and thoughts. All mothers are at risk of developing postpartum depression or the "baby blues." These mood changes can occur right after giving birth, or they may occur many months after giving birth. The baby blues or postpartum depression can be mild or severe. Additionally, postpartum depression can resolve rather quickly, or it can be a long-term condition. CAUSES Elevated hormones and their rapid decline are thought to be a main cause of postpartum depression and the baby blues. There are a number of hormones that radically change during and after pregnancy. Estrogen and progesterone usually decrease immediately after delivering your baby. The level of thyroid hormone and various cortisol steroids also rapidly drop. Other factors that play a major role in these changes include major life events and genetics.  RISK FACTORS If you have any of the following risks for the baby blues or postpartum depression, know what symptoms to watch out for during the postpartum period. Risk factors that may increase the likelihood of getting the baby blues or postpartum depression include: 1. Havinga personal or family history of depression. 2. Having depression while being pregnant. 3. Having premenstrual or oral contraceptive-associated mood issues. 4. Having exceptional life stress. 5. Having marital conflict. 6. Lacking a social support network. 7. Having a baby with special needs. 8. Having health problems such as diabetes. SYMPTOMS Baby blues symptoms include:  Brief fluctuations in mood, such as going from extreme happiness to sadness.  Decreased concentration.  Difficulty sleeping.  Crying spells, tearfulness.  Irritability.  Anxiety. Postpartum depression  symptoms typically begin within the first month after giving birth. These symptoms include:  Difficulty sleeping or excessive sleepiness.  Marked weight loss.  Agitation.  Feelings of worthlessness.  Lack of interest in activity or food. Postpartum psychosis is a very concerning condition and can be dangerous. Fortunately, it is rare. Displaying any of the following symptoms is cause for immediate medical attention. Postpartum psychosis symptoms include:  Hallucinations and delusions.  Bizarre or disorganized behavior.  Confusion or disorientation. DIAGNOSIS  A diagnosis is made by an evaluation of your symptoms. There are no medical or lab tests that lead to a diagnosis, but there are various questionnaires that a caregiver may use to identify those with the baby  blues, postpartum depression, or psychosis. Often times, a screening tool called the [place] Postnatal Depression Scale is used to diagnose depression in the postpartum period.  TREATMENT The baby blues usually goes away on its own in 1 to 2 weeks. Social support is often all that is needed. You should be encouraged to get adequate sleep and rest. Occasionally, you may be given medicines to help you sleep.  Postpartum depression requires treatment as it can last several months or longer if it is not treated. Treatment may include individual or group therapy, medicine, or both to address any social, physiological, and psychological factors that may play a role in the depression. Regular exercise, a healthy diet, rest, and social support may also be strongly recommended.  Postpartum psychosis is more serious and needs treatment right away. Hospitalization is often needed. HOME CARE INSTRUCTIONS  Get as much rest as you can. Nap when the baby sleeps.  Exercise regularly. Some women find yoga and walking to be beneficial.  Eat a balanced and nourishing diet.  Do little things that you enjoy. Have a cup of tea, take a bubble  bath, read your favorite magazine, or listen to your favorite music.  Avoid alcohol.  Ask for help with household chores, cooking, grocery shopping, or running errands as needed. Do not try to do everything.  Talk to people close to you about how you are feeling. Get support from your partner, family members, friends, or other new moms.  Try to stay positive in how you think. Think about the things you are grateful for.  Do not spend a lot of time alone.  Only take medicine as directed by your caregiver.  Keep all your postpartum appointments.  Let your caregiver know if you have any concerns. SEEK MEDICAL CARE IF: You are having a reaction or problems with your medicine. SEEK IMMEDIATE MEDICAL CARE IF:  You have suicidal feelings.  You feel you may harm the baby or someone else. Document Released: 01/25/2004 Document Revised: 07/15/2011 Document Reviewed: 02/26/2011 [facility] Patient Information 2014 [place], [place].     Breastfeeding Deciding to breastfeed is one of the best choices you can make for you and your baby. A change in hormones during pregnancy causes your breast tissue to grow and increases the number and size of your milk ducts. These hormones also allow proteins, sugars, and fats from your blood supply to make breast milk in your milk-producing glands. Hormones prevent breast milk from being released before your baby is born as well as prompt milk flow after birth. Once breastfeeding has begun, thoughts of your baby, as well as his or her sucking or crying, can stimulate the release of milk from your milk-producing glands.  BENEFITS OF BREASTFEEDING For Your Baby  Your first milk (colostrum) helps your baby's digestive system function better.   There are antibodies in your milk that help your baby fight off infections.   Your baby has a lower incidence of asthma, allergies, and sudden infant death syndrome.   The nutrients in breast milk are better for your  baby than infant formulas and are designed uniquely for your baby's needs.   Breast milk improves your baby's brain development.   Your baby is less likely to develop other conditions, such as childhood obesity, asthma, or type 2 diabetes mellitus.  For You   Breastfeeding helps to create a very special bond between you and your baby.   Breastfeeding is convenient. Breast milk is always available at the correct temperature  and costs nothing.   Breastfeeding helps to burn calories and helps you lose the weight gained during pregnancy.   Breastfeeding makes your uterus contract to its prepregnancy size faster and slows bleeding (lochia) after you give birth.   Breastfeeding helps to lower your risk of developing type 2 diabetes mellitus, osteoporosis, and breast or ovarian cancer later in life. SIGNS THAT YOUR BABY IS HUNGRY Early Signs of Hunger  Increased alertness or activity.  Stretching.  Movement of the head from side to side.  Movement of the head and opening of the mouth when the corner of the mouth or cheek is stroked (rooting).  Increased sucking sounds, smacking lips, cooing, sighing, or squeaking.  Hand-to-mouth movements.  Increased sucking of fingers or hands. Late Signs of Hunger  Fussing.  Intermittent crying. Extreme Signs of Hunger Signs of extreme hunger will require calming and consoling before your baby will be able to breastfeed successfully. Do not wait for the following signs of extreme hunger to occur before you initiate breastfeeding:   Restlessness.  A loud, strong cry.   Screaming.   BREASTFEEDING BASICS Breastfeeding Initiation  Find a comfortable place to sit or lie down, with your neck and back well supported.  Place a pillow or rolled up blanket under your baby to bring him or her to the level of your breast (if you are seated). Nursing pillows are specially designed to help support your arms and your baby while you  breastfeed.  Make sure that your baby's abdomen is facing your abdomen.   Gently massage your breast. With your fingertips, massage from your chest wall toward your nipple in a circular motion. This encourages milk flow. You may need to continue this action during the feeding if your milk flows slowly.  Support your breast with 4 fingers underneath and your thumb above your nipple. Make sure your fingers are well away from your nipple and your baby's mouth.   Stroke your baby's lips gently with your finger or nipple.   When your baby's mouth is open wide enough, quickly bring your baby to your breast, placing your entire nipple and as much of the colored area around your nipple (areola) as possible into your baby's mouth.   More areola should be visible above your baby's upper lip than below the lower lip.   Your baby's tongue should be between his or her lower gum and your breast.   Ensure that your baby's mouth is correctly positioned around your nipple (latched). Your baby's lips should create a seal on your breast and be turned out (everted).  It is common for your baby to suck about 2-3 minutes in order to start the flow of breast milk. Latching Teaching your baby how to latch on to your breast properly is very important. An improper latch can cause nipple pain and decreased milk supply for you and poor weight gain in your baby. Also, if your baby is not latched onto your nipple properly, he or she may swallow some air during feeding. This can make your baby fussy. Burping your baby when you switch breasts during the feeding can help to get rid of the air. However, teaching your baby to latch on properly is still the best way to prevent fussiness from swallowing air while breastfeeding. Signs that your baby has successfully latched on to your nipple:    Silent tugging or silent sucking, without causing you pain.   Swallowing heard between every 3-4 sucks.    Muscle  movement  above and in front of his or her ears while sucking.  Signs that your baby has not successfully latched on to nipple:   Sucking sounds or smacking sounds from your baby while breastfeeding.  Nipple pain. If you think your baby has not latched on correctly, slip your finger into the corner of your baby's mouth to break the suction and place it between your baby's gums. Attempt breastfeeding initiation again. Signs of Successful Breastfeeding Signs from your baby:   A gradual decrease in the number of sucks or complete cessation of sucking.   Falling asleep.   Relaxation of his or her body.   Retention of a small amount of milk in his or her mouth.   Letting go of your breast by himself or herself. Signs from you:  Breasts that have increased in firmness, weight, and size 1-3 hours after feeding.   Breasts that are softer immediately after breastfeeding.  Increased milk volume, as well as a change in milk consistency and color by the fifth day of breastfeeding.   Nipples that are not sore, cracked, or bleeding. Signs That Your [place] is Getting Enough Milk  Wetting at least 3 diapers in a 24-hour period. The urine should be clear and pale yellow by age 158 days.  At least 3 stools in a 24-hour period by age 158 days. The stool should be soft and yellow.  At least 3 stools in a 24-hour period by age 15 days. The stool should be seedy and yellow.  No loss of weight greater than 10% of birth weight during the first 21 days of age.  Average weight gain of 4-7 ounces (113-198 g) per week after age 28 days.  Consistent daily weight gain by age 82 days, without weight loss after the age of 2 weeks. After a feeding, your baby may spit up a small amount. This is common. BREASTFEEDING FREQUENCY AND DURATION Frequent feeding will help you make more milk and can prevent sore nipples and breast engorgement. Breastfeed when you feel the need to reduce the fullness of your breasts or when  your baby shows signs of hunger. This is called "breastfeeding on demand." Avoid introducing a pacifier to your baby while you are working to establish breastfeeding (the first 4-6 weeks after your baby is born). After this time you may choose to use a pacifier. Research has shown that pacifier use during the first year of a baby's life decreases the risk of sudden infant death syndrome (SIDS). Allow your baby to feed on each breast as long as he or she wants. Breastfeed until your baby is finished feeding. When your baby unlatches or falls asleep while feeding from the first breast, offer the second breast. Because newborns are often sleepy in the first few weeks of life, you may need to awaken your baby to get him or her to feed. Breastfeeding times will vary from baby to baby. However, the following rules can serve as a guide to help you ensure that your baby is properly fed:  Newborns (babies 70 weeks of age or younger) may breastfeed every 1-3 hours.  Newborns should not go longer than 3 hours during the day or 5 hours during the night without breastfeeding.  You should breastfeed your baby a minimum of 8 times in a 24-hour period until you begin to introduce solid foods to your baby at around 89 months of age. BREAST MILK PUMPING Pumping and storing breast milk allows you to ensure that  your baby is exclusively fed your breast milk, even at times when you are unable to breastfeed. This is especially important if you are going back to work while you are still breastfeeding or when you are not able to be present during feedings. Your lactation consultant can give you guidelines on how long it is safe to store breast milk.  A breast pump is a machine that allows you to pump milk from your breast into a sterile bottle. The pumped breast milk can then be stored in a refrigerator or freezer. Some breast pumps are operated by hand, while others use electricity. Ask your lactation consultant which type will  work best for you. Breast pumps can be purchased, but some hospitals and breastfeeding support groups lease breast pumps on a monthly basis. A lactation consultant can teach you how to hand express breast milk, if you prefer not to use a pump.  CARING FOR YOUR BREASTS WHILE YOU BREASTFEED Nipples can become dry, cracked, and sore while breastfeeding. The following recommendations can help keep your breasts moisturized and healthy:  Avoid using soap on your nipples.   Wear a supportive bra. Although not required, special nursing bras and tank tops are designed to allow access to your breasts for breastfeeding without taking off your entire bra or top. Avoid wearing underwire-style bras or extremely tight bras.  Air dry your nipples for 3-26minutes after each feeding.   Use only cotton bra pads to absorb leaked breast milk. Leaking of breast milk between feedings is normal.   Use lanolin on your nipples after breastfeeding. Lanolin helps to maintain your skin's normal moisture barrier. If you use pure lanolin, you do not need to wash it off before feeding your baby again. Pure lanolin is not toxic to your baby. You may also hand express a few drops of breast milk and gently massage that milk into your nipples and allow the milk to air dry. In the first few weeks after giving birth, some women experience extremely full breasts (engorgement). Engorgement can make your breasts feel heavy, warm, and tender to the touch. Engorgement peaks within 3-5 days after you give birth. The following recommendations can help ease engorgement:  Completely empty your breasts while breastfeeding or pumping. You may want to start by applying warm, moist heat (in the shower or with warm water-soaked hand towels) just before feeding or pumping. This increases circulation and helps the milk flow. If your baby does not completely empty your breasts while breastfeeding, pump any extra milk after he or she is  finished.  Wear a snug bra (nursing or regular) or tank top for 1-2 days to signal your body to slightly decrease milk production.  Apply ice packs to your breasts, unless this is too uncomfortable for you.  Make sure that your baby is latched on and positioned properly while breastfeeding. If engorgement persists after 48 hours of following these recommendations, contact your health care provider or a Science writer. OVERALL HEALTH CARE RECOMMENDATIONS WHILE BREASTFEEDING  Eat healthy foods. Alternate between meals and snacks, eating 3 of each per day. Because what you eat affects your breast milk, some of the foods may make your baby more irritable than usual. Avoid eating these foods if you are sure that they are negatively affecting your baby.  Drink milk, fruit juice, and water to satisfy your thirst (about 10 glasses a day).   Rest often, relax, and continue to take your prenatal vitamins to prevent fatigue, stress, and anemia.  Continue breast self-awareness checks.  Avoid chewing and smoking tobacco.  Avoid alcohol and drug use. Some medicines that may be harmful to your baby can pass through breast milk. It is important to ask your health care provider before taking any medicine, including all over-the-counter and prescription medicine as well as vitamin and herbal supplements. It is possible to become pregnant while breastfeeding. If birth control is desired, ask your health care provider about options that will be safe for your baby. SEEK MEDICAL CARE IF:   You feel like you want to stop breastfeeding or have become frustrated with breastfeeding.  You have painful breasts or nipples.  Your nipples are cracked or bleeding.  Your breasts are red, tender, or warm.  You have a swollen area on either breast.  You have a fever or chills.  You have nausea or vomiting.  You have drainage other than breast milk from your nipples.  Your breasts do not become full  before feedings by the fifth day after you give birth.  You feel sad and depressed.  Your baby is too sleepy to eat well.  Your baby is having trouble sleeping.   Your baby is wetting less than 3 diapers in a 24-hour period.  Your baby has less than 3 stools in a 24-hour period.  Your baby's skin or the white part of his or her eyes becomes yellow.   Your baby is not gaining weight by 12 days of age. SEEK IMMEDIATE MEDICAL CARE IF:   Your baby is overly tired (lethargic) and does not want to wake up and feed.  Your baby develops an unexplained fever. Document Released: 04/22/2005 Document Revised: 04/27/2013 Document Reviewed: 10/14/2012 [facility] Patient Information 2015 [place], [place]. This information is not intended to replace advice given to you by your health care provider. Make sure you discuss any questions you have with your health care provider.

## 2014-05-01 NOTE — Lactation Note (Signed)
This note was copied from the chart of Debbie Lachandra Baer. Lactation Consultation Note  Mother's breasts are filling.  Reviewed engorgement care. Discussed many benefits of breastfeeding.  FOB becoming more supportive of bf. Reviewed supply, demand, pumping, milk storage and monitoring voids/stools. Mom encouraged to feed baby every 3 hours. Encouraged parents to call if further assistance is needed.  Patient Name: Debbie Hughes AESLP'N Date: 05/01/2014     Maternal Data    Feeding    LATCH Score/Interventions                      Lactation Tools Discussed/Used     Consult Status      Vivianne Master [facility] 05/01/2014, 11:30 AM

## 2015-11-09 IMAGING — US US OB COMP LESS 14 WK
1 series · 13 of 28 positions shown · non-contrast
Comparison: None.

CLINICAL DATA: Pelvic pain with nausea and vomiting, known first
trimester pregnancy ; history of previous ectopic pregnancy and
subsequent left salpingectomy

EXAM:
OBSTETRIC <14 WK US AND TRANSVAGINAL OB US
TECHNIQUE: Both transabdominal and transvaginal ultrasound examinations were
performed for complete evaluation of the gestation as well as the
maternal uterus, adnexal regions, and pelvic cul-de-sac.
Transvaginal technique was performed to assess early pregnancy.

[Series 1: us ob comp less 14 wk · 0.21mm/px · 68 acquisitions, 13 frames shown]
[im 3/68]
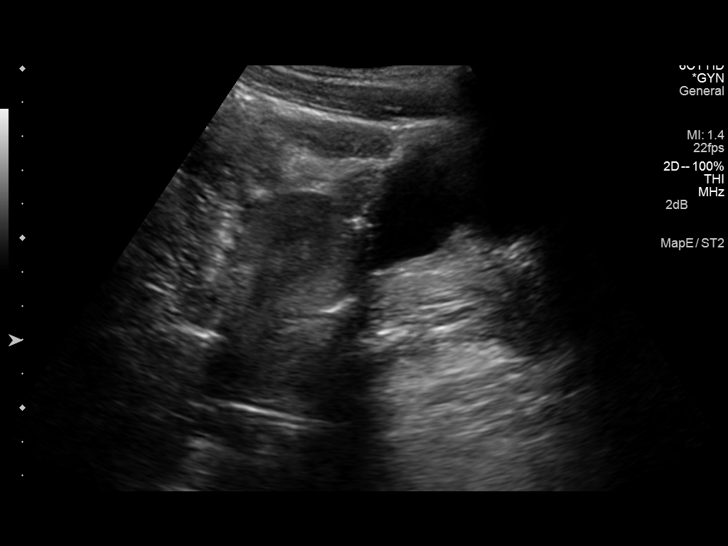
[im 8/68]
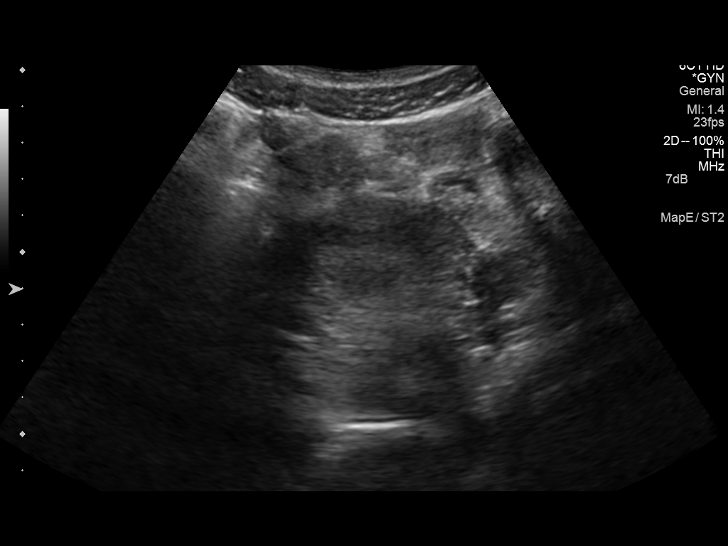
[im 13/68]
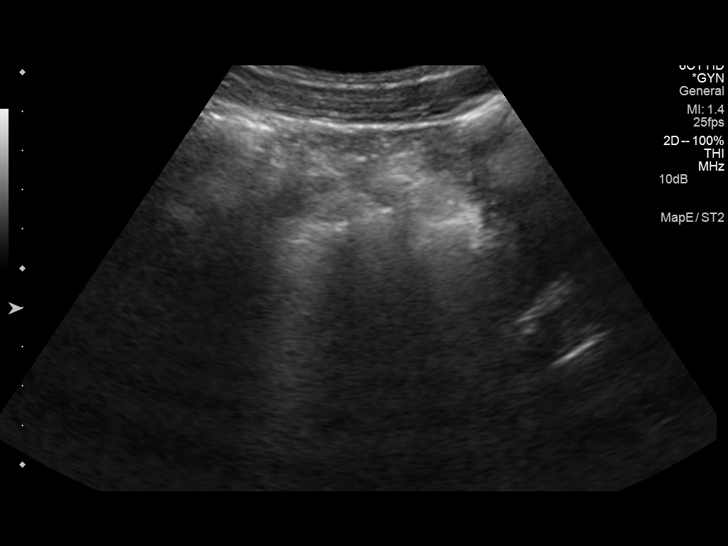
[im 18/68]
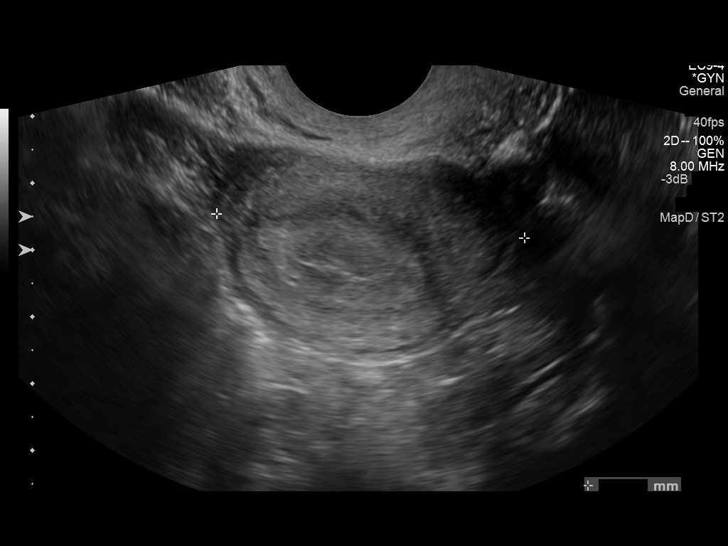
[im 23/68]
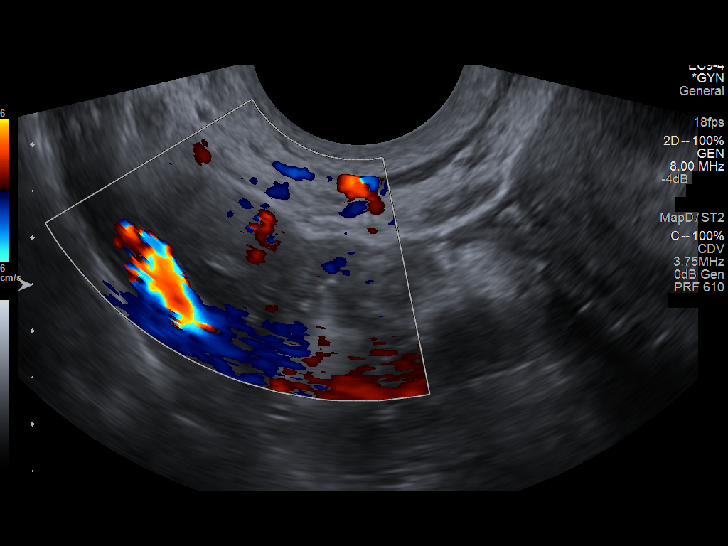
[im 28/68]
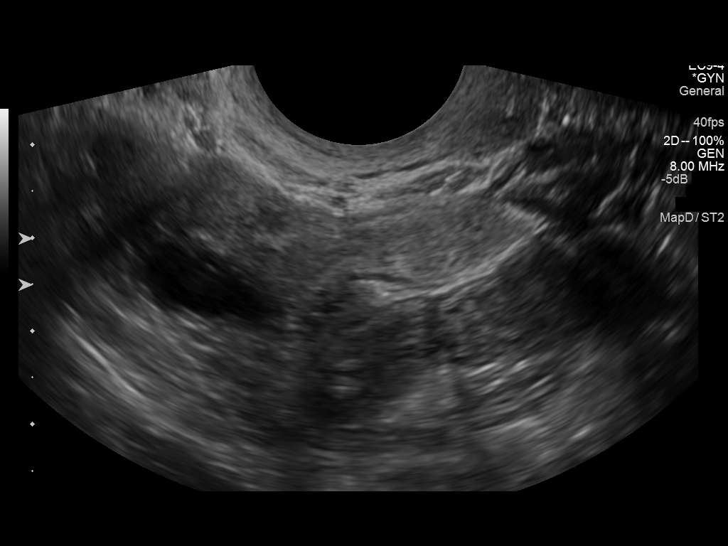
[im 35/68]
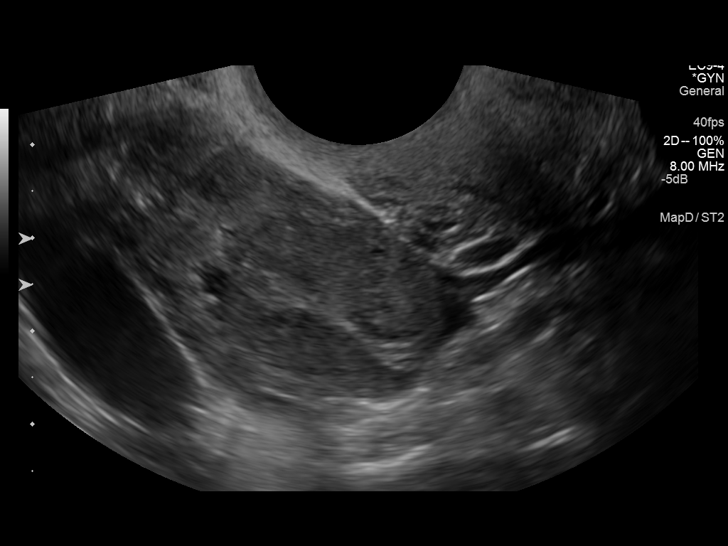
[im 40/68]
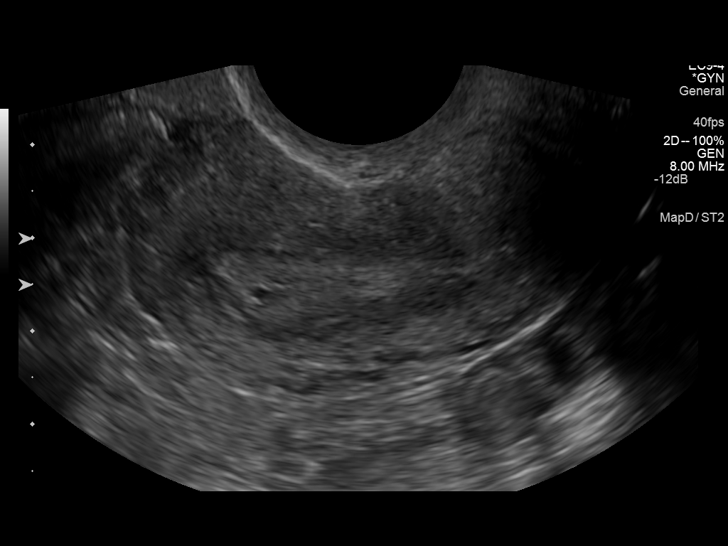
[im 45/68]
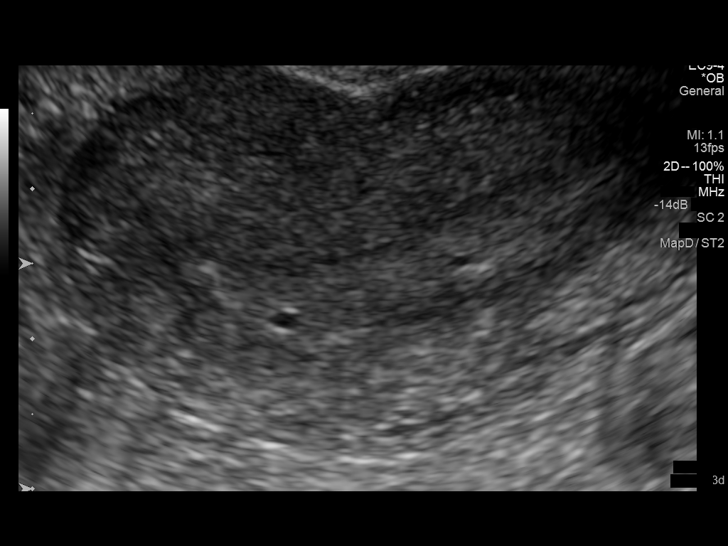
[im 50/68]
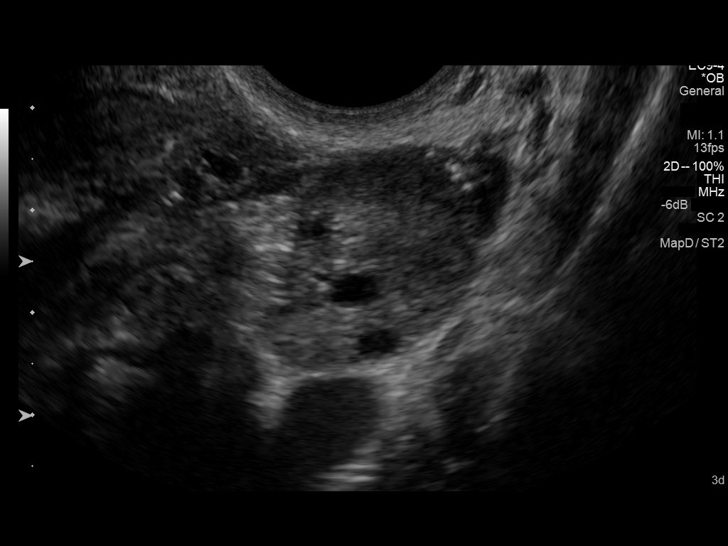
[im 55/68]
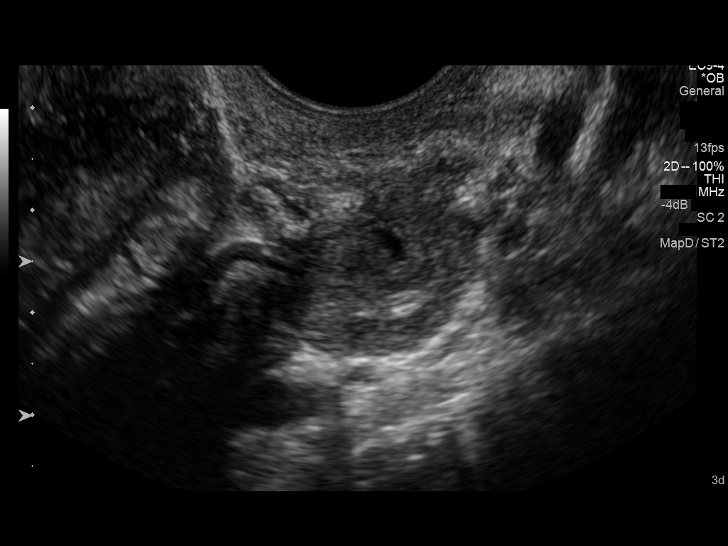
[im 60/68]
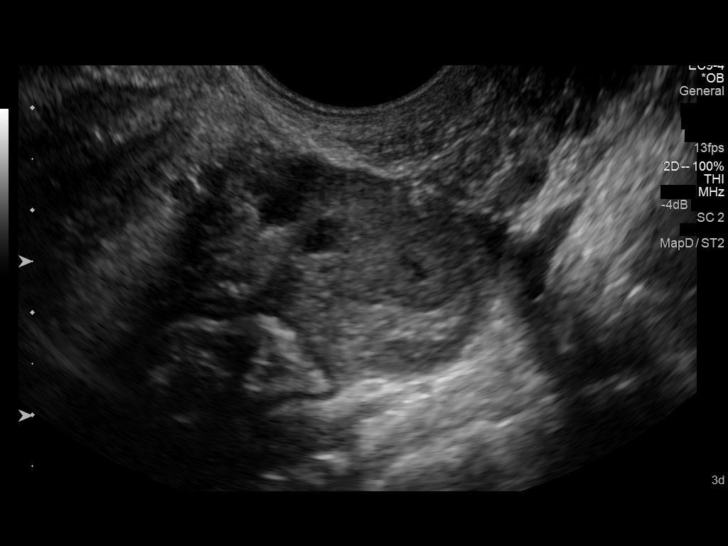
[im 65/68]
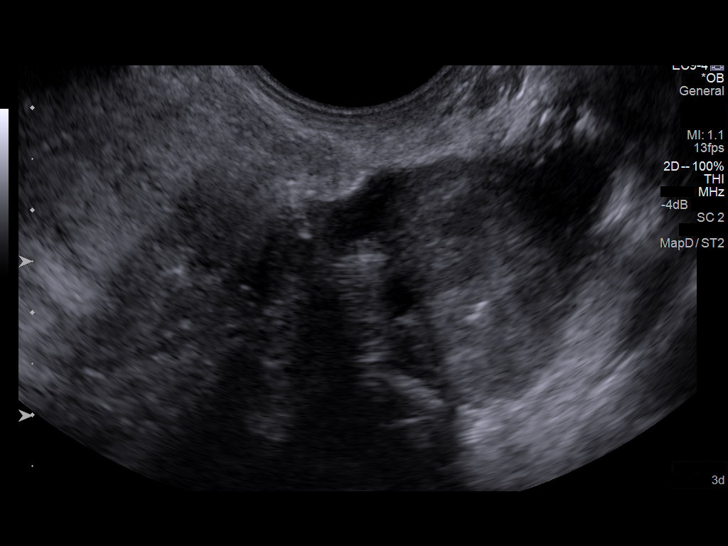

[13 of 28 positions shown; findings below may reference images not displayed]

FINDINGS: Intrauterine gestational sac: None visualized.

Yolk sac:  None

Embryo:  None

Cardiac Activity: None

Positive urine pregnancy test. Gestational age by LMP is 4 weeks 3
days corresponding to an anticipated date of delivery May 07, 2014

Maternal uterus/adnexae: The endometrial stripe measures just over
14 mm. The right ovary measures 2.2 x 1.3 x 1.6 cm. The left ovary
measures 3.5 x 2.2 x 2.0 cm. In the left adnexal region just
inferior to the left ovary there is a complex appearing structure
measuring 0.8 0.7 x 0.7 cm. It is not hypervascular. There is a
moderate amount of free fluid in the cul-de-sac.
IMPRESSION: 1. There is no evidence of an IUP.
2. A complex focus in the left adnexal region measures 0.8 cm in
greatest dimension and is not hypervascular. This is a nonspecific
finding but ectopic pregnancy is not excluded. There is a moderate
amount of free fluid in the cul-de-sac.
3. Serial follow-up beta HCG determinations as well as follow-up
ultrasound examinations will be needed.

## 2015-11-11 IMAGING — US US ABDOMEN LIMITED
1 series · 14 of 25 positions shown · non-contrast
Comparison: No

CLINICAL DATA: Nausea, vomiting, upper epigastric pain

EXAM:
US ABDOMEN LIMITED - RIGHT UPPER QUADRANT

[Series 1: us abdomen limited · 14 of 43 slices shown]
[im 1/43]
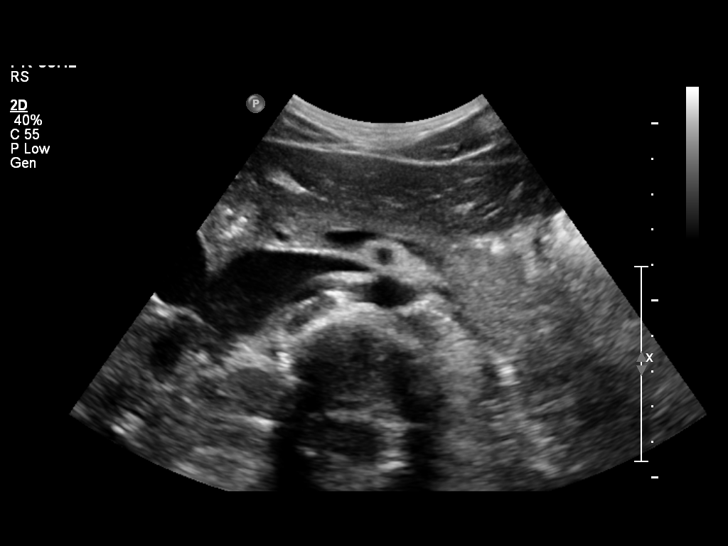
[im 4/43]
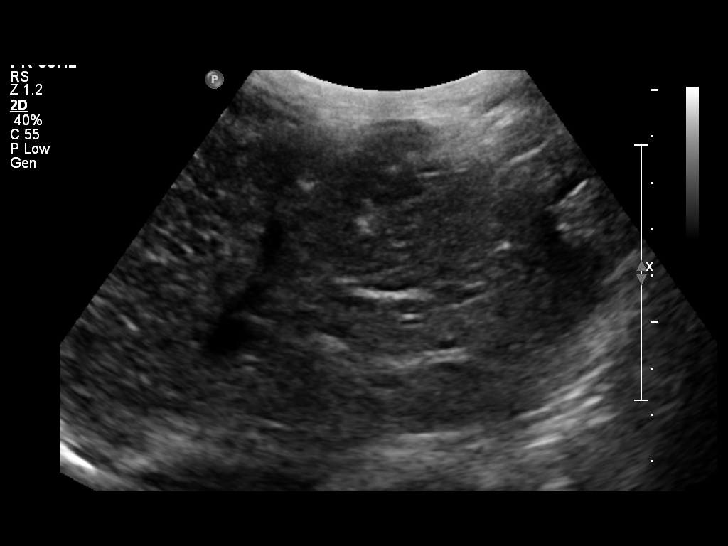
[im 8/43]
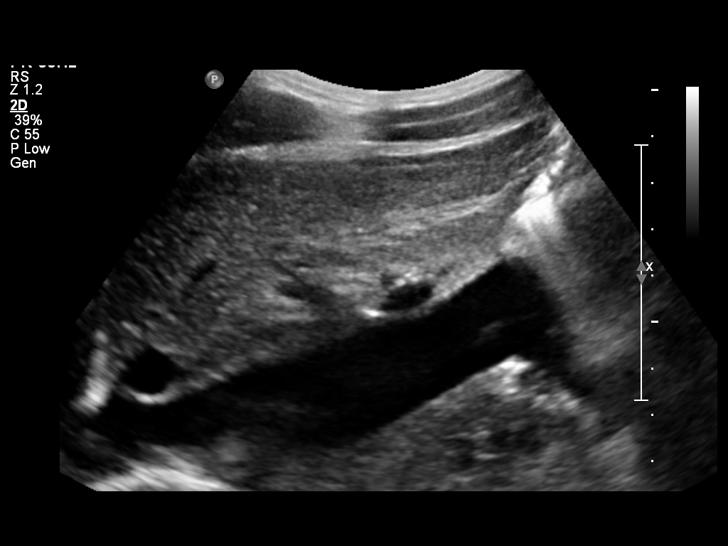
[im 11/43]
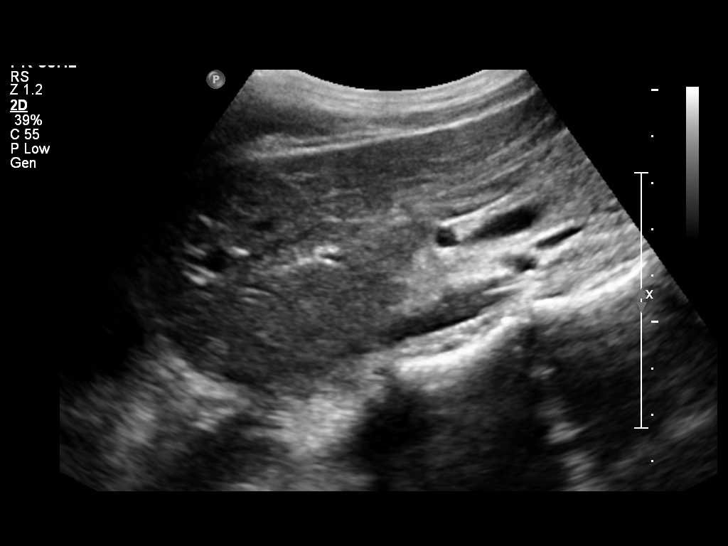
[im 15/43]
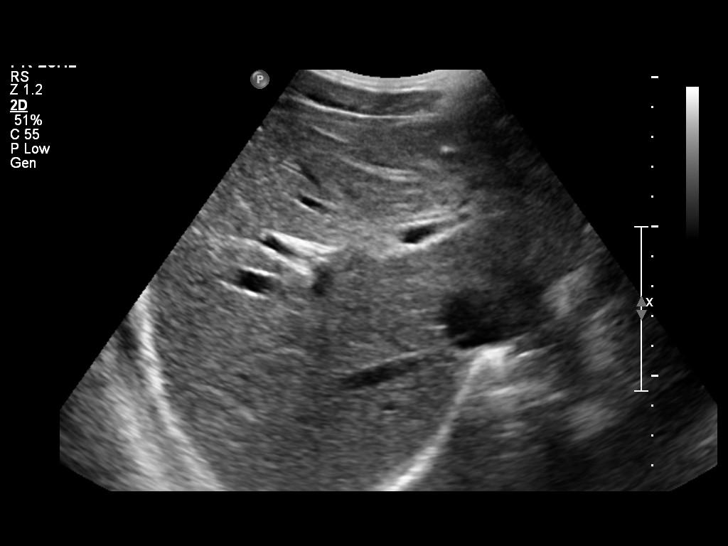
[im 16/43]
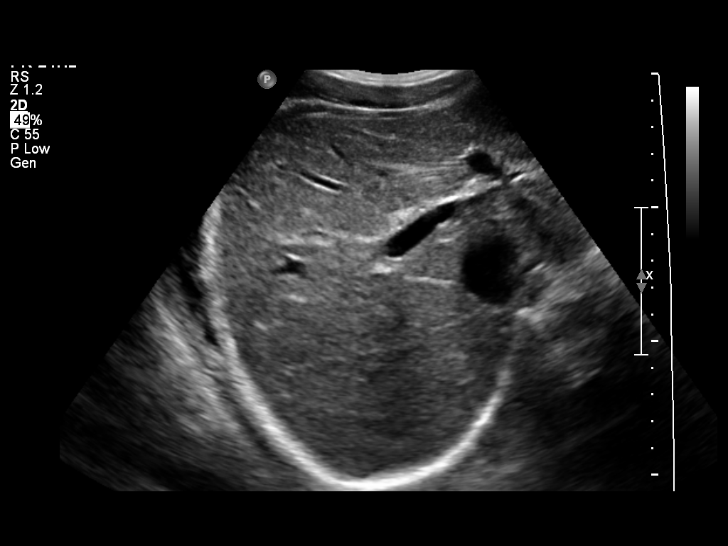
[im 20/43]
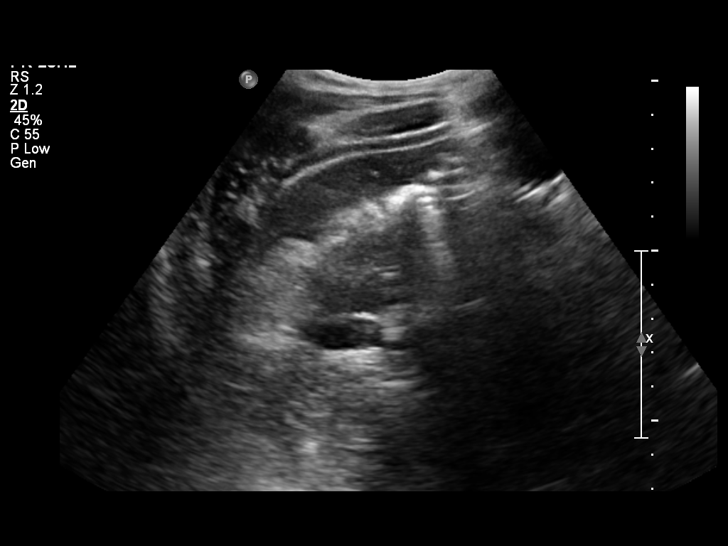
[im 23/43]
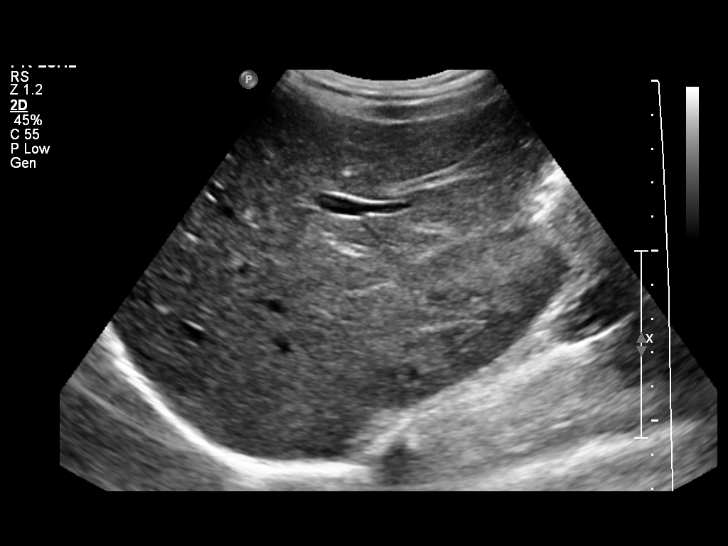
[im 27/43]
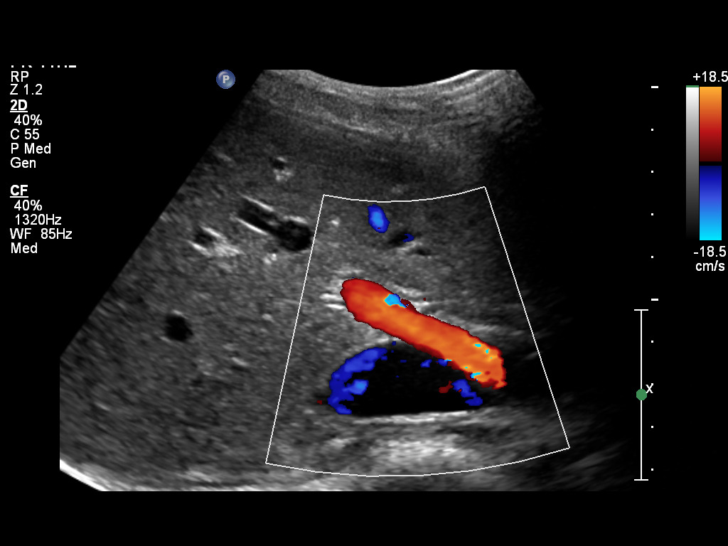
[im 29/43]
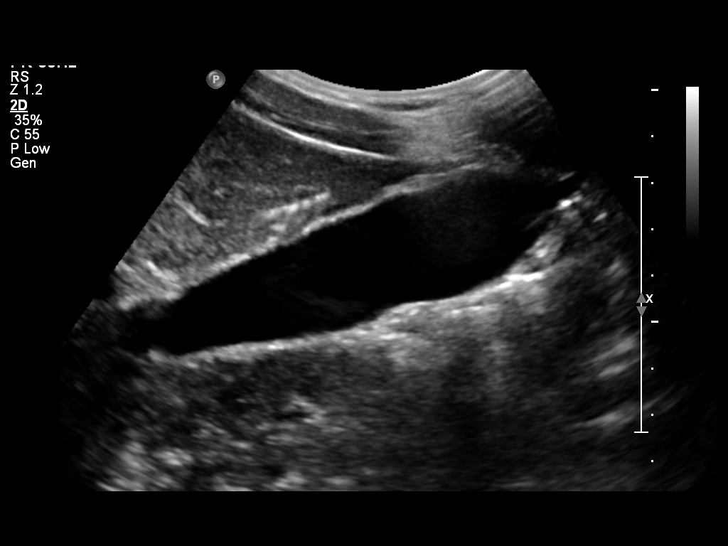
[im 32/43]
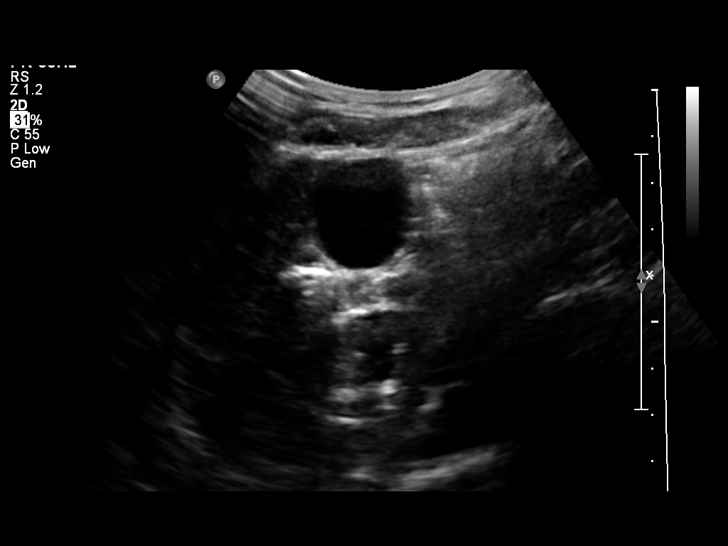
[im 36/43]
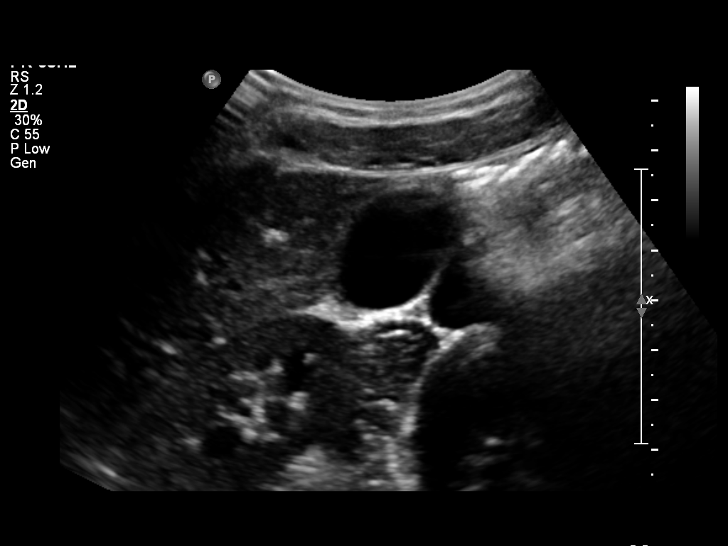
[im 39/43]
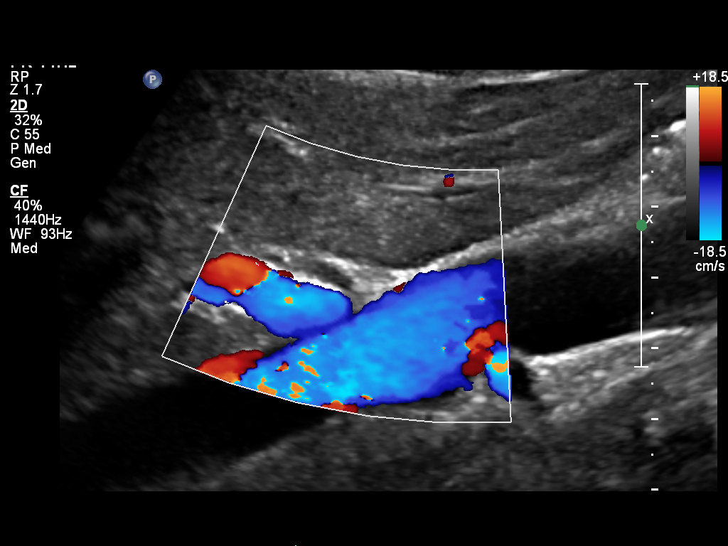
[im 43/43]
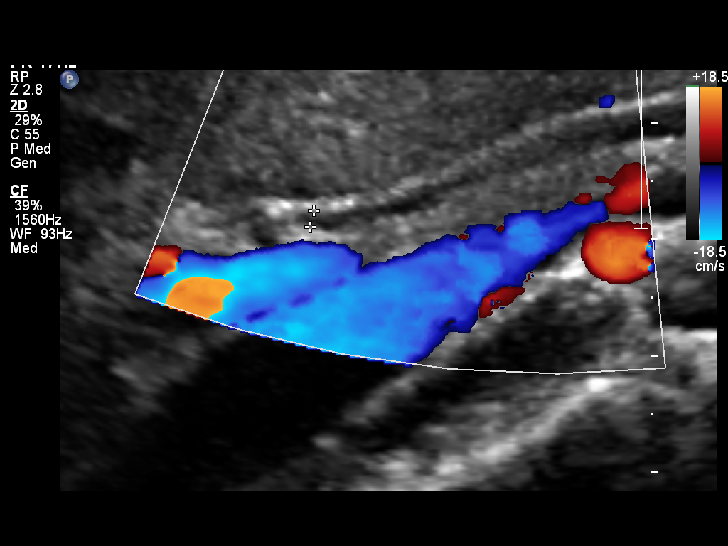

[14 of 25 positions shown; findings below may reference images not displayed]

FINDINGS: Gallbladder:

Normally distended without stones or wall thickening.

No pericholecystic fluid or sonographic Murphy sign.

Common bile duct:

Diameter: Normal caliber 1.4 mm diameter

Liver:

Normal appearance

No RIGHT upper quadrant free fluid.
IMPRESSION: Normal exam

## 2016-02-28 ENCOUNTER — Inpatient Hospital Stay (HOSPITAL_COMMUNITY)
Admission: AD | Admit: 2016-02-28 | Discharge: 2016-02-28 | Disposition: A | Discharge status: Home / Self Care | Payer: BC Managed Care – PPO | Source: Ambulatory Visit | Attending: Emergency Medicine | Admitting: Emergency Medicine

## 2016-02-28 ENCOUNTER — Inpatient Hospital Stay (HOSPITAL_COMMUNITY): Payer: BC Managed Care – PPO

## 2016-02-28 ENCOUNTER — Encounter (HOSPITAL_COMMUNITY): Payer: Self-pay | Admitting: *Deleted

## 2016-02-28 DIAGNOSIS — Z87891 Personal history of nicotine dependence: Secondary | ICD-10-CM | POA: Insufficient documentation

## 2016-02-28 DIAGNOSIS — R1010 Upper abdominal pain, unspecified: Secondary | ICD-10-CM

## 2016-02-28 DIAGNOSIS — R1030 Lower abdominal pain, unspecified: Secondary | ICD-10-CM | POA: Insufficient documentation

## 2016-02-28 DIAGNOSIS — R112 Nausea with vomiting, unspecified: Secondary | ICD-10-CM | POA: Diagnosis not present

## 2016-02-28 LAB — AMYLASE: Amylase: 79 U/L (ref 28–100)

## 2016-02-28 LAB — CBC WITH DIFFERENTIAL/PLATELET
BASOS ABS: 0 10*3/uL (ref 0.0–0.1)
BASOS PCT: 0 %
EOS ABS: 0 10*3/uL (ref 0.0–0.7)
EOS PCT: 0 %
HCT: 40.7 % (ref 36.0–46.0)
HEMOGLOBIN: 14.3 g/dL (ref 12.0–15.0)
Lymphocytes Relative: 14 %
Lymphs Abs: 1.4 10*3/uL (ref 0.7–4.0)
MCH: 32.7 pg (ref 26.0–34.0)
MCHC: 35.1 g/dL (ref 30.0–36.0)
MCV: 93.1 fL (ref 78.0–100.0)
Monocytes Absolute: 0.2 10*3/uL (ref 0.1–1.0)
Monocytes Relative: 2 %
NEUTROS PCT: 84 %
Neutro Abs: 8.3 10*3/uL — ABNORMAL HIGH (ref 1.7–7.7)
Platelets: 232 10*3/uL (ref 150–400)
RBC: 4.37 MIL/uL (ref 3.87–5.11)
RDW: 12.2 % (ref 11.5–15.5)
WBC: 9.9 10*3/uL (ref 4.0–10.5)

## 2016-02-28 LAB — COMPREHENSIVE METABOLIC PANEL
ALBUMIN: 4.2 g/dL (ref 3.5–5.0)
ALT: 25 U/L (ref 14–54)
AST: 34 U/L (ref 15–41)
Alkaline Phosphatase: 46 U/L (ref 38–126)
Anion gap: 12 (ref 5–15)
BILIRUBIN TOTAL: 0.9 mg/dL (ref 0.3–1.2)
BUN: 12 mg/dL (ref 6–20)
CHLORIDE: 104 mmol/L (ref 101–111)
CO2: 18 mmol/L — ABNORMAL LOW (ref 22–32)
Calcium: 9 mg/dL (ref 8.9–10.3)
Creatinine, Ser: 0.74 mg/dL (ref 0.44–1.00)
GFR calc Af Amer: >60 mL/min (ref >60)
GFR calc non Af Amer: >60 mL/min (ref >60)
GLUCOSE: 126 mg/dL — AB (ref 65–99)
POTASSIUM: 3.3 mmol/L — AB (ref 3.5–5.1)
SODIUM: 134 mmol/L — AB (ref 135–145)
Total Protein: 7.2 g/dL (ref 6.5–8.1)

## 2016-02-28 LAB — URINALYSIS, ROUTINE W REFLEX MICROSCOPIC
Bilirubin Urine: NEGATIVE
Glucose, UA: NEGATIVE mg/dL
Hgb urine dipstick: NEGATIVE
KETONES UR: 15 mg/dL — AB
LEUKOCYTES UA: NEGATIVE
NITRITE: NEGATIVE
PROTEIN: NEGATIVE mg/dL
Specific Gravity, Urine: 1.02 (ref 1.005–1.030)
pH: 8.5 — ABNORMAL HIGH (ref 5.0–8.0)

## 2016-02-28 LAB — HCG, QUANTITATIVE, PREGNANCY: HCG, BETA CHAIN, QUANT, S: 1 m[IU]/mL (ref <5)

## 2016-02-28 LAB — POCT PREGNANCY, URINE: Preg Test, Ur: NEGATIVE

## 2016-02-28 LAB — LIPASE, BLOOD: LIPASE: 35 U/L (ref 11–51)

## 2016-02-28 MED ORDER — SODIUM CHLORIDE 0.9 % IV SOLN
8.0000 mg | Freq: Once | INTRAVENOUS | Status: AC
  Administered 2016-02-28: 8 mg via INTRAVENOUS
  Filled 2016-02-28: qty 4

## 2016-02-28 MED ORDER — ONDANSETRON HCL 4 MG/2ML IJ SOLN: 4.0000 mg | Freq: Once | INTRAMUSCULAR | Status: DC

## 2016-02-28 MED ORDER — OXYCODONE-ACETAMINOPHEN 5-325 MG PO TABS: 1.0000 | ORAL_TABLET | ORAL | 0 refills | Status: DC | PRN

## 2016-02-28 MED ORDER — HYDROMORPHONE HCL 1 MG/ML IJ SOLN
1.0000 mg | Freq: Once | INTRAMUSCULAR | Status: AC
  Administered 2016-02-28: 1 mg via INTRAVENOUS
  Filled 2016-02-28: qty 1

## 2016-02-28 MED ORDER — METOCLOPRAMIDE HCL 5 MG/ML IJ SOLN
5.0000 mg | Freq: Once | INTRAMUSCULAR | Status: AC
  Administered 2016-02-28: 5 mg via INTRAVENOUS
  Filled 2016-02-28: qty 2

## 2016-02-28 MED ORDER — SODIUM CHLORIDE 0.9 % IV BOLUS (SEPSIS)
1000.0000 mL | Freq: Once | INTRAVENOUS | Status: AC
  Administered 2016-02-28: 1000 mL via INTRAVENOUS

## 2016-02-28 MED ORDER — IOPAMIDOL (ISOVUE-300) INJECTION 61%
100.0000 mL | Freq: Once | INTRAVENOUS | Status: AC | PRN
  Administered 2016-02-28: 80 mL via INTRAVENOUS

## 2016-02-28 MED ORDER — FAMOTIDINE IN NACL 20-0.9 MG/50ML-% IV SOLN
20.0000 mg | Freq: Once | INTRAVENOUS | Status: AC
  Administered 2016-02-28: 20 mg via INTRAVENOUS
  Filled 2016-02-28: qty 50

## 2016-02-28 MED ORDER — SODIUM CHLORIDE 0.9 % IV SOLN
INTRAVENOUS | Status: DC
  Administered 2016-02-28: 15:00:00 via INTRAVENOUS

## 2016-02-28 MED ORDER — DICYCLOMINE HCL 20 MG PO TABS: 20.0000 mg | ORAL_TABLET | Freq: Two times a day (BID) | ORAL | 0 refills | Status: DC

## 2016-02-28 MED ORDER — LACTATED RINGERS IV SOLN
INTRAVENOUS | Status: DC
  Administered 2016-02-28: 12:00:00 via INTRAVENOUS

## 2016-02-28 MED ORDER — LACTATED RINGERS IV BOLUS (SEPSIS)
1000.0000 mL | Freq: Once | INTRAVENOUS | Status: AC
  Administered 2016-02-28: 1000 mL via INTRAVENOUS

## 2016-02-28 MED ORDER — METOCLOPRAMIDE HCL 5 MG/ML IJ SOLN
10.0000 mg | Freq: Once | INTRAMUSCULAR | Status: AC
  Administered 2016-02-28: 10 mg via INTRAVENOUS
  Filled 2016-02-28: qty 2

## 2016-02-28 MED ORDER — IOPAMIDOL (ISOVUE-300) INJECTION 61%
30.0000 mL | Freq: Once | INTRAVENOUS | Status: AC | PRN
  Administered 2016-02-28: 30 mL via ORAL

## 2016-02-28 MED ORDER — LORAZEPAM 2 MG/ML IJ SOLN
0.5000 mg | Freq: Once | INTRAMUSCULAR | Status: AC
  Administered 2016-02-28: 0.5 mg via INTRAVENOUS
  Filled 2016-02-28: qty 1

## 2016-02-28 MED ORDER — ONDANSETRON 8 MG PO TBDP
8.0000 mg | ORAL_TABLET | Freq: Three times a day (TID) | ORAL | 0 refills | Status: DC | PRN
Start: 2016-02-28 — End: 2016-11-26

## 2016-02-28 MED ORDER — ONDANSETRON HCL 4 MG/2ML IJ SOLN
4.0000 mg | Freq: Once | INTRAMUSCULAR | Status: DC
  Filled 2016-02-28: qty 2

## 2016-02-28 NOTE — ED Provider Notes (Signed)
[facility] DEPT Provider Note   CSN: [id number] Arrival date & time: 02/28/16  [id number]     History   Chief Complaint Chief Complaint  Patient presents with  . Emesis    HPI Debbie Hughes is a 25 y.o. female.  25 year old female presents with acute onset of lower abdominal discomfort without vaginal bleeding or discharge. Denies any dysuria or hematuria. Also has had nausea and vomiting for several months which she attributed to birth control medication which was recently switched. Denies any history of IBS. Does have a history of ectopic patency 3 years ago. Her emesis is nonbilious. Denies any diarrhea. Pain is colicky and nothing makes it better or worse. Patient with on his hospital prior to arrival here and that workup was reviewed. She had a negative beta hCG. She had a CBC as well as pancreatic studies done which did not show any acute findings. She was sent here for further management.      Past Medical History:  Diagnosis Date  . Breast mass in female 01/21/06  . Breast nodule 04/26/10   Left  . BV (bacterial vaginosis) 5.10/11  . Candida vaginitis 04/26/10  . Ectopic pregnancy 03/24/2013  . Fibroadenoma   . H/O varicella   . History of chlamydia   . Irregular menstrual cycle 07/28/07  . 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 Vaginal discharge 10/01/07  . Vaginitis     Patient Active Problem List   Diagnosis Date Noted  . Labor, precipitous, delivered 04/29/2014  . GBS (group B streptococcus) infection 04/29/2014  . Allergy to penicillin 09/22/2013  . Hx of ectopic pregnancy---s/p left salpingectomy 03/2013 09/22/2013  . Hyperemesis 09/21/2013    Past Surgical History:  Procedure Laterality Date  . BREAST CYST EXCISION Left 2007  . DILATION AND CURETTAGE OF UTERUS    . LAPAROSCOPY N/A 03/24/2013   Procedure: LAPAROSCOPY OPERATIVE;  Surgeon: Betsy Coder, MD;  Location: [facility];  Service: Gynecology;  Laterality: N/A;   . Left Salpingectomy  03/24/2013   Ectopic Pregnancy  . UNILATERAL SALPINGECTOMY Left 03/24/2013   Procedure: UNILATERAL SALPINGECTOMY;  Surgeon: Betsy Coder, MD;  Location: [facility];  Service: Gynecology;  Laterality: Left;    OB History    Gravida Para Term Preterm AB Living   4 1 1   3 1    SAB TAB Ectopic Multiple Live Births   1   1 0 1       Home Medications    Prior to Admission medications   Medication Sig Start Date End Date Taking? Authorizing Provider  ibuprofen (ADVIL,MOTRIN) 600 MG tablet Take 1 tablet (600 mg total) by mouth every 6 (six) hours. Patient not taking: Reported on 02/28/2016 05/01/14   Venus Standard, CNM  oxyCODONE-acetaminophen (PERCOCET/ROXICET) 5-325 MG per tablet Take 1 tablet by mouth every 4 (four) hours as needed (for pain scale less than 7). Patient not taking: Reported on 02/28/2016 05/01/14   Venus Standard, CNM    Family History Family History  Problem Relation Age of Onset  . Asthma Maternal Grandmother   . Hypertension Maternal Grandmother   . Diabetes Maternal Grandmother   . Hypertension Paternal Grandfather   . Diabetes Paternal Grandfather     Social History Social History  Substance Use Topics  . Smoking status: Former Research scientist (life sciences)  . Smokeless tobacco: Never Used  . Alcohol use No     Allergies   Penicillins   Review of Systems Review of Systems  All other systems reviewed and are negative.    Physical Exam Updated Vital Signs BP 131/93 (BP Location: Left Arm)   Pulse 92   Temp 98 F (36.7 C) (Oral)   Resp 20   Ht 5\' 3"  (1.6 m)   Wt 49.6 kg   LMP 01/27/2016 (Exact Date)   SpO2 98%   BMI 19.38 kg/m   Physical Exam  Constitutional: She is oriented to person, place, and time. She appears well-developed and well-nourished.  Non-toxic appearance. No distress.  HENT:  Head: Normocephalic and atraumatic.  Eyes: Conjunctivae, EOM and lids are normal. Pupils are equal, round, and reactive to light.  Neck:  Normal range of motion. Neck supple. No tracheal deviation present. No thyroid mass present.  Cardiovascular: Normal rate, regular rhythm and normal heart sounds.  Exam reveals no gallop.   No murmur heard. Pulmonary/Chest: Effort normal and breath sounds normal. No stridor. No respiratory distress. She has no decreased breath sounds. She has no wheezes. She has no rhonchi. She has no rales.  Abdominal: Soft. Normal appearance and bowel sounds are normal. She exhibits no distension. There is tenderness in the suprapubic area. There is no rigidity, no rebound, no guarding and no CVA tenderness.  Musculoskeletal: Normal range of motion. She exhibits no edema or tenderness.  Neurological: She is alert and oriented to person, place, and time. She has normal strength. No cranial nerve deficit or sensory deficit. GCS eye subscore is 4. GCS verbal subscore is 5. GCS motor subscore is 6.  Skin: Skin is warm and dry. No abrasion and no rash noted.  Psychiatric: Her speech is normal and behavior is normal. Her mood appears anxious.  Nursing note and vitals reviewed.    ED Treatments / Results  Labs (all labs ordered are listed, but only abnormal results are displayed) Labs Reviewed  URINALYSIS, ROUTINE W REFLEX MICROSCOPIC (NOT AT [facility]) - Abnormal; Notable for the following:       Result Value   pH 8.5 (*)    Ketones, ur 15 (*)    All other components within normal limits  CBC WITH DIFFERENTIAL/PLATELET - Abnormal; Notable for the following:    Neutro Abs 8.3 (*)    All other components within normal limits  HCG, QUANTITATIVE, PREGNANCY  AMYLASE  LIPASE, BLOOD  HIV ANTIBODY (ROUTINE TESTING)  COMPREHENSIVE METABOLIC PANEL  POCT PREGNANCY, URINE    EKG  EKG Interpretation None       Radiology No results found.  Procedures Procedures (including critical care time)  Medications Ordered in ED Medications  lactated ringers infusion ( Intravenous New Bag/Given 02/28/16 1224)  sodium  chloride 0.9 % bolus 1,000 mL (not administered)  0.9 %  sodium chloride infusion (not administered)  HYDROmorphone (DILAUDID) injection 1 mg (not administered)  LORazepam (ATIVAN) injection 0.5 mg (not administered)  ondansetron (ZOFRAN) injection 4 mg (not administered)  lactated ringers bolus 1,000 mL (1,000 mLs Intravenous Given 02/28/16 1055)  metoCLOPramide (REGLAN) injection 10 mg (10 mg Intravenous Given 02/28/16 1056)  famotidine (PEPCID) IVPB 20 mg premix (20 mg Intravenous Given 02/28/16 1055)  ondansetron (ZOFRAN) 8 mg in sodium chloride 0.9 % 50 mL IVPB (8 mg Intravenous Given 02/28/16 1244)     Initial Impression / Assessment and Plan / ED Course  I have reviewed the triage vital signs and the nursing notes.  Pertinent labs & imaging results that were available during my care of the patient were reviewed by me and considered in my medical decision making (  see chart for details).  Clinical Course    Patient's abdominal CT scan without acute findings. Medicated for pain and feels better. Will give referral to GI on call  Final Clinical Impressions(s) / ED Diagnoses   Final diagnoses:  Upper abdominal pain  Nausea and vomiting in adult    New Prescriptions New Prescriptions   No medications on file     Lacretia Leigh, MD 02/28/16 1734

## 2016-02-28 NOTE — MAU Note (Signed)
Patient presents with vomiting for past several days. Patient had +HPT on Sunday.  States LMP was 01/27/16.  No other complaints at this time.

## 2016-02-28 NOTE — ED Notes (Signed)
MD at bedside. 

## 2016-02-28 NOTE — MAU Provider Note (Signed)
Patient Debbie Hughes is a 25 year old G4 P1031 here with complaint of extreme nausea and vomiting since this  Morning. She started taking the patch in May 2017 (she's not exactly sure when she started the patch but husband thinks it was in May). She has had nausea and vomiting since off and on with her period when on the patch. She stopped taking the patch on September 23 and has continued to have some nausea and vomiting since then. She took a pregnancy test on Oct 22 and it was negative intially but then noticed it was faintly positive in the trash can the next day. Her vomiting this morning started at 5:30 am.   Her diet recall from Tuesday, October 24 was m[employer] for dinner, cookie for dessert. During the day she had a 16 oz soda, glass of milk, hotdog and broccoli. Patient had a bowel movement this morning.  She states that she has also been losing weight.   History     CSN: [id number]  Arrival date and time: 02/28/16 [id number]   First Provider Initiated Contact with Patient 02/28/16 1005      Chief Complaint  Patient presents with  . Emesis   HPI  OB History    Gravida Para Term Preterm AB Living   4 1 1   3 1    SAB TAB Ectopic Multiple Live Births   1   1 0 1      Past Medical History:  Diagnosis Date  . Breast mass in female 01/21/06  . Breast nodule 04/26/10   Left  . BV (bacterial vaginosis) 5.10/11  . Candida vaginitis 04/26/10  . Ectopic pregnancy 03/24/2013  . Fibroadenoma   . H/O varicella   . History of chlamydia   . Irregular menstrual cycle 07/28/07  . 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 Vaginal discharge 10/01/07  . Vaginitis     Past Surgical History:  Procedure Laterality Date  . BREAST CYST EXCISION Left 2007  . DILATION AND CURETTAGE OF UTERUS    . LAPAROSCOPY N/A 03/24/2013   Procedure: LAPAROSCOPY OPERATIVE;  Surgeon: Betsy Coder, MD;  Location: [facility];  Service: Gynecology;  Laterality: N/A;  . Left  Salpingectomy  03/24/2013   Ectopic Pregnancy  . UNILATERAL SALPINGECTOMY Left 03/24/2013   Procedure: UNILATERAL SALPINGECTOMY;  Surgeon: Betsy Coder, MD;  Location: [facility];  Service: Gynecology;  Laterality: Left;    Family History  Problem Relation Age of Onset  . Asthma Maternal Grandmother   . Hypertension Maternal Grandmother   . Diabetes Maternal Grandmother   . Hypertension Paternal Grandfather   . Diabetes Paternal Grandfather     Social History  Substance Use Topics  . Smoking status: Former Research scientist (life sciences)  . Smokeless tobacco: Never Used  . Alcohol use No    Allergies:  Allergies  Allergen Reactions  . Penicillins Rash    Has patient had a PCN reaction causing immediate rash, facial/tongue/throat swelling, SOB or lightheadedness with hypotension: yes Has patient had a PCN reaction causing severe rash involving mucus membranes or skin necrosis: no Has patient had a PCN reaction that required hospitalization no Has patient had a PCN reaction occurring within the last 10 years: yes If all of the above answers are "NO", then may proceed with Cephalosporin use.     Prescriptions Prior to Admission  Medication Sig Dispense Refill Last Dose  . ibuprofen (ADVIL,MOTRIN) 600 MG tablet Take 1 tablet (600 mg total)  by mouth every 6 (six) hours. (Patient not taking: Reported on 02/28/2016) 30 tablet 0 Not Taking at Unknown time  . oxyCODONE-acetaminophen (PERCOCET/ROXICET) 5-325 MG per tablet Take 1 tablet by mouth every 4 (four) hours as needed (for pain scale less than 7). (Patient not taking: Reported on 02/28/2016) 30 tablet 0 Not Taking at Unknown time    Review of Systems  HENT: Negative.   Eyes: Negative.   Respiratory: Negative.   Cardiovascular: Negative.   Gastrointestinal: Positive for abdominal pain, nausea and vomiting. Negative for constipation and diarrhea.  Genitourinary: Negative.   Musculoskeletal: Negative.   Skin: Negative.   Neurological: Positive for  weakness.  Endo/Heme/Allergies: Negative.   Psychiatric/Behavioral: Negative.    Physical Exam   Blood pressure 112/81, pulse 80, temperature 97.7 F (36.5 C), temperature source Oral, resp. rate 18, height 5\' 3"  (1.6 m), weight 109 lb 6.4 oz (49.6 kg), last menstrual period 01/27/2016, SpO2 100 %, unknown if currently breastfeeding.  Physical Exam  Constitutional: She is oriented to person, place, and time. She appears well-developed.  HENT:  Head: Normocephalic.  Neck: Normal range of motion.  GI: Soft.  Hypotonic bowel sounds  Neurological: She is alert and oriented to person, place, and time.  Skin: Skin is warm and dry.    MAU Course  Procedures  MDM 1. Beta HcG to rule out pregnancy 2. IV hydration  3. CBC for white count 4. Reglan for vomiting Pending results of bHCG, consider PUD, gallbladder, pancreatitis.   Assessment and Plan  bHCG shows patient is not pregnant and CBC shows no grossly elevated white count. She feels better after IV hydration and reglan.  Patient to be transferred to [facility] ED for further evaluation. Will draw amylase, lipase and HIV (given weight loss) prior to transfer.   Mervyn Skeeters Herson Prichard CNM 02/28/2016, 10:10 AM

## 2016-02-28 NOTE — ED Notes (Addendum)
Patient coming from [facility]. Patient has had abdominal pain, nausea, vomiting, and diarrhea for 3-4 months. Pain is more severe today.

## 2016-02-28 NOTE — Progress Notes (Signed)
Patient listed a shaving [employer] insurance without a pcp.  EDCM went to speak to patient at bedside, however, patient at CT scan per family at bedside.  Patient's mother reports patient's pcp is located at [facility] on [address].  System updated.

## 2016-02-28 NOTE — ED Notes (Signed)
Discharge instructions, follow up care, and rx x3 reviewed with patient. Patient verbalized understanding. 

## 2016-02-28 NOTE — MAU Note (Signed)
Patient states she has been having episodes of vomiting in the morning for past few months, but worse today.  Thinks it all started when she went off the birth control patch.

## 2016-02-29 LAB — HIV ANTIBODY (ROUTINE TESTING W REFLEX): HIV Screen 4th Generation wRfx: NONREACTIVE

## 2016-05-29 ENCOUNTER — Encounter (HOSPITAL_COMMUNITY): Payer: Self-pay | Admitting: Emergency Medicine

## 2016-05-29 ENCOUNTER — Emergency Department (HOSPITAL_COMMUNITY)
Admission: EM | Admit: 2016-05-29 | Discharge: 2016-05-29 | Disposition: A | Discharge status: Home / Self Care | Payer: BC Managed Care – PPO | Attending: Emergency Medicine | Admitting: Emergency Medicine

## 2016-05-29 ENCOUNTER — Emergency Department (HOSPITAL_COMMUNITY): Payer: BC Managed Care – PPO

## 2016-05-29 DIAGNOSIS — S20212A Contusion of left front wall of thorax, initial encounter: Secondary | ICD-10-CM | POA: Insufficient documentation

## 2016-05-29 DIAGNOSIS — Y939 Activity, unspecified: Secondary | ICD-10-CM | POA: Diagnosis not present

## 2016-05-29 DIAGNOSIS — Y999 Unspecified external cause status: Secondary | ICD-10-CM | POA: Insufficient documentation

## 2016-05-29 DIAGNOSIS — S161XXA Strain of muscle, fascia and tendon at neck level, initial encounter: Secondary | ICD-10-CM | POA: Insufficient documentation

## 2016-05-29 DIAGNOSIS — Z87891 Personal history of nicotine dependence: Secondary | ICD-10-CM | POA: Diagnosis not present

## 2016-05-29 DIAGNOSIS — Y92410 Unspecified street and highway as the place of occurrence of the external cause: Secondary | ICD-10-CM | POA: Diagnosis not present

## 2016-05-29 DIAGNOSIS — S199XXA Unspecified injury of neck, initial encounter: Secondary | ICD-10-CM | POA: Diagnosis present

## 2016-05-29 MED ORDER — TRAMADOL HCL 50 MG PO TABS
50.0000 mg | ORAL_TABLET | Freq: Four times a day (QID) | ORAL | 0 refills | Status: DC | PRN
Start: 2016-05-29 — End: 2016-11-22

## 2016-05-29 MED ORDER — IBUPROFEN 800 MG PO TABS: 800.0000 mg | ORAL_TABLET | Freq: Three times a day (TID) | ORAL | 0 refills | Status: DC | PRN

## 2016-05-29 NOTE — ED Notes (Signed)
Pt ambulated to triage from waiting room without assistance or difficulty.
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
## 2016-05-29 NOTE — Discharge Instructions (Signed)
Return here as needed.  Your x-rays did not show any abnormalities.  Use ice and heat on the areas that are sore.  You can expect be more sore tomorrow and over the next 7-14 days.  Follow-up with a primary care doctor
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
## 2016-11-22 ENCOUNTER — Inpatient Hospital Stay (HOSPITAL_COMMUNITY): Payer: BC Managed Care – PPO

## 2016-11-22 ENCOUNTER — Encounter (HOSPITAL_COMMUNITY): Payer: Self-pay

## 2016-11-22 ENCOUNTER — Inpatient Hospital Stay (HOSPITAL_COMMUNITY)
Admission: AD | Admit: 2016-11-22 | Discharge: 2016-11-26 | DRG: 781 | Disposition: A | Discharge status: Home / Self Care | Payer: BC Managed Care – PPO | Source: Ambulatory Visit | Attending: Obstetrics and Gynecology | Admitting: Obstetrics and Gynecology

## 2016-11-22 DIAGNOSIS — N6002 Solitary cyst of left breast: Secondary | ICD-10-CM | POA: Diagnosis not present

## 2016-11-22 DIAGNOSIS — O26891 Other specified pregnancy related conditions, first trimester: Secondary | ICD-10-CM | POA: Diagnosis present

## 2016-11-22 DIAGNOSIS — O26899 Other specified pregnancy related conditions, unspecified trimester: Secondary | ICD-10-CM

## 2016-11-22 DIAGNOSIS — O210 Mild hyperemesis gravidarum: Secondary | ICD-10-CM | POA: Diagnosis present

## 2016-11-22 DIAGNOSIS — R739 Hyperglycemia, unspecified: Secondary | ICD-10-CM | POA: Diagnosis present

## 2016-11-22 DIAGNOSIS — R1084 Generalized abdominal pain: Secondary | ICD-10-CM | POA: Diagnosis present

## 2016-11-22 DIAGNOSIS — Z880 Allergy status to penicillin: Secondary | ICD-10-CM

## 2016-11-22 DIAGNOSIS — Z87891 Personal history of nicotine dependence: Secondary | ICD-10-CM

## 2016-11-22 DIAGNOSIS — Z3A01 Less than 8 weeks gestation of pregnancy: Secondary | ICD-10-CM

## 2016-11-22 DIAGNOSIS — R109 Unspecified abdominal pain: Secondary | ICD-10-CM | POA: Diagnosis present

## 2016-11-22 LAB — GLUCOSE, CAPILLARY
GLUCOSE-CAPILLARY: 300 mg/dL — AB (ref 65–99)
GLUCOSE-CAPILLARY: 132 mg/dL — AB (ref 65–99)
Glucose-Capillary: 108 mg/dL — ABNORMAL HIGH (ref 65–99)

## 2016-11-22 LAB — CBC WITH DIFFERENTIAL/PLATELET
BASOS ABS: 0 10*3/uL (ref 0.0–0.1)
Basophils Relative: 0 %
EOS PCT: 0 %
Eosinophils Absolute: 0 10*3/uL (ref 0.0–0.7)
HCT: 36.2 % (ref 36.0–46.0)
Hemoglobin: 13 g/dL (ref 12.0–15.0)
LYMPHS PCT: 8 %
Lymphs Abs: 0.9 10*3/uL (ref 0.7–4.0)
MCH: 32.4 pg (ref 26.0–34.0)
MCHC: 35.9 g/dL (ref 30.0–36.0)
MCV: 90.3 fL (ref 78.0–100.0)
MONO ABS: 0.2 10*3/uL (ref 0.1–1.0)
Monocytes Relative: 2 %
Neutro Abs: 9.9 10*3/uL — ABNORMAL HIGH (ref 1.7–7.7)
Neutrophils Relative %: 90 %
PLATELETS: 211 10*3/uL (ref 150–400)
RBC: 4.01 MIL/uL (ref 3.87–5.11)
RDW: 11.5 % (ref 11.5–15.5)
WBC: 11 10*3/uL — ABNORMAL HIGH (ref 4.0–10.5)

## 2016-11-22 LAB — URINALYSIS, ROUTINE W REFLEX MICROSCOPIC
Glucose, UA: NEGATIVE mg/dL
Hgb urine dipstick: NEGATIVE
Ketones, ur: 80 mg/dL — AB
Nitrite: NEGATIVE
Protein, ur: 100 mg/dL — AB
Specific Gravity, Urine: 1.031 — ABNORMAL HIGH (ref 1.005–1.030)
pH: 7 (ref 5.0–8.0)

## 2016-11-22 LAB — COMPREHENSIVE METABOLIC PANEL
ALT: 15 U/L (ref 14–54)
AST: 26 U/L (ref 15–41)
Albumin: 4.2 g/dL (ref 3.5–5.0)
Alkaline Phosphatase: 41 U/L (ref 38–126)
Anion gap: 11 (ref 5–15)
BUN: 13 mg/dL (ref 6–20)
CO2: 18 mmol/L — ABNORMAL LOW (ref 22–32)
Calcium: 9.2 mg/dL (ref 8.9–10.3)
Chloride: 104 mmol/L (ref 101–111)
Creatinine, Ser: 0.87 mg/dL (ref 0.44–1.00)
GFR calc Af Amer: >60 mL/min (ref >60)
GFR calc non Af Amer: >60 mL/min (ref >60)
Glucose, Bld: 266 mg/dL — ABNORMAL HIGH (ref 65–99)
Potassium: 3.7 mmol/L (ref 3.5–5.1)
Sodium: 133 mmol/L — ABNORMAL LOW (ref 135–145)
Total Bilirubin: 0.9 mg/dL (ref 0.3–1.2)
Total Protein: 6.8 g/dL (ref 6.5–8.1)

## 2016-11-22 LAB — TSH: TSH: 0.737 u[IU]/mL (ref 0.350–4.500)

## 2016-11-22 LAB — CBC
HCT: 36.8 % (ref 36.0–46.0)
Hemoglobin: 13 g/dL (ref 12.0–15.0)
MCH: 32.4 pg (ref 26.0–34.0)
MCHC: 35.3 g/dL (ref 30.0–36.0)
MCV: 91.8 fL (ref 78.0–100.0)
Platelets: 197 10*3/uL (ref 150–400)
RBC: 4.01 MIL/uL (ref 3.87–5.11)
RDW: 11.6 % (ref 11.5–15.5)
WBC: 7.8 10*3/uL (ref 4.0–10.5)

## 2016-11-22 LAB — HCG, QUANTITATIVE, PREGNANCY: hCG, Beta Chain, Quant, S: 577 m[IU]/mL — ABNORMAL HIGH (ref <5)

## 2016-11-22 LAB — POCT PREGNANCY, URINE: Preg Test, Ur: POSITIVE — AB

## 2016-11-22 MED ORDER — LACTATED RINGERS IV SOLN
INTRAVENOUS | Status: DC
  Administered 2016-11-22 – 2016-11-23 (×2): via INTRAVENOUS

## 2016-11-22 MED ORDER — ACETAMINOPHEN 500 MG PO TABS
1000.0000 mg | ORAL_TABLET | Freq: Once | ORAL | Status: AC
  Administered 2016-11-22: 1000 mg via ORAL
  Filled 2016-11-22: qty 2

## 2016-11-22 MED ORDER — INSULIN ASPART 100 UNIT/ML ~~LOC~~ SOLN: 0.0000 [IU] | SUBCUTANEOUS | Status: DC

## 2016-11-22 MED ORDER — DEXTROSE 5 % IN LACTATED RINGERS IV BOLUS
500.0000 mL | Freq: Once | INTRAVENOUS | Status: AC
  Administered 2016-11-22: 500 mL via INTRAVENOUS

## 2016-11-22 MED ORDER — DOCUSATE SODIUM 100 MG PO CAPS
100.0000 mg | ORAL_CAPSULE | Freq: Every day | ORAL | Status: DC
Start: 2016-11-23 — End: 2016-11-26
  Administered 2016-11-25 – 2016-11-26 (×2): 100 mg via ORAL
  Filled 2016-11-22 (×2): qty 1

## 2016-11-22 MED ORDER — ACETAMINOPHEN 325 MG PO TABS: 650.0000 mg | ORAL_TABLET | Freq: Four times a day (QID) | ORAL | Status: DC | PRN

## 2016-11-22 MED ORDER — DEXTROSE IN LACTATED RINGERS 5 % IV SOLN
INTRAVENOUS | Status: DC
  Administered 2016-11-22: 17:00:00 via INTRAVENOUS

## 2016-11-22 MED ORDER — ONDANSETRON HCL 4 MG/2ML IJ SOLN
4.0000 mg | Freq: Once | INTRAMUSCULAR | Status: AC
  Administered 2016-11-22: 4 mg via INTRAVENOUS
  Filled 2016-11-22: qty 2

## 2016-11-22 MED ORDER — GLYCOPYRROLATE 0.2 MG/ML IJ SOLN
0.1000 mg | Freq: Four times a day (QID) | INTRAMUSCULAR | Status: DC | PRN
  Filled 2016-11-22: qty 0.5

## 2016-11-22 MED ORDER — ACETAMINOPHEN 10 MG/ML IV SOLN: 650.0000 mg | Freq: Four times a day (QID) | INTRAVENOUS | Status: DC | PRN

## 2016-11-22 MED ORDER — DIPHENHYDRAMINE HCL 50 MG/ML IJ SOLN
25.0000 mg | Freq: Once | INTRAMUSCULAR | Status: AC
  Administered 2016-11-22: 25 mg via INTRAVENOUS
  Filled 2016-11-22: qty 1

## 2016-11-22 MED ORDER — CALCIUM CARBONATE ANTACID 500 MG PO CHEW: 2.0000 | CHEWABLE_TABLET | ORAL | Status: DC | PRN

## 2016-11-22 MED ORDER — ZOLPIDEM TARTRATE 5 MG PO TABS: 5.0000 mg | ORAL_TABLET | Freq: Every evening | ORAL | Status: DC | PRN

## 2016-11-22 MED ORDER — PROMETHAZINE HCL 25 MG/ML IJ SOLN
12.5000 mg | Freq: Four times a day (QID) | INTRAMUSCULAR | Status: DC
  Administered 2016-11-22 – 2016-11-25 (×10): 12.5 mg via INTRAVENOUS
  Filled 2016-11-22 (×10): qty 1

## 2016-11-22 MED ORDER — PROMETHAZINE HCL 25 MG/ML IJ SOLN
25.0000 mg | Freq: Once | INTRAMUSCULAR | Status: AC
  Administered 2016-11-22: 25 mg via INTRAMUSCULAR
  Filled 2016-11-22: qty 1

## 2016-11-22 MED ORDER — PRENATAL MULTIVITAMIN CH
1.0000 | ORAL_TABLET | Freq: Every day | ORAL | Status: DC
  Administered 2016-11-25: 1 via ORAL
  Filled 2016-11-22: qty 1

## 2016-11-22 MED ORDER — PYRIDOXINE HCL 100 MG/ML IJ SOLN
100.0000 mg | Freq: Every day | INTRAMUSCULAR | Status: DC
  Administered 2016-11-22: 100 mg via INTRAVENOUS
  Filled 2016-11-22 (×2): qty 1

## 2016-11-22 MED ORDER — HYDROMORPHONE HCL 1 MG/ML IJ SOLN
1.0000 mg | INTRAMUSCULAR | Status: DC | PRN
  Administered 2016-11-22 – 2016-11-24 (×5): 1 mg via INTRAVENOUS
  Filled 2016-11-22 (×5): qty 1

## 2016-11-22 MED ORDER — METOCLOPRAMIDE HCL 5 MG/ML IJ SOLN
10.0000 mg | Freq: Once | INTRAMUSCULAR | Status: AC
  Administered 2016-11-22: 10 mg via INTRAVENOUS
  Filled 2016-11-22: qty 2

## 2016-11-22 NOTE — Progress Notes (Signed)
Pt requesting pain meds for abdominal cramping d/t persistent retching

## 2016-11-22 NOTE — MAU Provider Note (Signed)
Debbie Hughes 26 y.o. [id number] @ [redacted]w[redacted]d History     CSN: [id number]  Arrival date & time 11/22/16  1422   None     Chief Complaint  Patient presents with  . Nausea  . Emesis    Pt presents stating that she has been throwing up and retching since 800am this morning. She does have a history of ectopic pregnancy and hyperemesis. She required a medrol dose pack in the past to get her n/v under control. She denies a history of diabetes. Denies vaginal bleeding. States she was seen in the office this week for an annual exam and was unaware that she was pregnant until today. She is complaining of stomach pain but thinks it is related to heaving so much. States she took a ODT Zofran this am after she became sick.    Past Medical History:  Diagnosis Date  . Breast mass in female 01/21/06  . Breast nodule 04/26/10   Left  . BV (bacterial vaginosis) 5.10/11  . Candida vaginitis 04/26/10  . Ectopic pregnancy 03/24/2013  . Fibroadenoma   . H/O varicella   . History of chlamydia   . Irregular menstrual cycle 07/28/07  . 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 Vaginal discharge 10/01/07  . Vaginitis     Past Surgical History:  Procedure Laterality Date  . BREAST CYST EXCISION Left 2007  . DILATION AND CURETTAGE OF UTERUS    . LAPAROSCOPY N/A 03/24/2013   Procedure: LAPAROSCOPY OPERATIVE;  Surgeon: Betsy Coder, MD;  Location: [facility];  Service: Gynecology;  Laterality: N/A;  . Left Salpingectomy  03/24/2013   Ectopic Pregnancy  . UNILATERAL SALPINGECTOMY Left 03/24/2013   Procedure: UNILATERAL SALPINGECTOMY;  Surgeon: Betsy Coder, MD;  Location: [facility];  Service: Gynecology;  Laterality: Left;    Family History  Problem Relation Age of Onset  . Asthma Maternal Grandmother   . Hypertension Maternal Grandmother   . Diabetes Maternal Grandmother   . Hypertension Paternal Grandfather   . Diabetes Paternal Grandfather     Social History   Substance Use Topics  . Smoking status: Former Research scientist (life sciences)  . Smokeless tobacco: Never Used  . Alcohol use No    OB History    Gravida Para Term Preterm AB Living   5 1 1   3 1    SAB TAB Ectopic Multiple Live Births   1   1 0 1      Review of Systems  Constitutional: Positive for appetite change.  Gastrointestinal: Positive for abdominal pain, nausea and vomiting.  Neurological: Positive for dizziness.    Allergies  Penicillins  Home Medications    BP 124/76 (BP Location: Right Arm)   Pulse (!) 105   Temp 98.5 F (36.9 C) (Oral)   Resp 18   Ht 5\' 3"  (1.6 m)   Wt 121 lb (54.9 kg)   LMP 10/20/2016   BMI 21.43 kg/m   Physical Exam  Constitutional: She is oriented to person, place, and time. She appears well-developed and well-nourished. She appears distressed.  Retching and constantly moving on the bed  HENT:  Head: Normocephalic and atraumatic.  Neck: Normal range of motion.  Cardiovascular: Normal rate and regular rhythm.   Pulmonary/Chest: Effort normal and breath sounds normal. No respiratory distress.  Abdominal: Soft. There is no tenderness.  Musculoskeletal: Normal range of motion.  Neurological: She is alert and oriented to person, place, and time.  Skin: Skin is warm and  dry.  Psychiatric: She has a normal mood and affect.  Nursing note and vitals reviewed.   MAU Course  Procedures (including critical care time)  Labs Reviewed  URINALYSIS, ROUTINE W REFLEX MICROSCOPIC - Abnormal; Notable for the following:       Result Value   APPearance HAZY (*)    Specific Gravity, Urine 1.031 (*)    Bilirubin Urine SMALL (*)    Ketones, ur 80 (*)    Protein, ur 100 (*)    Leukocytes, UA TRACE (*)    Bacteria, UA RARE (*)    Squamous Epithelial / LPF 0-5 (*)    All other components within normal limits  COMPREHENSIVE METABOLIC PANEL - Abnormal; Notable for the following:    Sodium 133 (*)    CO2 18 (*)    Glucose, Bld 266 (*)    All other components within  normal limits  GLUCOSE, CAPILLARY - Abnormal; Notable for the following:    Glucose-Capillary 300 (*)    All other components within normal limits  POCT PREGNANCY, URINE - Abnormal; Notable for the following:    Preg Test, Ur POSITIVE (*)    All other components within normal limits  CBC   No results found.   No diagnosis found.    MDM   Discussed pt with Dr Mancel Bale. Change in IVF to LR and added labs Hgb A1C, Quant HCG, TSH related to elevated BG of 266. Will add REglan, Vitamin B6 and Benadryl to medication regimen. Recheck BG in 2 hours and if Reglan does not give relief start on Diclegis. Also, ultrasound to r/o ectopic, molar pregnancy and dating. Pt will remain in MAU and not be admitted at this time.    Cecille Rubin Clemmons CNM 11/22/16 @ 604pm

## 2016-11-22 NOTE — MAU Note (Signed)
Patient presents with N/V x 2 days, positive UPT, LMP 6/17

## 2016-11-22 NOTE — H&P (Signed)
Raynee Mccasland Pressnell is a 26 y.o. female w/ last known menstrual period of 10/21/16. Presents w/ increased N/V for the past 24 hrs, and vague "internal pain" from her epigastric region to the bottom of her stomach. Lying in bed thrashing around in pain with periodic retching. Pt did not throw up until after receiving 1 gram of Tylenol.  History significant for ectopic in 2014, and hyperemesis in her last pregnancy that required steroid taper.  Pertinent Gynecological History: Menses: 10/21/16 Bleeding: None Contraception: none DES exposure: unknown Blood transfusions: none Sexually transmitted diseases: past history: Chlamydia Previous GYN Procedures: Laparoscopy - left salpingectomy for ectopic 03/24/13, breast cyst excision in Jan 2007  Last mammogram: NA Last pap: normal Date: 09/13/13 OB History: G4, P1021   Menstrual History: Menarche age: 26 Patient's last menstrual period was 10/20/2016.    Past Medical History:  Diagnosis Date  . Breast mass in female 01/21/06  . Breast nodule 04/26/10   Left  . BV (bacterial vaginosis) 5.10/11  . Candida vaginitis 04/26/10  . Ectopic pregnancy 03/24/2013  . Fibroadenoma   . H/O varicella   . History of chlamydia   . Irregular menstrual cycle 07/28/07  . 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 Vaginal discharge 10/01/07  . Vaginitis     Past Surgical History:  Procedure Laterality Date  . BREAST CYST EXCISION Left 2007  . DILATION AND CURETTAGE OF UTERUS    . LAPAROSCOPY N/A 03/24/2013   Procedure: LAPAROSCOPY OPERATIVE;  Surgeon: Betsy Coder, MD;  Location: [facility];  Service: Gynecology;  Laterality: N/A;  . Left Salpingectomy  03/24/2013   Ectopic Pregnancy  . UNILATERAL SALPINGECTOMY Left 03/24/2013   Procedure: UNILATERAL SALPINGECTOMY;  Surgeon: Betsy Coder, MD;  Location: [facility];  Service: Gynecology;  Laterality: Left;    Family History  Problem Relation Age of Onset  . Asthma Maternal  Grandmother   . Hypertension Maternal Grandmother   . Diabetes Maternal Grandmother   . Hypertension Paternal Grandfather   . Diabetes Paternal Grandfather     Social History:  reports that she has quit smoking. She has never used smokeless tobacco. She reports that she does not drink alcohol or use drugs.  Allergies:  Allergies  Allergen Reactions  . Penicillins Rash    Has patient had a PCN reaction causing immediate rash, facial/tongue/throat swelling, SOB or lightheadedness with hypotension: yes Has patient had a PCN reaction causing severe rash involving mucus membranes or skin necrosis: no Has patient had a PCN reaction that required hospitalization no Has patient had a PCN reaction occurring within the last 10 years: yes If all of the above answers are "NO", then may proceed with Cephalosporin use.     Prescriptions Prior to Admission  Medication Sig Dispense Refill Last Dose  . norelgestromin-ethinyl estradiol Marilu Favre) 150-35 MCG/24HR transdermal patch Place 1 patch onto the skin once a week. Pt Uses Xulane patch. Was placed 10/20/16 for 3 weeks and then off for a week for her period.   10/20/2016  . ondansetron (ZOFRAN ODT) 8 MG disintegrating tablet Take 1 tablet (8 mg total) by mouth every 8 (eight) hours as needed for nausea or vomiting. 20 tablet 0 11/22/2016 at Unknown time    ROS 10 Systems reviewed and are negative for acute change except as noted in the HPI.  Blood pressure 124/76, pulse (!) 105, temperature 98.5 F (36.9 C), temperature source Oral, resp. rate 18, height 5\' 3"  (1.6 m), weight 54.9  kg (121 lb), last menstrual period 10/20/2016, unknown if currently breastfeeding.   Physical Exam   Nursing noteand vitalsreviewed. Neurological: She has normal reflexes.  Constitutional: She is oriented to person, place, and time. She appears well-developed and well-nourished.  HENT:  Head: Normocephalic and atraumatic.  Neck: Normal range of motion.   Cardiovascular: Normal rate, regular rhythm and normal heart sounds.  Respiratory: Effort normal and breath sounds normal.  GI: Soft. Bowel sounds are normal. Abdomen is gravid. No rebound tenderness, but there is guarding. No pain illicited by palpation. Skin: Warm and dry.  Musculoskeletal: No edema. Psychiatric: She has a normal mood and affect.   Results for orders placed or performed during the hospital encounter of 11/22/16 (from the past 24 hour(s))  Urinalysis, Routine w reflex microscopic     Status: Abnormal   Collection Time: 11/22/16  2:27 PM  Result Value Ref Range   Color, Urine YELLOW YELLOW   APPearance HAZY (A) CLEAR   Specific Gravity, Urine 1.031 (H) 1.005 - 1.030   pH 7.0 5.0 - 8.0   Glucose, UA NEGATIVE NEGATIVE mg/dL   Hgb urine dipstick NEGATIVE NEGATIVE   Bilirubin Urine SMALL (A) NEGATIVE   Ketones, ur 80 (A) NEGATIVE mg/dL   Protein, ur 100 (A) NEGATIVE mg/dL   Nitrite NEGATIVE NEGATIVE   Leukocytes, UA TRACE (A) NEGATIVE   RBC / HPF 0-5 0 - 5 RBC/hpf   WBC, UA 0-5 0 - 5 WBC/hpf   Bacteria, UA RARE (A) NONE SEEN   Squamous Epithelial / LPF 0-5 (A) NONE SEEN   Mucous PRESENT   Pregnancy, urine POC     Status: Abnormal   Collection Time: 11/22/16  2:57 PM  Result Value Ref Range   Preg Test, Ur POSITIVE (A) NEGATIVE  CBC     Status: None   Collection Time: 11/22/16  4:18 PM  Result Value Ref Range   WBC 7.8 4.0 - 10.5 K/uL   RBC 4.01 3.87 - 5.11 MIL/uL   Hemoglobin 13.0 12.0 - 15.0 g/dL   HCT 36.8 36.0 - 46.0 %   MCV 91.8 78.0 - 100.0 fL   MCH 32.4 26.0 - 34.0 pg   MCHC 35.3 30.0 - 36.0 g/dL   RDW 11.6 11.5 - 15.5 %   Platelets 197 150 - 400 K/uL  Comprehensive metabolic panel     Status: Abnormal   Collection Time: 11/22/16  4:18 PM  Result Value Ref Range   Sodium 133 (L) 135 - 145 mmol/L   Potassium 3.7 3.5 - 5.1 mmol/L   Chloride 104 101 - 111 mmol/L   CO2 18 (L) 22 - 32 mmol/L   Glucose, Bld 266 (H) 65 - 99 mg/dL   BUN 13 6 - 20 mg/dL    Creatinine, Ser 0.87 0.44 - 1.00 mg/dL   Calcium 9.2 8.9 - 10.3 mg/dL   Total Protein 6.8 6.5 - 8.1 g/dL   Albumin 4.2 3.5 - 5.0 g/dL   AST 26 15 - 41 U/L   ALT 15 14 - 54 U/L   Alkaline Phosphatase 41 38 - 126 U/L   Total Bilirubin 0.9 0.3 - 1.2 mg/dL   GFR calc non Af Amer >60 >60 mL/min   GFR calc Af Amer >60 >60 mL/min   Anion gap 11 5 - 15  Glucose, capillary     Status: Abnormal   Collection Time: 11/22/16  5:40 PM  Result Value Ref Range   Glucose-Capillary 300 (H) 65 - 99 mg/dL  TSH     Status: None   Collection Time: 11/22/16  6:08 PM  Result Value Ref Range   TSH 0.737 0.350 - 4.500 uIU/mL  hCG, quantitative, pregnancy     Status: Abnormal   Collection Time: 11/22/16  6:08 PM  Result Value Ref Range   hCG, Beta Chain, Quant, S 577 (H) <5 mIU/mL  Glucose, capillary     Status: Abnormal   Collection Time: 11/22/16  8:01 PM  Result Value Ref Range   Glucose-Capillary 132 (H) 65 - 99 mg/dL  CBC with Differential/Platelet     Status: Abnormal   Collection Time: 11/22/16  9:27 PM  Result Value Ref Range   WBC 11.0 (H) 4.0 - 10.5 K/uL   RBC 4.01 3.87 - 5.11 MIL/uL   Hemoglobin 13.0 12.0 - 15.0 g/dL   HCT 36.2 36.0 - 46.0 %   MCV 90.3 78.0 - 100.0 fL   MCH 32.4 26.0 - 34.0 pg   MCHC 35.9 30.0 - 36.0 g/dL   RDW 11.5 11.5 - 15.5 %   Platelets 211 150 - 400 K/uL   Neutrophils Relative % 90 %   Neutro Abs 9.9 (H) 1.7 - 7.7 K/uL   Lymphocytes Relative 8 %   Lymphs Abs 0.9 0.7 - 4.0 K/uL   Monocytes Relative 2 %   Monocytes Absolute 0.2 0.1 - 1.0 K/uL   Eosinophils Relative 0 %   Eosinophils Absolute 0.0 0.0 - 0.7 K/uL   Basophils Relative 0 %   Basophils Absolute 0.0 0.0 - 0.1 K/uL   Hemoglobin A1C = 5.4 (normal)  US Ob Comp Less 14 Wks  Result Date: 11/22/2016 CLINICAL DATA:  Pregnant, beta HCG 577, abdominal pain, nausea/ vomiting x1 day EXAM: OBSTETRIC <14 WK [place] AND TRANSVAGINAL OB US TECHNIQUE: Both transabdominal and transvaginal ultrasound examinations were  performed for complete evaluation of the gestation as well as the maternal uterus, adnexal regions, and pelvic cul-de-sac. Transvaginal technique was performed to assess early pregnancy. COMPARISON:  None. FINDINGS: Intrauterine gestational sac: None Yolk sac:  Not Visualized. Embryo:  Not Visualized. Subchorionic hemorrhage:  None visualized. Maternal uterus/adnexae: Endometrial complex measures 16 mm. Bilateral ovaries are within normal limits. Small volume pelvic ascites. IMPRESSION: No IUP is visualized. By definition, in the setting of a positive pregnancy test, this reflects a pregnancy of unknown location. Differential considerations include early normal IUP, abnormal IUP/missed abortion, or nonvisualized ectopic pregnancy. Serial beta HCG all is suggested. Consider repeat pelvic ultrasound in 14 days. Electronically Signed   By: Julian Hy M.D.   On: 11/22/2016 19:51   US Ob Transvaginal  Result Date: 11/22/2016 CLINICAL DATA:  Pregnant, beta HCG 577, abdominal pain, nausea/ vomiting x1 day EXAM: OBSTETRIC <14 WK [place] AND TRANSVAGINAL OB US TECHNIQUE: Both transabdominal and transvaginal ultrasound examinations were performed for complete evaluation of the gestation as well as the maternal uterus, adnexal regions, and pelvic cul-de-sac. Transvaginal technique was performed to assess early pregnancy. COMPARISON:  None. FINDINGS: Intrauterine gestational sac: None Yolk sac:  Not Visualized. Embryo:  Not Visualized. Subchorionic hemorrhage:  None visualized. Maternal uterus/adnexae: Endometrial complex measures 16 mm. Bilateral ovaries are within normal limits. Small volume pelvic ascites. IMPRESSION: No IUP is visualized. By definition, in the setting of a positive pregnancy test, this reflects a pregnancy of unknown location. Differential considerations include early normal IUP, abnormal IUP/missed abortion, or nonvisualized ectopic pregnancy. Serial beta HCG all is suggested. Consider repeat pelvic  ultrasound in 14 days. Electronically Signed   By:  Julian Hy M.D.   On: 11/22/2016 19:51    Assessment: 26 yo here for observation at present for pain and N/V control Quants 577, U/S as above. Elevated glucose. Hbg stable at 13.0.  Plan: Admit to [facility] per consultation w/ Dr. Charlesetta Garibaldi. Meds: Dilaudid 1 mg q 4 hrs prn severe pain            Phenergan 12.5 mg IV q 6 hrs            See orders for remainder of meds NPO status for now LR Glucose stabilizer. If sugars normal x 3, will d/c glucose stabilizer, change IVFs to D5LR and check sugars q 4 hrs (parameters 70-140 per Dr. Charlesetta Garibaldi). Will repeat quants in 48 hrs.   Farrel Gordon 11/22/2016, 11:00 PM   ADDENDUM: Sign out given to both Dr. Mancel Bale and Yvonne Kendall, CNM. Will check Amylase and Lipase. Hbg this morning 12.4. WBC 7.8, 11.0 and 12.8 respectively. Tmax 99.2. Pulse 101-102 overnight, with BPs 111/51 and 105/60 respectively.  Farrel Gordon, CNM 11/23/16, 08:30 AM

## 2016-11-23 DIAGNOSIS — N6002 Solitary cyst of left breast: Secondary | ICD-10-CM | POA: Diagnosis not present

## 2016-11-23 LAB — GLUCOSE, CAPILLARY
GLUCOSE-CAPILLARY: 110 mg/dL — AB (ref 65–99)
GLUCOSE-CAPILLARY: 65 mg/dL (ref 65–99)
Glucose-Capillary: 108 mg/dL — ABNORMAL HIGH (ref 65–99)
Glucose-Capillary: 123 mg/dL — ABNORMAL HIGH (ref 65–99)
Glucose-Capillary: 118 mg/dL — ABNORMAL HIGH (ref 65–99)
Glucose-Capillary: 153 mg/dL — ABNORMAL HIGH (ref 65–99)
Glucose-Capillary: 115 mg/dL — ABNORMAL HIGH (ref 65–99)

## 2016-11-23 LAB — CBC
HCT: 35 % — ABNORMAL LOW (ref 36.0–46.0)
Hemoglobin: 12.4 g/dL (ref 12.0–15.0)
MCH: 32.3 pg (ref 26.0–34.0)
MCHC: 35.4 g/dL (ref 30.0–36.0)
MCV: 91.1 fL (ref 78.0–100.0)
PLATELETS: 198 10*3/uL (ref 150–400)
RBC: 3.84 MIL/uL — ABNORMAL LOW (ref 3.87–5.11)
RDW: 11.8 % (ref 11.5–15.5)
WBC: 12.8 10*3/uL — AB (ref 4.0–10.5)

## 2016-11-23 LAB — AMYLASE: Amylase: 64 U/L (ref 28–100)

## 2016-11-23 LAB — TYPE AND SCREEN
ABO/RH(D): A POS
Antibody Screen: NEGATIVE

## 2016-11-23 LAB — HEMOGLOBIN A1C
Hgb A1c MFr Bld: 5.4 % (ref 4.8–5.6)
Mean Plasma Glucose: 108 mg/dL

## 2016-11-23 LAB — LIPASE, BLOOD: Lipase: 22 U/L (ref 11–51)

## 2016-11-23 MED ORDER — METOCLOPRAMIDE HCL 5 MG/ML IJ SOLN
10.0000 mg | Freq: Three times a day (TID) | INTRAMUSCULAR | Status: DC
  Administered 2016-11-23 – 2016-11-24 (×5): 10 mg via INTRAVENOUS
  Filled 2016-11-23 (×5): qty 2

## 2016-11-23 MED ORDER — DEXTROSE IN LACTATED RINGERS 5 % IV SOLN
INTRAVENOUS | Status: DC
  Administered 2016-11-23 – 2016-11-25 (×7): via INTRAVENOUS

## 2016-11-23 NOTE — Progress Notes (Signed)
Hospital day # 1 pregnancy at [redacted]w[redacted]d  S: well, not throwing up      Contractions:none      Vaginal bleeding:none now       Vaginal discharge: no significant change  O: BP 104/68 (BP Location: Left Arm)   Pulse 84   Temp 99.6 F (37.6 C) (Oral)   Resp 18   Ht 5\' 3"  (1.6 m)   Wt 121 lb (54.9 kg)   LMP 10/20/2016   SpO2 100%   BMI 21.43 kg/m     Results for orders placed or performed during the hospital encounter of 11/22/16 (from the past 24 hour(s))  Urinalysis, Routine w reflex microscopic     Status: Abnormal   Collection Time: 11/22/16  2:27 PM  Result Value Ref Range   Color, Urine YELLOW YELLOW   APPearance HAZY (A) CLEAR   Specific Gravity, Urine 1.031 (H) 1.005 - 1.030   pH 7.0 5.0 - 8.0   Glucose, UA NEGATIVE NEGATIVE mg/dL   Hgb urine dipstick NEGATIVE NEGATIVE   Bilirubin Urine SMALL (A) NEGATIVE   Ketones, ur 80 (A) NEGATIVE mg/dL   Protein, ur 100 (A) NEGATIVE mg/dL   Nitrite NEGATIVE NEGATIVE   Leukocytes, UA TRACE (A) NEGATIVE   RBC / HPF 0-5 0 - 5 RBC/hpf   WBC, UA 0-5 0 - 5 WBC/hpf   Bacteria, UA RARE (A) NONE SEEN   Squamous Epithelial / LPF 0-5 (A) NONE SEEN   Mucous PRESENT   Pregnancy, urine POC     Status: Abnormal   Collection Time: 11/22/16  2:57 PM  Result Value Ref Range   Preg Test, Ur POSITIVE (A) NEGATIVE  CBC     Status: None   Collection Time: 11/22/16  4:18 PM  Result Value Ref Range   WBC 7.8 4.0 - 10.5 K/uL   RBC 4.01 3.87 - 5.11 MIL/uL   Hemoglobin 13.0 12.0 - 15.0 g/dL   HCT 36.8 36.0 - 46.0 %   MCV 91.8 78.0 - 100.0 fL   MCH 32.4 26.0 - 34.0 pg   MCHC 35.3 30.0 - 36.0 g/dL   RDW 11.6 11.5 - 15.5 %   Platelets 197 150 - 400 K/uL  Comprehensive metabolic panel     Status: Abnormal   Collection Time: 11/22/16  4:18 PM  Result Value Ref Range   Sodium 133 (L) 135 - 145 mmol/L   Potassium 3.7 3.5 - 5.1 mmol/L   Chloride 104 101 - 111 mmol/L   CO2 18 (L) 22 - 32 mmol/L   Glucose, Bld 266 (H) 65 - 99 mg/dL   BUN 13 6 - 20 mg/dL    Creatinine, Ser 0.87 0.44 - 1.00 mg/dL   Calcium 9.2 8.9 - 10.3 mg/dL   Total Protein 6.8 6.5 - 8.1 g/dL   Albumin 4.2 3.5 - 5.0 g/dL   AST 26 15 - 41 U/L   ALT 15 14 - 54 U/L   Alkaline Phosphatase 41 38 - 126 U/L   Total Bilirubin 0.9 0.3 - 1.2 mg/dL   GFR calc non Af Amer >60 >60 mL/min   GFR calc Af Amer >60 >60 mL/min   Anion gap 11 5 - 15  Glucose, capillary     Status: Abnormal   Collection Time: 11/22/16  5:40 PM  Result Value Ref Range   Glucose-Capillary 300 (H) 65 - 99 mg/dL  Hemoglobin A1c     Status: None   Collection Time: 11/22/16  6:08 PM  Result  Value Ref Range   Hgb A1c MFr Bld 5.4 4.8 - 5.6 %   Mean Plasma Glucose 108 mg/dL  TSH     Status: None   Collection Time: 11/22/16  6:08 PM  Result Value Ref Range   TSH 0.737 0.350 - 4.500 uIU/mL  hCG, quantitative, pregnancy     Status: Abnormal   Collection Time: 11/22/16  6:08 PM  Result Value Ref Range   hCG, Beta Chain, Quant, S 577 (H) <5 mIU/mL  Glucose, capillary     Status: Abnormal   Collection Time: 11/22/16  8:01 PM  Result Value Ref Range   Glucose-Capillary 132 (H) 65 - 99 mg/dL  CBC with Differential/Platelet     Status: Abnormal   Collection Time: 11/22/16  9:27 PM  Result Value Ref Range   WBC 11.0 (H) 4.0 - 10.5 K/uL   RBC 4.01 3.87 - 5.11 MIL/uL   Hemoglobin 13.0 12.0 - 15.0 g/dL   HCT 36.2 36.0 - 46.0 %   MCV 90.3 78.0 - 100.0 fL   MCH 32.4 26.0 - 34.0 pg   MCHC 35.9 30.0 - 36.0 g/dL   RDW 11.5 11.5 - 15.5 %   Platelets 211 150 - 400 K/uL   Neutrophils Relative % 90 %   Neutro Abs 9.9 (H) 1.7 - 7.7 K/uL   Lymphocytes Relative 8 %   Lymphs Abs 0.9 0.7 - 4.0 K/uL   Monocytes Relative 2 %   Monocytes Absolute 0.2 0.1 - 1.0 K/uL   Eosinophils Relative 0 %   Eosinophils Absolute 0.0 0.0 - 0.7 K/uL   Basophils Relative 0 %   Basophils Absolute 0.0 0.0 - 0.1 K/uL  Glucose, capillary     Status: Abnormal   Collection Time: 11/22/16 11:27 PM  Result Value Ref Range   Glucose-Capillary  108 (H) 65 - 99 mg/dL  Glucose, capillary     Status: Abnormal   Collection Time: 11/23/16 12:37 AM  Result Value Ref Range   Glucose-Capillary 110 (H) 65 - 99 mg/dL  Glucose, capillary     Status: Abnormal   Collection Time: 11/23/16  1:36 AM  Result Value Ref Range   Glucose-Capillary 108 (H) 65 - 99 mg/dL  Type and screen [place]     Status: None   Collection Time: 11/23/16  5:21 AM  Result Value Ref Range   ABO/RH(D) A POS    Antibody Screen NEG    Sample Expiration 11/26/2016   CBC     Status: Abnormal   Collection Time: 11/23/16  5:21 AM  Result Value Ref Range   WBC 12.8 (H) 4.0 - 10.5 K/uL   RBC 3.84 (L) 3.87 - 5.11 MIL/uL   Hemoglobin 12.4 12.0 - 15.0 g/dL   HCT 35.0 (L) 36.0 - 46.0 %   MCV 91.1 78.0 - 100.0 fL   MCH 32.3 26.0 - 34.0 pg   MCHC 35.4 30.0 - 36.0 g/dL   RDW 11.8 11.5 - 15.5 %   Platelets 198 150 - 400 K/uL  Glucose, capillary     Status: Abnormal   Collection Time: 11/23/16  5:43 AM  Result Value Ref Range   Glucose-Capillary 123 (H) 65 - 99 mg/dL  Amylase     Status: None   Collection Time: 11/23/16  6:04 AM  Result Value Ref Range   Amylase 64 28 - 100 U/L  Lipase, blood     Status: None   Collection Time: 11/23/16  6:04 AM  Result  Value Ref Range   Lipase 22 11 - 51 U/L  Glucose, capillary     Status: Abnormal   Collection Time: 11/23/16  9:26 AM  Result Value Ref Range   Glucose-Capillary 118 (H) 65 - 99 mg/dL           Uterus non-tender      Extremities: extremities normal, atraumatic, no cyanosis or edema and no significant edema and no signs of DVT  A: [redacted]w[redacted]d with hyperemesis     gradually improving  P: continue current plan of care     Advance diet      Continue to monitor blood glucose Cecille Rubin A Clemmons  CNM 11/23/2016 11:44 AM

## 2016-11-23 NOTE — Progress Notes (Signed)
S: Earlier today pt tried "eggs and bacon" though she was told to order something bland and was unable to keep it down. In the past hour she has been able to tolerate some graham crackers and a small amount of water.  O; BP 110/85 (BP Location: Left Arm)   Pulse 88   Temp 99.1 F (37.3 C) (Oral)   Resp 18   Ht 5\' 3"  (1.6 m)   Wt 121 lb (54.9 kg)   LMP 10/20/2016   SpO2 100%   BMI 21.43 kg/m   Results for orders placed or performed during the hospital encounter of 11/22/16 (from the past 24 hour(s))  Glucose, capillary     Status: Abnormal   Collection Time: 11/22/16  8:01 PM  Result Value Ref Range   Glucose-Capillary 132 (H) 65 - 99 mg/dL  CBC with Differential/Platelet     Status: Abnormal   Collection Time: 11/22/16  9:27 PM  Result Value Ref Range   WBC 11.0 (H) 4.0 - 10.5 K/uL   RBC 4.01 3.87 - 5.11 MIL/uL   Hemoglobin 13.0 12.0 - 15.0 g/dL   HCT 36.2 36.0 - 46.0 %   MCV 90.3 78.0 - 100.0 fL   MCH 32.4 26.0 - 34.0 pg   MCHC 35.9 30.0 - 36.0 g/dL   RDW 11.5 11.5 - 15.5 %   Platelets 211 150 - 400 K/uL   Neutrophils Relative % 90 %   Neutro Abs 9.9 (H) 1.7 - 7.7 K/uL   Lymphocytes Relative 8 %   Lymphs Abs 0.9 0.7 - 4.0 K/uL   Monocytes Relative 2 %   Monocytes Absolute 0.2 0.1 - 1.0 K/uL   Eosinophils Relative 0 %   Eosinophils Absolute 0.0 0.0 - 0.7 K/uL   Basophils Relative 0 %   Basophils Absolute 0.0 0.0 - 0.1 K/uL  Glucose, capillary     Status: Abnormal   Collection Time: 11/22/16 11:27 PM  Result Value Ref Range   Glucose-Capillary 108 (H) 65 - 99 mg/dL  Glucose, capillary     Status: Abnormal   Collection Time: 11/23/16 12:37 AM  Result Value Ref Range   Glucose-Capillary 110 (H) 65 - 99 mg/dL  Glucose, capillary     Status: Abnormal   Collection Time: 11/23/16  1:36 AM  Result Value Ref Range   Glucose-Capillary 108 (H) 65 - 99 mg/dL  Type and screen [place]     Status: None   Collection Time: 11/23/16  5:21 AM  Result Value  Ref Range   ABO/RH(D) A POS    Antibody Screen NEG    Sample Expiration 11/26/2016   CBC     Status: Abnormal   Collection Time: 11/23/16  5:21 AM  Result Value Ref Range   WBC 12.8 (H) 4.0 - 10.5 K/uL   RBC 3.84 (L) 3.87 - 5.11 MIL/uL   Hemoglobin 12.4 12.0 - 15.0 g/dL   HCT 35.0 (L) 36.0 - 46.0 %   MCV 91.1 78.0 - 100.0 fL   MCH 32.3 26.0 - 34.0 pg   MCHC 35.4 30.0 - 36.0 g/dL   RDW 11.8 11.5 - 15.5 %   Platelets 198 150 - 400 K/uL  Glucose, capillary     Status: Abnormal   Collection Time: 11/23/16  5:43 AM  Result Value Ref Range   Glucose-Capillary 123 (H) 65 - 99 mg/dL  Amylase     Status: None   Collection Time: 11/23/16  6:04 AM  Result Value Ref  Range   Amylase 64 28 - 100 U/L  Lipase, blood     Status: None   Collection Time: 11/23/16  6:04 AM  Result Value Ref Range   Lipase 22 11 - 51 U/L  Glucose, capillary     Status: Abnormal   Collection Time: 11/23/16  9:26 AM  Result Value Ref Range   Glucose-Capillary 118 (H) 65 - 99 mg/dL  Glucose, capillary     Status: Abnormal   Collection Time: 11/23/16  3:31 PM  Result Value Ref Range   Glucose-Capillary 153 (H) 65 - 99 mg/dL   A: Positive pregnancy test     Ectopic not ruled out     Hyperemesis     Elevated Blood glucose  P. Advance diet as tolerated      Reglan 10 mg TID      Continue IVF and antiemetics      HCG Quant tomorrow      Anticipate Discharge tomorrow

## 2016-11-24 DIAGNOSIS — Z3A01 Less than 8 weeks gestation of pregnancy: Secondary | ICD-10-CM | POA: Diagnosis not present

## 2016-11-24 DIAGNOSIS — Z880 Allergy status to penicillin: Secondary | ICD-10-CM | POA: Diagnosis not present

## 2016-11-24 DIAGNOSIS — Z87891 Personal history of nicotine dependence: Secondary | ICD-10-CM | POA: Diagnosis not present

## 2016-11-24 DIAGNOSIS — O26891 Other specified pregnancy related conditions, first trimester: Secondary | ICD-10-CM | POA: Diagnosis present

## 2016-11-24 DIAGNOSIS — R739 Hyperglycemia, unspecified: Secondary | ICD-10-CM | POA: Diagnosis present

## 2016-11-24 DIAGNOSIS — O210 Mild hyperemesis gravidarum: Secondary | ICD-10-CM | POA: Diagnosis present

## 2016-11-24 DIAGNOSIS — R109 Unspecified abdominal pain: Secondary | ICD-10-CM | POA: Diagnosis present

## 2016-11-24 LAB — CULTURE, OB URINE

## 2016-11-24 LAB — GLUCOSE, CAPILLARY
GLUCOSE-CAPILLARY: 120 mg/dL — AB (ref 65–99)
GLUCOSE-CAPILLARY: 86 mg/dL (ref 65–99)
Glucose-Capillary: 92 mg/dL (ref 65–99)
Glucose-Capillary: 102 mg/dL — ABNORMAL HIGH (ref 65–99)
Glucose-Capillary: 139 mg/dL — ABNORMAL HIGH (ref 65–99)
Glucose-Capillary: 96 mg/dL (ref 65–99)

## 2016-11-24 LAB — HCG, QUANTITATIVE, PREGNANCY: hCG, Beta Chain, Quant, S: 1209 m[IU]/mL — ABNORMAL HIGH (ref <5)

## 2016-11-24 NOTE — Progress Notes (Addendum)
Hospital day # 2 pregnancy at [redacted]w[redacted]d  S: Pt is still unable to tolerate food . She threw up applesauce and gingerale about 2 hours ago. She also is still using pain medication for stomach cramping most likely related to dry heaving O: BP 110/72 (BP Location: Left Arm)   Pulse 98   Temp 97.8 F (36.6 C) (Oral)   Resp 18   Ht 5\' 3"  (1.6 m)   Wt 121 lb (54.9 kg)   LMP 10/20/2016   SpO2 100%   BMI 21.43 kg/m            Uterus non-tender      Extremities: extremities normal, atraumatic, no cyanosis or edema and no significant edema and no signs of DVT  A: [redacted]w[redacted]d with hyperemesis     unchanged  P: continue current plan of care  Beta HCG quant today Lori A Clemmons  cnm 11/24/2016 11:07 AM Pt will try a brat diet Beta pending will do [place] if 1500 Abdominal pain improved but still with N/V Blood sugars are better

## 2016-11-25 DIAGNOSIS — R1084 Generalized abdominal pain: Secondary | ICD-10-CM

## 2016-11-25 LAB — GLUCOSE, CAPILLARY
GLUCOSE-CAPILLARY: 94 mg/dL (ref 65–99)
GLUCOSE-CAPILLARY: 107 mg/dL — AB (ref 65–99)
GLUCOSE-CAPILLARY: 109 mg/dL — AB (ref 65–99)
Glucose-Capillary: 108 mg/dL — ABNORMAL HIGH (ref 65–99)
Glucose-Capillary: 103 mg/dL — ABNORMAL HIGH (ref 65–99)
Glucose-Capillary: 94 mg/dL (ref 65–99)

## 2016-11-25 MED ORDER — GLYCOPYRROLATE 1 MG PO TABS
1.0000 mg | ORAL_TABLET | Freq: Two times a day (BID) | ORAL | Status: DC | PRN
  Filled 2016-11-25: qty 1

## 2016-11-25 MED ORDER — METOCLOPRAMIDE HCL 10 MG PO TABS
10.0000 mg | ORAL_TABLET | Freq: Three times a day (TID) | ORAL | Status: DC
  Administered 2016-11-25 – 2016-11-26 (×4): 10 mg via ORAL
  Filled 2016-11-25 (×4): qty 1

## 2016-11-25 MED ORDER — LACTATED RINGERS IV SOLN
INTRAVENOUS | Status: DC
  Administered 2016-11-25 – 2016-11-26 (×3): via INTRAVENOUS

## 2016-11-25 MED ORDER — HYDROMORPHONE HCL 2 MG PO TABS: 2.0000 mg | ORAL_TABLET | ORAL | Status: DC | PRN

## 2016-11-25 MED ORDER — PRENATAL MULTIVITAMIN CH: 1.0000 | ORAL_TABLET | Freq: Every day | ORAL | 0 refills | Status: DC

## 2016-11-25 MED ORDER — GLYCOPYRROLATE 1 MG PO TABS: 1.0000 mg | ORAL_TABLET | Freq: Two times a day (BID) | ORAL | Status: DC | PRN

## 2016-11-25 MED ORDER — PROMETHAZINE HCL 25 MG PO TABS
25.0000 mg | ORAL_TABLET | Freq: Four times a day (QID) | ORAL | Status: DC
  Administered 2016-11-25 – 2016-11-26 (×4): 25 mg via ORAL
  Filled 2016-11-25 (×4): qty 1

## 2016-11-25 MED ORDER — FAMOTIDINE 20 MG PO TABS: 20.0000 mg | ORAL_TABLET | Freq: Two times a day (BID) | ORAL | 0 refills | Status: DC

## 2016-11-25 MED ORDER — PROMETHAZINE HCL 25 MG PO TABS: 25.0000 mg | ORAL_TABLET | Freq: Four times a day (QID) | ORAL | Status: DC

## 2016-11-25 MED ORDER — FAMOTIDINE 20 MG PO TABS
20.0000 mg | ORAL_TABLET | Freq: Two times a day (BID) | ORAL | Status: DC
Start: 2016-11-25 — End: 2016-11-26
  Administered 2016-11-25 – 2016-11-26 (×3): 20 mg via ORAL
  Filled 2016-11-25 (×3): qty 1

## 2016-11-25 MED ORDER — METOCLOPRAMIDE HCL 10 MG PO TABS: 10.0000 mg | ORAL_TABLET | Freq: Three times a day (TID) | ORAL | 0 refills | Status: DC

## 2016-11-25 MED ORDER — PROMETHAZINE HCL 25 MG PO TABS: 25.0000 mg | ORAL_TABLET | Freq: Four times a day (QID) | ORAL | 0 refills | Status: DC

## 2016-11-25 MED ORDER — HYDROMORPHONE HCL 2 MG PO TABS
2.0000 mg | ORAL_TABLET | ORAL | Status: DC | PRN
  Administered 2016-11-25: 2 mg via ORAL
  Filled 2016-11-25: qty 1

## 2016-11-25 NOTE — Progress Notes (Signed)
Pt vomited 100cc clear, yellow emesis with undigested food after attempting to eat lunch. Toya Smothers, RN

## 2016-11-25 NOTE — Discharge Instructions (Signed)
Eating Plan for Hyperemesis Gravidarum °Hyperemesis gravidarum is a severe form of morning sickness. Because this condition causes severe nausea and vomiting, it can lead to dehydration, malnutrition, and weight loss. One way to lessen the symptoms of nausea and vomiting is to follow the eating plan for hyperemesis gravidarum. It is often used along with prescribed medicines to control your symptoms. °What can I do to relieve my symptoms? °Listen to your body. Everyone is different and has different preferences. Find what works best for you. Take any of the following actions that are helpful to you: °· Eat and drink slowly. °· Eat 5-6 small meals daily instead of 3 large meals. °· Eat crackers before you get out of bed in the morning. °· Try having a snack in the middle of the night. °· Starchy foods are usually tolerated well. Examples include cereal, toast, bread, potatoes, pasta, rice, and pretzels. °· Ginger may help with nausea. Add ¼ tsp ground ginger to hot tea or choose ginger tea. °· Try drinking 100% fruit juice or an electrolyte drink. An electrolyte drink contains sodium, potassium, and chloride. °· Continue to take your prenatal vitamins as told by your health care provider. If you are having trouble taking your prenatal vitamins, talk with your health care provider about different options. °· Include at least 1 serving of protein with your meals and snacks. Protein options include meats or poultry, beans, nuts, eggs, and yogurt. Try eating a protein-rich snack before bed. Examples of these snacks include cheese and crackers or half of a peanut butter or turkey sandwich. °· Consider eliminating foods that trigger your symptoms. These may include spicy foods, coffee, high-fat foods, very sweet foods, and acidic foods. °· Try meals that have more protein combined with bland, salty, lower-fat, and dry foods, such as nuts, seeds, pretzels, crackers, and cereal. °· Talk with your healthcare provider about  starting a supplement of vitamin B6. °· Have fluids that are cold, clear, and carbonated or sour. Examples include lemonade, ginger ale, lemon-lime soda, ice water, and sparkling water. °· Try lemon or mint tea. °· Try brushing your teeth or using a mouth rinse after meals. ° °What should I avoid to reduce my symptoms? °Avoiding some of the following things may help reduce your symptoms. °· Foods with strong smells. Try eating meals in well-ventilated areas that are free of odors. °· Drinking water or other beverages with meals. Try not to drink anything during the 30 minutes before and after your meals. °· Drinking more than 1 cup of fluid at a time. Sometimes using a straw helps. °· Fried or high-fat foods, such as butter and cream sauces. °· Spicy foods. °· Skipping meals as best as you can. Nausea can be more intense on an empty stomach. If you cannot tolerate food at that time, do not force it. Try sucking on ice chips or other frozen items, and make up for missed calories later. °· Lying down within 2 hours after eating. °· Environmental triggers. These may include smoky rooms, closed spaces, rooms with strong smells, warm or humid places, overly loud and noisy rooms, and rooms with motion or flickering lights. °· Quick and sudden changes in your movement. ° °This information is not intended to replace advice given to you by your health care provider. Make sure you discuss any questions you have with your health care provider. °Document Released: 02/17/2007 Document Revised: 12/20/2015 Document Reviewed: 11/21/2015 °Elsevier Interactive Patient Education © 2018 [employer] ° ° °Hyperemesis Gravidarum °  Hyperemesis gravidarum is a severe form of nausea and vomiting that happens during pregnancy. Hyperemesis is worse than morning sickness. It may cause you to have nausea or vomiting all day for many days. It may keep you from eating and drinking enough food and liquids. Hyperemesis usually occurs during the  first half (the first 20 weeks) of pregnancy. It often goes away once a woman is in her second half of pregnancy. However, sometimes hyperemesis continues through an entire pregnancy. °What are the causes? °The cause of this condition is not known. It may be related to changes in chemicals (hormones) in the body during pregnancy, such as the high level of pregnancy hormone (human chorionic gonadotropin) or the increase in the female sex hormone (estrogen). °What are the signs or symptoms? °Symptoms of this condition include: °· Severe nausea and vomiting. °· Nausea that does not go away. °· Vomiting that does not allow you to keep any food down. °· Weight loss. °· Body fluid loss (dehydration). °· Having no desire to eat, or not liking food that you have previously enjoyed. ° °How is this diagnosed? °This condition may be diagnosed based on: °· A physical exam. °· Your medical history. °· Your symptoms. °· Blood tests. °· Urine tests. ° °How is this treated? °This condition may be managed with medicine. If medicines to do not help relieve nausea and vomiting, you may need to receive fluids through an IV tube at the hospital. °Follow these instructions at home: °· Take over-the-counter and prescription medicines only as told by your health care provider. °· Avoid iron pills and multivitamins that contain iron for the first 3-4 months of pregnancy. If you take prescription iron pills, do not stop taking them unless your health care provider approves. °· Take the following actions to help prevent nausea and vomiting: °? In the morning, before getting out of bed, try eating a couple of dry crackers or a piece of toast. °? Avoid foods and smells that upset your stomach. Fatty and spicy foods may make nausea worse. °? Eat 5-6 small meals a day. °? Do not drink fluids while eating meals. Drink between meals. °? Eat or suck on things that have ginger in them. Ginger can help relieve nausea. °? Avoid food preparation. The  smell of food can spoil your appetite or trigger nausea. °· Follow instructions from your health care provider about eating or drinking restrictions. °· For snacks, eat high-protein foods, such as cheese. °· Keep all follow-up and pre-birth (prenatal) visits as told by your health care provider. This is important. °Contact a health care provider if: °· You have pain in your abdomen. °· You have a severe headache. °· You have vision problems. °· You are losing weight. °Get help right away if: °· You cannot drink fluids without vomiting. °· You vomit blood. °· You have constant nausea and vomiting. °· You are very weak. °· You are very thirsty. °· You feel dizzy. °· You faint. °· You have a fever or other symptoms that last for more than 2-3 days. °· You have a fever and your symptoms suddenly get worse. °Summary °· Hyperemesis gravidarum is a severe form of nausea and vomiting that happens during pregnancy. °· Making some changes to your eating habits may help relieve nausea and vomiting. °· This condition may be managed with medicine. °· If medicines to do not help relieve nausea and vomiting, you may need to receive fluids through an IV tube at the hospital. °This information   is not intended to replace advice given to you by your health care provider. Make sure you discuss any questions you have with your health care provider. °Document Released: 04/22/2005 Document Revised: 12/20/2015 Document Reviewed: 12/20/2015 °Elsevier Interactive Patient Education © 2017 [employer] ° °

## 2016-11-25 NOTE — Progress Notes (Addendum)
KEALI, MCCRAW Female, 26 y.o., 1990/11/12  Subjective: Patient reports nausea and vomiting.  She vomited her lunch of soup, chicken, green beans.  With mid epigastric pain.  She denies lower abdominal pain or vaginal bleeding.     Objective: I have reviewed patient's vital signs, intake and output, medications and labs. Vitals:   11/25/16 0030 11/25/16 0505 11/25/16 0800 11/25/16 1200  BP: (!) 105/59 (!) 105/57 (!) 101/58 (!) 108/59  Pulse: 65 69 82 85  Resp: 18 18 18 18   Temp: 99.5 F (37.5 C) 99.5 F (37.5 C) 99.1 F (37.3 C) 99.4 F (37.4 C)  TempSrc: Oral Oral Oral Oral  SpO2: 100% 100% 100% 99%  Weight:      Height:        General: alert, cooperative and no distress GI: soft, non-tender; bowel sounds normal; no masses,  no organomegaly and soft, mild epigastric tenderness, no rebound, no guarding. Lower abdomen soft, non tender.  Extremities: extremities normal, atraumatic, no cyanosis or edema  SCHEDULED MEDS:  . docusate sodium  100 mg Oral Daily  . metoCLOPramide  10 mg Oral Q8H  . prenatal multivitamin  1 tablet Oral Q1200  . promethazine  25 mg Oral Q6H    PRN MEDS acetaminophen, calcium carbonate, glycopyrrolate, HYDROmorphone, zolpidem  Assessment/Plan: 26 y/o [id number] @ 5 W 1 D with hyperemesis gravidarum (HG), -Recently switched to oral antiemetics from IV antiemetics.  She is tolerating oral antiemetics but threw up regular diet.  BRAT diet ordered for next meal. - Anti reflux medicine ordered.  -If HG improves and patient is for discharge soon plan for f/u in office with [place] check for viability.    LOS: 1 day    Alinda Dooms, MD.  11/25/2016, 2:47 PM

## 2016-11-25 NOTE — Discharge Summary (Signed)
Physician Discharge Summary  Patient ID: Debbie Hughes MRN: [id number] DOB/AGE: 1990/06/29 26 y.o.  Admit date: 11/22/2016 Discharge date: 11/26/2016  Admission Diagnoses: Acute abdominal pain of unknown etiology, Hyperemesis Gravidarum  Discharge Diagnoses:  Acute abdominal pain and Hyperemesis Gravidarum  Discharged Condition: stable  Hospital Course: Debbie Hughes is a 26 y.o. female, [id number] @ 5.1 wks, w/ last known menstrual period of 10/21/16. She presented to MAU on 11/22/16 w/ c/o increased N/V x 24 hrs, and vague "internal pain" that radiated from her epigastric region to the bottom of her stomach. At the time of initial evaluation, she was lying in bed thrashing around in pain with periodic retching. She did not have an episode of witnessed vomiting until after receiving 1 gram of Tylenol.  History significant for ectopic (left salpingectomy) in 2014, and hyperemesis in her last pregnancy that required steroid taper.  Over the course of 3 days, pt received an IVF bolus, continuous fluids, IV pain meds prn and IV antiemetics around the clock. Early on HD 2, pt began feeling better until she ate non bland foods at which time vomiting/retching returned. Her tolerance improved with teaching. Later on HD2, she was able to consume Cream of Chicken & Rice Soup w/ crackers from [employer], [place] Cheerios and Rice Krispies w/o emesis or pain. Last vomited early on HD2. On HD 3, all IV meds were d/c'd with the exception of maintenance fluids to further po challenge.   Of note, her blood sugar was 300 on admission, then decreased to 132. She was put on a Glucose Stabilizer, and after 3 normal sugars in a row, stabilizer was discontinued and sugars were monitored q 4 hours. Pt initially received LR, then fluids were switched to D5LR due to NPO status. Plan was to switch back to LR once bland diet was introduced. After demonstrating that she could keep down all meds, foods and po fluids, her IV was  d/c'd, and she was d/c'd home in stable condition.   Per Dr. Alesia Richards, pt should return to the office in 1 wk for viability scan (quants appropriately rose in 48 hrs), then scheduled for OB interview as clinically indicated. Initial u/s was w/o GS, YS or embryo - see report below. Pt instructed to continue BRAT diet and slowly advance as tolerated.  Consults: None  Significant Diagnostic Studies: labs:  Recent Results (from the past 2160 hour(s))  Urinalysis, Routine w reflex microscopic     Status: Abnormal   Collection Time: 11/22/16  2:27 PM  Result Value Ref Range   Color, Urine YELLOW YELLOW   APPearance HAZY (A) CLEAR   Specific Gravity, Urine 1.031 (H) 1.005 - 1.030   pH 7.0 5.0 - 8.0   Glucose, UA NEGATIVE NEGATIVE mg/dL   Hgb urine dipstick NEGATIVE NEGATIVE   Bilirubin Urine SMALL (A) NEGATIVE   Ketones, ur 80 (A) NEGATIVE mg/dL   Protein, ur 100 (A) NEGATIVE mg/dL   Nitrite NEGATIVE NEGATIVE   Leukocytes, UA TRACE (A) NEGATIVE   RBC / HPF 0-5 0 - 5 RBC/hpf   WBC, UA 0-5 0 - 5 WBC/hpf   Bacteria, UA RARE (A) NONE SEEN   Squamous Epithelial / LPF 0-5 (A) NONE SEEN   Mucous PRESENT   OB Urine Culture     Status: Abnormal   Collection Time: 11/22/16  2:40 PM  Result Value Ref Range   Specimen Description OB CLEAN CATCH    Special Requests NONE    Culture (A)  MULTIPLE SPECIES PRESENT, SUGGEST RECOLLECTION NO GROUP B STREP (S.AGALACTIAE) ISOLATED Performed at [facility], [place] [address], [place], [place] [address]    Report Status 11/24/2016 FINAL   Pregnancy, urine POC     Status: Abnormal   Collection Time: 11/22/16  2:57 PM  Result Value Ref Range   Preg Test, Ur POSITIVE (A) NEGATIVE    Comment:        THE SENSITIVITY OF THIS METHODOLOGY IS >24 mIU/mL   CBC     Status: None   Collection Time: 11/22/16  4:18 PM  Result Value Ref Range   WBC 7.8 4.0 - 10.5 K/uL   RBC 4.01 3.87 - 5.11 MIL/uL   Hemoglobin 13.0 12.0 - 15.0 g/dL   HCT 36.8 36.0 - 46.0 %    MCV 91.8 78.0 - 100.0 fL   MCH 32.4 26.0 - 34.0 pg   MCHC 35.3 30.0 - 36.0 g/dL   RDW 11.6 11.5 - 15.5 %   Platelets 197 150 - 400 K/uL  Comprehensive metabolic panel     Status: Abnormal   Collection Time: 11/22/16  4:18 PM  Result Value Ref Range   Sodium 133 (L) 135 - 145 mmol/L   Potassium 3.7 3.5 - 5.1 mmol/L   Chloride 104 101 - 111 mmol/L   CO2 18 (L) 22 - 32 mmol/L   Glucose, Bld 266 (H) 65 - 99 mg/dL   BUN 13 6 - 20 mg/dL   Creatinine, Ser 0.87 0.44 - 1.00 mg/dL   Calcium 9.2 8.9 - 10.3 mg/dL   Total Protein 6.8 6.5 - 8.1 g/dL   Albumin 4.2 3.5 - 5.0 g/dL   AST 26 15 - 41 U/L   ALT 15 14 - 54 U/L   Alkaline Phosphatase 41 38 - 126 U/L   Total Bilirubin 0.9 0.3 - 1.2 mg/dL   GFR calc non Af Amer >60 >60 mL/min   GFR calc Af Amer >60 >60 mL/min    Comment: (NOTE) The eGFR has been calculated using the CKD EPI equation. This calculation has not been validated in all clinical situations. eGFR's persistently <60 mL/min signify possible Chronic Kidney Disease.    Anion gap 11 5 - 15  Glucose, capillary     Status: Abnormal   Collection Time: 11/22/16  5:40 PM  Result Value Ref Range   Glucose-Capillary 300 (H) 65 - 99 mg/dL  Hemoglobin A1c     Status: None   Collection Time: 11/22/16  6:08 PM  Result Value Ref Range   Hgb A1c MFr Bld 5.4 4.8 - 5.6 %    Comment: (NOTE)         Pre-diabetes: 5.7 - 6.4         Diabetes: >6.4         Glycemic control for adults with diabetes: <7.0    Mean Plasma Glucose 108 mg/dL    Comment: (NOTE) Performed At: [facility] [place], [place] [id number] Lindon Romp MD LG:[id number]   TSH     Status: None   Collection Time: 11/22/16  6:08 PM  Result Value Ref Range   TSH 0.737 0.350 - 4.500 uIU/mL    Comment: Performed by a 3rd Generation assay with a functional sensitivity of <=0.01 uIU/mL.  hCG, quantitative, pregnancy     Status: Abnormal   Collection Time: 11/22/16  6:08 PM  Result Value Ref  Range   hCG, Beta Chain, Quant, S 577 (H) <5 mIU/mL  Comment:          GEST. AGE      CONC.  (mIU/mL)   <=1 WEEK        5 - 50     2 WEEKS       50 - 500     3 WEEKS       100 - 10,000     4 WEEKS     1,000 - 30,000     5 WEEKS     3,500 - 115,000   6-8 WEEKS     12,000 - 270,000    12 WEEKS     15,000 - 220,000        FEMALE AND NON-PREGNANT FEMALE:     LESS THAN 5 mIU/mL   Glucose, capillary     Status: Abnormal   Collection Time: 11/22/16  8:01 PM  Result Value Ref Range   Glucose-Capillary 132 (H) 65 - 99 mg/dL  CBC with Differential/Platelet     Status: Abnormal   Collection Time: 11/22/16  9:27 PM  Result Value Ref Range   WBC 11.0 (H) 4.0 - 10.5 K/uL   RBC 4.01 3.87 - 5.11 MIL/uL   Hemoglobin 13.0 12.0 - 15.0 g/dL   HCT 36.2 36.0 - 46.0 %   MCV 90.3 78.0 - 100.0 fL   MCH 32.4 26.0 - 34.0 pg   MCHC 35.9 30.0 - 36.0 g/dL   RDW 11.5 11.5 - 15.5 %   Platelets 211 150 - 400 K/uL   Neutrophils Relative % 90 %   Neutro Abs 9.9 (H) 1.7 - 7.7 K/uL   Lymphocytes Relative 8 %   Lymphs Abs 0.9 0.7 - 4.0 K/uL   Monocytes Relative 2 %   Monocytes Absolute 0.2 0.1 - 1.0 K/uL   Eosinophils Relative 0 %   Eosinophils Absolute 0.0 0.0 - 0.7 K/uL   Basophils Relative 0 %   Basophils Absolute 0.0 0.0 - 0.1 K/uL  Glucose, capillary     Status: Abnormal   Collection Time: 11/22/16 11:27 PM  Result Value Ref Range   Glucose-Capillary 108 (H) 65 - 99 mg/dL  Glucose, capillary     Status: Abnormal   Collection Time: 11/23/16 12:37 AM  Result Value Ref Range   Glucose-Capillary 110 (H) 65 - 99 mg/dL  Glucose, capillary     Status: Abnormal   Collection Time: 11/23/16  1:36 AM  Result Value Ref Range   Glucose-Capillary 108 (H) 65 - 99 mg/dL  Type and screen [place]     Status: None   Collection Time: 11/23/16  5:21 AM  Result Value Ref Range   ABO/RH(D) A POS    Antibody Screen NEG    Sample Expiration 11/26/2016   CBC     Status: Abnormal   Collection  Time: 11/23/16  5:21 AM  Result Value Ref Range   WBC 12.8 (H) 4.0 - 10.5 K/uL   RBC 3.84 (L) 3.87 - 5.11 MIL/uL   Hemoglobin 12.4 12.0 - 15.0 g/dL   HCT 35.0 (L) 36.0 - 46.0 %   MCV 91.1 78.0 - 100.0 fL   MCH 32.3 26.0 - 34.0 pg   MCHC 35.4 30.0 - 36.0 g/dL   RDW 11.8 11.5 - 15.5 %   Platelets 198 150 - 400 K/uL  Glucose, capillary     Status: Abnormal   Collection Time: 11/23/16  5:43 AM  Result Value Ref Range   Glucose-Capillary 123 (H) 65 - 99  mg/dL  Amylase     Status: None   Collection Time: 11/23/16  6:04 AM  Result Value Ref Range   Amylase 64 28 - 100 U/L  Lipase, blood     Status: None   Collection Time: 11/23/16  6:04 AM  Result Value Ref Range   Lipase 22 11 - 51 U/L  Glucose, capillary     Status: Abnormal   Collection Time: 11/23/16  9:26 AM  Result Value Ref Range   Glucose-Capillary 118 (H) 65 - 99 mg/dL  Glucose, capillary     Status: Abnormal   Collection Time: 11/23/16  3:31 PM  Result Value Ref Range   Glucose-Capillary 153 (H) 65 - 99 mg/dL  Glucose, capillary     Status: Abnormal   Collection Time: 11/23/16  7:29 PM  Result Value Ref Range   Glucose-Capillary 115 (H) 65 - 99 mg/dL   Comment 1 Notify RN   Glucose, capillary     Status: None   Collection Time: 11/23/16 11:34 PM  Result Value Ref Range   Glucose-Capillary 65 65 - 99 mg/dL  Glucose, capillary     Status: None   Collection Time: 11/24/16 12:38 AM  Result Value Ref Range   Glucose-Capillary 86 65 - 99 mg/dL  Glucose, capillary     Status: Abnormal   Collection Time: 11/24/16  4:25 AM  Result Value Ref Range   Glucose-Capillary 120 (H) 65 - 99 mg/dL  Glucose, capillary     Status: None   Collection Time: 11/24/16  8:40 AM  Result Value Ref Range   Glucose-Capillary 92 65 - 99 mg/dL  hCG, quantitative, pregnancy     Status: Abnormal   Collection Time: 11/24/16 11:26 AM  Result Value Ref Range   hCG, Beta Chain, Quant, S 1,209 (H) <5 mIU/mL    Comment:          GEST. AGE       CONC.  (mIU/mL)   <=1 WEEK        5 - 50     2 WEEKS       50 - 500     3 WEEKS       100 - 10,000     4 WEEKS     1,000 - 30,000     5 WEEKS     3,500 - 115,000   6-8 WEEKS     12,000 - 270,000    12 WEEKS     15,000 - 220,000        FEMALE AND NON-PREGNANT FEMALE:     LESS THAN 5 mIU/mL   Glucose, capillary     Status: Abnormal   Collection Time: 11/24/16 12:28 PM  Result Value Ref Range   Glucose-Capillary 102 (H) 65 - 99 mg/dL  Glucose, capillary     Status: Abnormal   Collection Time: 11/24/16  4:39 PM  Result Value Ref Range   Glucose-Capillary 139 (H) 65 - 99 mg/dL  Glucose, capillary     Status: None   Collection Time: 11/24/16  8:56 PM  Result Value Ref Range   Glucose-Capillary 96 65 - 99 mg/dL  Glucose, capillary     Status: Abnormal   Collection Time: 11/25/16  1:01 AM  Result Value Ref Range   Glucose-Capillary 108 (H) 65 - 99 mg/dL  Glucose, capillary     Status: Abnormal   Collection Time: 11/25/16  5:04 AM  Result Value Ref Range   Glucose-Capillary 103 (H) 65 -  99 mg/dL  Glucose, capillary     Status: None   Collection Time: 11/25/16  8:52 AM  Result Value Ref Range   Glucose-Capillary 94 65 - 99 mg/dL  Glucose, capillary     Status: None   Collection Time: 11/25/16  1:01 PM  Result Value Ref Range   Glucose-Capillary 94 65 - 99 mg/dL  Glucose, capillary     Status: Abnormal   Collection Time: 11/25/16  5:38 PM  Result Value Ref Range   Glucose-Capillary 107 (H) 65 - 99 mg/dL  Glucose, capillary     Status: Abnormal   Collection Time: 11/25/16  9:10 PM  Result Value Ref Range   Glucose-Capillary 109 (H) 65 - 99 mg/dL  Glucose, capillary     Status: Abnormal   Collection Time: 11/26/16 12:59 AM  Result Value Ref Range   Glucose-Capillary 111 (H) 65 - 99 mg/dL  Glucose, capillary     Status: Abnormal   Collection Time: 11/26/16  4:59 AM  Result Value Ref Range   Glucose-Capillary 110 (H) 65 - 99 mg/dL   Study Result   CLINICAL DATA:  Pregnant,  beta HCG 577, abdominal pain, nausea/ vomiting x1 day  EXAM: OBSTETRIC <14 WK [place] AND TRANSVAGINAL OB US  TECHNIQUE: Both transabdominal and transvaginal ultrasound examinations were performed for complete evaluation of the gestation as well as the maternal uterus, adnexal regions, and pelvic cul-de-sac. Transvaginal technique was performed to assess early pregnancy.  COMPARISON:  None.  FINDINGS: Intrauterine gestational sac: None  Yolk sac:  Not Visualized.  Embryo:  Not Visualized.  Subchorionic hemorrhage:  None visualized.  Maternal uterus/adnexae: Endometrial complex measures 16 mm.  Bilateral ovaries are within normal limits.  Small volume pelvic ascites.  IMPRESSION: No IUP is visualized.  By definition, in the setting of a positive pregnancy test, this reflects a pregnancy of unknown location. Differential considerations include early normal IUP, abnormal IUP/missed abortion, or nonvisualized ectopic pregnancy.  Serial beta HCG all is suggested. Consider repeat pelvic ultrasound in 14 days.   Electronically Signed   By: Julian Hy M.D.   On: 11/22/2016 19:51    Treatments: IV hydration, analgesia: Dilaudid and antiemetics  Discharge Exam: Blood pressure 115/73, pulse 83, temperature 99.3 F (37.4 C), temperature source Oral, resp. rate 18, height 5' 3"  (1.6 m), weight 54.9 kg (121 lb), last menstrual period 10/20/2016, SpO2 100 %, unknown if currently breastfeeding.  General appearance: NAD, smiling Resp: clear to auscultation bilaterally Cardio: regular rate and rhythm, S1, S2 normal, no murmur, click, rub or gallop Pelvic: uterus normal size, shape, and consistency Extremities: extremities normal, atraumatic, no cyanosis or edema. DTR 2+, no clonus, no edema  Disposition: 01-Home or Self Care  Discharge Instructions    Discharge activity:  No Restrictions    Complete by:  As directed    Discharge diet:    Complete by:  As  directed    Discharge instructions    Complete by:  As directed    Maintain bland/soft diet for 3-4 more days Take medication as prescribed Call for any questions or concerns     Allergies as of 11/26/2016      Reactions   Penicillins Rash   Has patient had a PCN reaction causing immediate rash, facial/tongue/throat swelling, SOB or lightheadedness with hypotension: yes Has patient had a PCN reaction causing severe rash involving mucus membranes or skin necrosis: no Has patient had a PCN reaction that required hospitalization no Has patient had a PCN reaction  occurring within the last 10 years: yes If all of the above answers are "NO", then may proceed with Cephalosporin use.      Medication List    STOP taking these medications   ondansetron 8 MG disintegrating tablet Commonly known as:  ZOFRAN ODT   XULANE 150-35 MCG/24HR transdermal patch Generic drug:  norelgestromin-ethinyl estradiol     TAKE these medications   famotidine 20 MG tablet Commonly known as:  PEPCID Take 1 tablet (20 mg total) by mouth 2 (two) times daily.   metoCLOPramide 10 MG tablet Commonly known as:  REGLAN Take 1 tablet (10 mg total) by mouth every 8 (eight) hours.   promethazine 25 MG tablet Commonly known as:  PHENERGAN Take 1 tablet (25 mg total) by mouth every 6 (six) hours.      Follow-up [facility]. Go on 12/02/2016.   Specialty:  Obstetrics and Gynecology Why:  Office will call to schedule ultrasound. Contact information: [address] [place] [address] [phone]          Signed: Farrel Gordon 11/26/2016, 6 AM

## 2016-11-26 ENCOUNTER — Encounter (HOSPITAL_COMMUNITY)
Admission: AD | Disposition: A | Discharge status: Home / Self Care | Payer: Self-pay | Source: Ambulatory Visit | Attending: Obstetrics and Gynecology

## 2016-11-26 LAB — GLUCOSE, CAPILLARY
GLUCOSE-CAPILLARY: 110 mg/dL — AB (ref 65–99)
GLUCOSE-CAPILLARY: 111 mg/dL — AB (ref 65–99)

## 2016-11-26 SURGERY — LIGATION, FALLOPIAN TUBE, POSTPARTUM
Anesthesia: Choice

## 2016-11-26 NOTE — Discharge Summary (Signed)
Physician Discharge Summary  Patient ID: Debbie Hughes MRN: [id number] DOB/AGE: Nov 14, 1990 26 y.o.  Admit date: 11/22/2016 Discharge date: 11/26/2016  Admission Diagnoses: Hyperemesis Gravidarum, Abdominal Pain  Discharge Diagnoses:  Principal Problem:   Hyperemesis gravidarum Active Problems:   Breast cyst, left (excised in 2007)   Acute generalized abdominal pain   Discharged Condition: Improved- stable  Hospital Course: IV pain medication, IV antiemetics, IV hydration.   Consults: None  Significant Diagnostic Studies: labs: Quants, CBC, CBGs  Treatments: IV hydration, analgesia: Dilaudid and IV antiemetics  Discharge Exam: Blood pressure (!) 104/48, pulse 86, temperature 98.7 F (37.1 C), resp. rate 18, height 5\' 3"  (1.6 m), weight 54.9 kg (121 lb), last menstrual period 10/20/2016, SpO2 99 %, unknown if currently breastfeeding. General appearance: alert, cooperative and no distress Chest wall: no tenderness Cardio: regular rate and rhythm Extremities: no edema, redness or tenderness in the calves or thighs Pulses: 2+ and symmetric Skin: normal, no edema and temperature normal  Disposition: 01-Home or Self Care  Discharge Instructions    Discharge activity:  No Restrictions    Complete by:  As directed    Discharge diet:    Complete by:  As directed    Discharge instructions    Complete by:  As directed    Maintain bland/soft diet for 3-4 more days Take medication as prescribed Call for any questions or concerns     Allergies as of 11/26/2016      Reactions   Penicillins Rash   Has patient had a PCN reaction causing immediate rash, facial/tongue/throat swelling, SOB or lightheadedness with hypotension: yes Has patient had a PCN reaction causing severe rash involving mucus membranes or skin necrosis: no Has patient had a PCN reaction that required hospitalization no Has patient had a PCN reaction occurring within the last 10 years: yes If all of the above  answers are "NO", then may proceed with Cephalosporin use.      Medication List    STOP taking these medications   ondansetron 8 MG disintegrating tablet Commonly known as:  ZOFRAN ODT   XULANE 150-35 MCG/24HR transdermal patch Generic drug:  norelgestromin-ethinyl estradiol     TAKE these medications   famotidine 20 MG tablet Commonly known as:  PEPCID Take 1 tablet (20 mg total) by mouth 2 (two) times daily.   metoCLOPramide 10 MG tablet Commonly known as:  REGLAN Take 1 tablet (10 mg total) by mouth every 8 (eight) hours.   promethazine 25 MG tablet Commonly known as:  PHENERGAN Take 1 tablet (25 mg total) by mouth every 6 (six) hours.      Follow-up [facility]. Go on 12/02/2016.   Specialty:  Obstetrics and Gynecology Why:  Office will call to schedule ultrasound. Contact information: [address]. [address] [place] [address] [phone]          Signed: Maryann Conners 11/26/2016, 6:30 AM

## 2016-11-26 NOTE — Accreditation Note (Signed)
Pt discharged to home with husband.  Condition stable.  Pt ambulated to car with Verdie Drown, NT.  No equipment for home ordered at discharge.

## 2016-12-17 DIAGNOSIS — Z3490 Encounter for supervision of normal pregnancy, unspecified, unspecified trimester: Secondary | ICD-10-CM | POA: Insufficient documentation

## 2016-12-17 LAB — OB RESULTS CONSOLE ABO/RH: RH TYPE: POSITIVE

## 2016-12-17 LAB — OB RESULTS CONSOLE ANTIBODY SCREEN: Antibody Screen: NEGATIVE

## 2016-12-17 LAB — OB RESULTS CONSOLE HEPATITIS B SURFACE ANTIGEN: HEP B S AG: NEGATIVE

## 2016-12-17 LAB — OB RESULTS CONSOLE RUBELLA ANTIBODY, IGM: RUBELLA: IMMUNE

## 2016-12-17 LAB — OB RESULTS CONSOLE HIV ANTIBODY (ROUTINE TESTING): HIV: NONREACTIVE

## 2016-12-17 LAB — OB RESULTS CONSOLE GC/CHLAMYDIA
Chlamydia: NEGATIVE
Gonorrhea: NEGATIVE

## 2016-12-17 LAB — OB RESULTS CONSOLE RPR: RPR: NONREACTIVE

## 2017-05-06 NOTE — L&D Delivery Note (Signed)
Delivery Note At 11:20 AM a viable female "Debbie Hughes" was delivered via Vaginal, Spontaneous (Presentation:ROA ;  ).  APGAR: 9, 9; weight pending .   Placenta status: appears small but normal.  Cord:Normal, 3 vessels.    Anesthesia: IV fentanyl, local lidocaine.   Episiotomy: None Lacerations: 1st degree Suture Repair: 3.0 vicryl Est. Blood Loss (mL): 50  Mom to postpartum.  Baby to Couplet care / Skin to Skin.  Alinda Dooms, MD/  07/25/2017, 12:01 PM

## 2017-07-02 LAB — OB RESULTS CONSOLE GBS: STREP GROUP B AG: POSITIVE

## 2017-07-23 ENCOUNTER — Telehealth (HOSPITAL_COMMUNITY): Payer: Self-pay | Admitting: *Deleted

## 2017-07-23 ENCOUNTER — Encounter (HOSPITAL_COMMUNITY): Payer: Self-pay | Admitting: *Deleted

## 2017-07-23 ENCOUNTER — Other Ambulatory Visit: Payer: Self-pay | Admitting: Obstetrics & Gynecology

## 2017-07-23 NOTE — Telephone Encounter (Signed)
Preadmission screen  

## 2017-07-25 ENCOUNTER — Inpatient Hospital Stay (HOSPITAL_COMMUNITY)
Admission: AD | Admit: 2017-07-25 | Discharge: 2017-07-27 | DRG: 807 | Disposition: A | Discharge status: Home / Self Care | Payer: BC Managed Care – PPO | Source: Ambulatory Visit | Attending: Obstetrics & Gynecology | Admitting: Obstetrics & Gynecology

## 2017-07-25 ENCOUNTER — Encounter (HOSPITAL_COMMUNITY): Payer: Self-pay

## 2017-07-25 ENCOUNTER — Other Ambulatory Visit: Payer: Self-pay

## 2017-07-25 DIAGNOSIS — Z87891 Personal history of nicotine dependence: Secondary | ICD-10-CM

## 2017-07-25 DIAGNOSIS — Z3A39 39 weeks gestation of pregnancy: Secondary | ICD-10-CM | POA: Diagnosis not present

## 2017-07-25 DIAGNOSIS — Z3483 Encounter for supervision of other normal pregnancy, third trimester: Secondary | ICD-10-CM | POA: Diagnosis present

## 2017-07-25 DIAGNOSIS — O99824 Streptococcus B carrier state complicating childbirth: Principal | ICD-10-CM | POA: Diagnosis present

## 2017-07-25 DIAGNOSIS — Z880 Allergy status to penicillin: Secondary | ICD-10-CM | POA: Diagnosis not present

## 2017-07-25 LAB — TYPE AND SCREEN
ABO/RH(D): A POS
ANTIBODY SCREEN: NEGATIVE

## 2017-07-25 LAB — CBC
HCT: 36.2 % (ref 36.0–46.0)
HEMOGLOBIN: 12.8 g/dL (ref 12.0–15.0)
MCH: 33.2 pg (ref 26.0–34.0)
MCHC: 35.4 g/dL (ref 30.0–36.0)
MCV: 93.8 fL (ref 78.0–100.0)
PLATELETS: 182 10*3/uL (ref 150–400)
RBC: 3.86 MIL/uL — ABNORMAL LOW (ref 3.87–5.11)
RDW: 12.3 % (ref 11.5–15.5)
WBC: 9.7 10*3/uL (ref 4.0–10.5)

## 2017-07-25 MED ORDER — LIDOCAINE HCL (PF) 1 % IJ SOLN
30.0000 mL | INTRAMUSCULAR | Status: AC | PRN
  Administered 2017-07-25: 30 mL via SUBCUTANEOUS
  Filled 2017-07-25: qty 30

## 2017-07-25 MED ORDER — OXYCODONE-ACETAMINOPHEN 5-325 MG PO TABS: 1.0000 | ORAL_TABLET | ORAL | Status: DC | PRN

## 2017-07-25 MED ORDER — ACETAMINOPHEN 325 MG PO TABS: 650.0000 mg | ORAL_TABLET | ORAL | Status: DC | PRN

## 2017-07-25 MED ORDER — DIBUCAINE 1 % RE OINT: 1.0000 "application " | TOPICAL_OINTMENT | RECTAL | Status: DC | PRN

## 2017-07-25 MED ORDER — PRENATAL MULTIVITAMIN CH
1.0000 | ORAL_TABLET | Freq: Every day | ORAL | Status: DC
  Administered 2017-07-26: 1 via ORAL
  Filled 2017-07-25: qty 1

## 2017-07-25 MED ORDER — LACTATED RINGERS IV SOLN: 500.0000 mL | INTRAVENOUS | Status: DC | PRN

## 2017-07-25 MED ORDER — BENZOCAINE-MENTHOL 20-0.5 % EX AERO: 1.0000 "application " | INHALATION_SPRAY | CUTANEOUS | Status: DC | PRN

## 2017-07-25 MED ORDER — DIPHENHYDRAMINE HCL 25 MG PO CAPS: 25.0000 mg | ORAL_CAPSULE | Freq: Four times a day (QID) | ORAL | Status: DC | PRN

## 2017-07-25 MED ORDER — SIMETHICONE 80 MG PO CHEW: 80.0000 mg | CHEWABLE_TABLET | ORAL | Status: DC | PRN

## 2017-07-25 MED ORDER — ZOLPIDEM TARTRATE 5 MG PO TABS: 5.0000 mg | ORAL_TABLET | Freq: Every evening | ORAL | Status: DC | PRN

## 2017-07-25 MED ORDER — FLEET ENEMA 7-19 GM/118ML RE ENEM: 1.0000 | ENEMA | RECTAL | Status: DC | PRN

## 2017-07-25 MED ORDER — VANCOMYCIN HCL IN DEXTROSE 1-5 GM/200ML-% IV SOLN
1000.0000 mg | Freq: Two times a day (BID) | INTRAVENOUS | Status: DC
  Administered 2017-07-25: 1000 mg via INTRAVENOUS
  Filled 2017-07-25 (×2): qty 200

## 2017-07-25 MED ORDER — OXYCODONE-ACETAMINOPHEN 5-325 MG PO TABS: 2.0000 | ORAL_TABLET | ORAL | Status: DC | PRN

## 2017-07-25 MED ORDER — ONDANSETRON HCL 4 MG/2ML IJ SOLN: 4.0000 mg | INTRAMUSCULAR | Status: DC | PRN

## 2017-07-25 MED ORDER — ONDANSETRON HCL 4 MG PO TABS: 4.0000 mg | ORAL_TABLET | ORAL | Status: DC | PRN

## 2017-07-25 MED ORDER — FENTANYL CITRATE (PF) 100 MCG/2ML IJ SOLN
100.0000 ug | Freq: Once | INTRAMUSCULAR | Status: AC
  Administered 2017-07-25: 100 ug via INTRAVENOUS

## 2017-07-25 MED ORDER — OXYTOCIN 40 UNITS IN LACTATED RINGERS INFUSION - SIMPLE MED
2.5000 [IU]/h | INTRAVENOUS | Status: DC
  Filled 2017-07-25: qty 1000

## 2017-07-25 MED ORDER — FENTANYL CITRATE (PF) 100 MCG/2ML IJ SOLN
INTRAMUSCULAR | Status: AC
  Filled 2017-07-25: qty 2

## 2017-07-25 MED ORDER — TERBUTALINE SULFATE 1 MG/ML IJ SOLN: 0.2500 mg | Freq: Once | INTRAMUSCULAR | Status: DC | PRN

## 2017-07-25 MED ORDER — WITCH HAZEL-GLYCERIN EX PADS: 1.0000 "application " | MEDICATED_PAD | CUTANEOUS | Status: DC | PRN

## 2017-07-25 MED ORDER — OXYTOCIN 40 UNITS IN LACTATED RINGERS INFUSION - SIMPLE MED: 1.0000 m[IU]/min | INTRAVENOUS | Status: DC

## 2017-07-25 MED ORDER — LACTATED RINGERS IV SOLN
INTRAVENOUS | Status: DC
  Administered 2017-07-25: 10:00:00 via INTRAVENOUS

## 2017-07-25 MED ORDER — SOD CITRATE-CITRIC ACID 500-334 MG/5ML PO SOLN: 30.0000 mL | ORAL | Status: DC | PRN

## 2017-07-25 MED ORDER — ONDANSETRON HCL 4 MG/2ML IJ SOLN
4.0000 mg | Freq: Four times a day (QID) | INTRAMUSCULAR | Status: DC | PRN
  Administered 2017-07-25: 4 mg via INTRAVENOUS
  Filled 2017-07-25: qty 2

## 2017-07-25 MED ORDER — IBUPROFEN 600 MG PO TABS
600.0000 mg | ORAL_TABLET | Freq: Four times a day (QID) | ORAL | Status: DC
  Administered 2017-07-25 – 2017-07-27 (×7): 600 mg via ORAL
  Filled 2017-07-25 (×6): qty 1

## 2017-07-25 MED ORDER — COCONUT OIL OIL: 1.0000 "application " | TOPICAL_OIL | Status: DC | PRN

## 2017-07-25 MED ORDER — CEFAZOLIN SODIUM-DEXTROSE 1-4 GM/50ML-% IV SOLN: 1.0000 g | Freq: Three times a day (TID) | INTRAVENOUS | Status: DC

## 2017-07-25 MED ORDER — CEFAZOLIN SODIUM-DEXTROSE 2-4 GM/100ML-% IV SOLN: 2.0000 g | Freq: Once | INTRAVENOUS | Status: DC

## 2017-07-25 MED ORDER — OXYTOCIN BOLUS FROM INFUSION
500.0000 mL | Freq: Once | INTRAVENOUS | Status: AC
  Administered 2017-07-25: 500 mL via INTRAVENOUS

## 2017-07-25 NOTE — Anesthesia Pain Management Evaluation Note (Signed)
  CRNA Pain Management Visit Note  Patient: Debbie Hughes, 27 y.o., female  "Hello I am a member of the anesthesia team at [facility]. We have an anesthesia team available at all times to provide care throughout the hospital, including epidural management and anesthesia for C-section. I don't know your plan for the delivery whether it a natural birth, water birth, IV sedation, nitrous supplementation, doula or epidural, but we want to meet your pain goals."   1.Was your pain managed to your expectations on prior hospitalizations?   Yes   2.What is your expectation for pain management during this hospitalization?     IV pain meds  3.How can we help you reach that goal? Nursing interventions.  Record the patient's initial score and the patient's pain goal.   Pain: 6  Pain Goal: 10 [facility] wants you to be able to say your pain was always managed very well.  Thula Stewart 07/25/2017

## 2017-07-25 NOTE — MAU Note (Signed)
Patient presents with contractions since 0430 was 2 cm this past week.

## 2017-07-25 NOTE — H&P (Addendum)
Debbie Hughes is a 27 y.o. female presenting for labor.  [id number] @ 39 W 5 D, LMP 10/20/2008, EDC 07/27/17.  She presented with contractions and she was found to be 5cm dilated in the MAU.    OB History    Gravida  4   Para  1   Term  1   Preterm      AB  2   Living  1     SAB  1   TAB      Ectopic  1   Multiple  0   Live Births  1          Past Medical History:  Diagnosis Date  . Breast mass in female 01/21/06  . Breast nodule 04/26/10   Left  . BV (bacterial vaginosis) 5.10/11  . Candida vaginitis 04/26/10  . Ectopic pregnancy 03/24/2013  . Fibroadenoma   . H/O varicella   . History of chlamydia   . Irregular menstrual cycle 07/28/07  . 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 Vaginal discharge 10/01/07  . Vaginitis    Past Surgical History:  Procedure Laterality Date  . BREAST CYST EXCISION Left 2007  . DILATION AND CURETTAGE OF UTERUS    . LAPAROSCOPY N/A 03/24/2013   Procedure: LAPAROSCOPY OPERATIVE;  Surgeon: Betsy Coder, MD;  Location: [facility];  Service: Gynecology;  Laterality: N/A;  . Left Salpingectomy  03/24/2013   Ectopic Pregnancy  . UNILATERAL SALPINGECTOMY Left 03/24/2013   Procedure: UNILATERAL SALPINGECTOMY;  Surgeon: Betsy Coder, MD;  Location: [facility];  Service: Gynecology;  Laterality: Left;   Family History: family history includes Asthma in her maternal grandmother; Diabetes in her maternal grandmother and paternal grandfather; Hypertension in her maternal grandmother and paternal grandfather. Social History:  reports that she has quit smoking. She has never used smokeless tobacco. She reports that she does not drink alcohol or use drugs.     Maternal Diabetes: No Genetic Screening: Declined Maternal Ultrasounds/Referrals: Abnormal:  Findings:   Other:  07/22/2017: EFW 6lbs 11oz.  BPD 3rd%, HC 2nd%, FL 4th%, AC 23rd%.   Fetal Ultrasounds or other Referrals:  None Maternal Substance Abuse:   No Significant Maternal Medications:  None Significant Maternal Lab Results:  Lab values include: Group B Strep positive Other Comments:  None  ROS History Dilation: 5 Effacement (%): 90 Station: -1 Exam by:: F. Morris, RNC Blood pressure 125/77, pulse 92, temperature 98.2 F (36.8 C), resp. rate 16, height 5\' 5"  (1.651 m), weight 77.1 kg (170 lb), last menstrual period 10/20/2016, unknown if currently breastfeeding.  EFM: 120 BL, mod var, Reactive.  + early decels. TOCO: CTX Q 2 - 3 minutes.   Exam Physical Exam  Constitutional: She is oriented to person, place, and time. She appears well-developed and well-nourished.  HENT:  Head: Normocephalic and atraumatic.  Neck: Normal range of motion.  Cardiovascular: Normal rate.   Respiratory: Effort normal and breath sounds normal.  GI: Soft. Bowel sounds are normal.  Neurological: She is alert and oriented to person, place, and time.  Skin: Skin is warm and dry.  Psychiatric: She has a normal mood and affect. Her behavior is normal.  Genitourinary: Gravid uterus.   Prenatal labs: ABO, Rh: A/Positive/-- (08/14 0000) Antibody: Negative (08/14 0000) Rubella: Immune (08/14 0000) RPR: Nonreactive (08/14 0000)  HBsAg: Negative (08/14 0000)  HIV: Non-reactive (08/14 0000)  GBS: Positive (02/27 0000)   S/p TDAP and flu vaccines in office.  Assessment/Plan: 27 y/o P1 @ 39 weeks 5 days EGA in labor,  Admit to Labor and delivery -GBS positive, with severe allergy to penicillin, give vancomycin as per GBS sensitivities.  -Anticipate NSVD.    Alinda Dooms, MD.  07/25/2017, 9:32 AM

## 2017-07-26 ENCOUNTER — Inpatient Hospital Stay (HOSPITAL_COMMUNITY)
Admission: RE | Admit: 2017-07-26 | Discharge: 2017-07-26 | Disposition: A | Discharge status: Home / Self Care | Payer: BC Managed Care – PPO | Source: Ambulatory Visit | Attending: Obstetrics and Gynecology | Admitting: Obstetrics and Gynecology

## 2017-07-26 LAB — RPR: RPR: NONREACTIVE

## 2017-07-26 LAB — CBC
HCT: 29.4 % — ABNORMAL LOW (ref 36.0–46.0)
Hemoglobin: 9.9 g/dL — ABNORMAL LOW (ref 12.0–15.0)
MCH: 31.7 pg (ref 26.0–34.0)
MCHC: 33.7 g/dL (ref 30.0–36.0)
MCV: 94.2 fL (ref 78.0–100.0)
PLATELETS: 148 10*3/uL — AB (ref 150–400)
RBC: 3.12 MIL/uL — ABNORMAL LOW (ref 3.87–5.11)
RDW: 12.5 % (ref 11.5–15.5)
WBC: 10.9 10*3/uL — ABNORMAL HIGH (ref 4.0–10.5)

## 2017-07-26 NOTE — Progress Notes (Signed)
Parent request formula to supplement breast feeding due to baby continuous cueing.Parents have been informed of small tummy size of newborn, taught hand expression and understands the possible consequences of formula to the health of the infant. The possible consequences shared with patient include 1) Loss of confidence in breastfeeding 2) Engorgement 3) Allergic sensitization of baby(asthma/allergies) and 4) decreased milk supply for mother.After discussion of the above the mother decided to supplement. The tool used to give formula supplement will be bottle.

## 2017-07-26 NOTE — Plan of Care (Signed)
POC discussed with pt, no questions or concerns at this time.

## 2017-07-26 NOTE — Lactation Note (Signed)
This note was copied from a baby's chart. Lactation Consultation Note Baby 20 hrs old. Baby has been BF well per mom. Has good output.  Baby 5.12 lbs has 3% weight loss.  Mom is experienced BF of 8 months to her now 27 yr old. Mom denied difficulty. Mom has everted nipples, easily expressed colostrum.  Discussed supplementing d/t less than 6 lbs. Mom had tried to give formula, baby refused to suck on bottle. Encouraged hand expression and spoon feeding. Reviewed supplementing feeding sheet of amount according to hours of age. Mom shown how to use DEBP & how to disassemble, clean, & reassemble parts. Mom knows to pump q3h for 15-20 min.  Mom encouraged to feed baby 8-12 times/24 hours and with feeding cues. If baby hasn't cued in 3 hrs. Wake baby to feed.  Newborn feeding habits reviewed, encouraged STS, I&O. Mom feels comfortable w/feedings, feels things are going well. Encouraged to call for assistance if needed. [facility] brochure given w/resources, support groups and [facility] services.  Patient Name: Debbie Hughes JOACZ'Y Date: 07/26/2017 Reason for consult: Initial assessment;Infant < 6lbs   Maternal Data Has patient been taught Hand Expression?: Yes Does the patient have breastfeeding experience prior to this delivery?: Yes  Feeding Feeding Type: Breast Fed Length of feed: 10 min(still BF)  LATCH Score Latch: Grasps breast easily, tongue down, lips flanged, rhythmical sucking.  Audible Swallowing: Spontaneous and intermittent  Type of Nipple: Everted at rest and after stimulation  Comfort (Breast/Nipple): Soft / non-tender  Hold (Positioning): No assistance needed to correctly position infant at breast.  LATCH Score: 10  Interventions Interventions: Breast feeding basics reviewed;Support pillows;Position options;Skin to skin;Breast massage;Hand express;DEBP;Breast compression  Lactation Tools Discussed/Used Tools: Pump Breast pump type: Double-Electric Breast Pump WIC  Program: No Pump Review: Setup, frequency, and cleaning;Milk Storage Initiated by:: Allayne Stack RN IBCLC Date initiated:: 07/26/17   Consult Status Consult Status: Follow-up Date: 07/27/17 Follow-up type: In-patient    Theodoro Kalata 07/26/2017, 6:48 AM

## 2017-07-26 NOTE — Progress Notes (Signed)
Post Partum Day 1 Subjective: no complaints  Objective: Blood pressure 126/68, pulse (!) 52, temperature 98.3 F (36.8 C), temperature source Oral, resp. rate 18, height 5\' 5"  (1.651 m), weight 165 lb (74.8 kg), last menstrual period 10/20/2016, unknown if currently breastfeeding.  Physical Exam:  General: alert, cooperative and appears stated age Lochia: appropriate Uterine Fundus: firm Incision:  DVT Evaluation: No evidence of DVT seen on physical exam.  Recent Labs    07/25/17 0907 07/26/17 0513  HGB 12.8 9.9*  HCT 36.2 29.4*    Assessment/Plan: Plan for discharge tomorrow and Breastfeeding   LOS: 1 day   [place] 07/26/2017, 7:37 AM

## 2017-07-27 MED ORDER — IBUPROFEN 600 MG PO TABS: 600.0000 mg | ORAL_TABLET | Freq: Four times a day (QID) | ORAL | 0 refills | Status: DC

## 2017-07-27 NOTE — Lactation Note (Signed)
Lactation Consultation Note  Patient Name: Debbie Hughes Date: 07/27/2017 Reason for consult: Follow-up assessment;Infant < 6lbs   Baby < 6 lbs. Baby latched in cradle hold.  Encouraged breast compression. Mom encouraged to feed baby 8-12 times/24 hours and with feeding cues at least q 3 hours.  Reviewed engorgement care and monitoring voids/stools. Recommend mother post pump 4-6 times per day for 10-20 min with DEBP at home. Give baby back volume pumped at the next feeding. Mother denies questions or concerns. Pacifier use not recommended at this time.        Maternal Data Has patient been taught Hand Expression?: Yes  Feeding Feeding Type: Breast Fed  LATCH Score Latch: Grasps breast easily, tongue down, lips flanged, rhythmical sucking.  Audible Swallowing: A few with stimulation  Type of Nipple: Everted at rest and after stimulation  Comfort (Breast/Nipple): Soft / non-tender  Hold (Positioning): No assistance needed to correctly position infant at breast.  LATCH Score: 9  Interventions Interventions: Breast feeding basics reviewed  Lactation Tools Discussed/Used     Consult Status Consult Status: Complete    Carlye Grippe 07/27/2017, 11:25 AM

## 2017-07-27 NOTE — Discharge Summary (Signed)
OB Discharge Summary     Patient Name: Debbie Hughes DOB: Nov 17, 1990 MRN: [id number]  Date of admission: 07/25/2017 Delivering MD: Waymon Amato   Date of discharge: 07/27/2017  Admitting diagnosis: 39WKS,LABOR Intrauterine pregnancy: [redacted]w[redacted]d     Secondary diagnosis:  Active Problems:   Normal labor   NSVD (normal spontaneous vaginal delivery)  Additional problems: None     Discharge diagnosis: Term Pregnancy Delivered                                                                                                Post partum procedures:None  Augmentation: None  Complications: None  Hospital course:  Onset of Labor With Vaginal Delivery     27 y.o. yo [id number] at [redacted]w[redacted]d was admitted in Active Labor on 07/25/2017. Patient had an uncomplicated labor course as follows:  Membrane Rupture Time/Date: 10:59 AM ,07/25/2017   Intrapartum Procedures: Episiotomy: None [1]                                         Lacerations:  1st degree [2]  Patient had a delivery of a Viable infant. 07/25/2017  Information for the patient's newborn:  Debbie, Adami Girl Hughes [[id number]]  Delivery Method: Vag-Spont    Pateint had an uncomplicated postpartum course.  She is ambulating, tolerating a regular diet, passing flatus, and urinating well. Patient is discharged home in stable condition on 07/27/17.   Physical exam  Vitals:   07/25/17 1754 07/25/17 2320 07/26/17 0500 07/27/17 0519  BP: 132/71 133/78 126/68 115/67  Pulse: (!) 122 (!) 102 (!) 52 83  Resp: 19 18 18 18   Temp: 98.8 F (37.1 C) 98.9 F (37.2 C) 98.3 F (36.8 C) 98.2 F (36.8 C)  TempSrc: Oral Oral Oral Oral  Weight:      Height:       General: alert, cooperative and no distress Lochia: appropriate Uterine Fundus: firm Incision: N/A DVT Evaluation: No evidence of DVT seen on physical exam. Negative Homan's sign. Labs: Lab Results  Component Value Date   WBC 10.9 (H) 07/26/2017   HGB 9.9 (L) 07/26/2017   HCT 29.4 (L) 07/26/2017    MCV 94.2 07/26/2017   PLT 148 (L) 07/26/2017   CMP Latest Ref Rng & Units 11/22/2016  Glucose 65 - 99 mg/dL 266(H)  BUN 6 - 20 mg/dL 13  Creatinine 0.44 - 1.00 mg/dL 0.87  Sodium 135 - 145 mmol/L 133(L)  Potassium 3.5 - 5.1 mmol/L 3.7  Chloride 101 - 111 mmol/L 104  CO2 22 - 32 mmol/L 18(L)  Calcium 8.9 - 10.3 mg/dL 9.2  Total Protein 6.5 - 8.1 g/dL 6.8  Total Bilirubin 0.3 - 1.2 mg/dL 0.9  Alkaline Phos 38 - 126 U/L 41  AST 15 - 41 U/L 26  ALT 14 - 54 U/L 15    Discharge instruction: per After Visit Summary and "Baby and Me Booklet".  After visit meds:  Per med rec  Diet: routine diet  Activity: Advance as tolerated. Pelvic  rest for 6 weeks.   Outpatient follow up:6 weeks Follow up Appt:No future appointments. Follow up Visit:No follow-ups on file.  Postpartum contraception: Progesterone only pills  Newborn Data: Live born female  Birth Weight: 5 lb 15.6 oz (2710 g) APGAR: 9, 9  Newborn Delivery   Birth date/time:  07/25/2017 11:20:00 Delivery type:  Vaginal, Spontaneous     Baby Feeding: Breast Disposition:home with mother   07/27/2017 Starla Link, CNM

## 2018-02-18 ENCOUNTER — Other Ambulatory Visit: Payer: Self-pay

## 2018-02-18 ENCOUNTER — Encounter (HOSPITAL_COMMUNITY): Payer: Self-pay | Admitting: *Deleted

## 2018-02-18 ENCOUNTER — Inpatient Hospital Stay (HOSPITAL_COMMUNITY)
Admission: AD | Admit: 2018-02-18 | Discharge: 2018-02-18 | Disposition: A | Discharge status: Home / Self Care | Payer: BC Managed Care – PPO | Source: Ambulatory Visit | Attending: Obstetrics & Gynecology | Admitting: Obstetrics & Gynecology

## 2018-02-18 DIAGNOSIS — R112 Nausea with vomiting, unspecified: Secondary | ICD-10-CM | POA: Diagnosis not present

## 2018-02-18 DIAGNOSIS — O2690 Pregnancy related conditions, unspecified, unspecified trimester: Secondary | ICD-10-CM | POA: Insufficient documentation

## 2018-02-18 DIAGNOSIS — O219 Vomiting of pregnancy, unspecified: Secondary | ICD-10-CM | POA: Diagnosis not present

## 2018-02-18 DIAGNOSIS — Z87891 Personal history of nicotine dependence: Secondary | ICD-10-CM | POA: Diagnosis not present

## 2018-02-18 LAB — CBC
HCT: 35.1 % — ABNORMAL LOW (ref 36.0–46.0)
Hemoglobin: 12.2 g/dL (ref 12.0–15.0)
MCH: 32.1 pg (ref 26.0–34.0)
MCHC: 34.8 g/dL (ref 30.0–36.0)
MCV: 92.4 fL (ref 80.0–100.0)
NRBC: 0 % (ref 0.0–0.2)
Platelets: 221 10*3/uL (ref 150–400)
RBC: 3.8 MIL/uL — ABNORMAL LOW (ref 3.87–5.11)
RDW: 11.8 % (ref 11.5–15.5)
WBC: 7.9 10*3/uL (ref 4.0–10.5)

## 2018-02-18 LAB — URINALYSIS, ROUTINE W REFLEX MICROSCOPIC
BILIRUBIN URINE: NEGATIVE
Glucose, UA: NEGATIVE mg/dL
Hgb urine dipstick: NEGATIVE
Ketones, ur: 80 mg/dL — AB
LEUKOCYTES UA: NEGATIVE
NITRITE: NEGATIVE
Protein, ur: NEGATIVE mg/dL
Specific Gravity, Urine: 1.005 (ref 1.005–1.030)
pH: 7 (ref 5.0–8.0)

## 2018-02-18 LAB — COMPREHENSIVE METABOLIC PANEL
ALBUMIN: 3.9 g/dL (ref 3.5–5.0)
ALT: 9 U/L (ref 0–44)
ANION GAP: 11 (ref 5–15)
AST: 16 U/L (ref 15–41)
Alkaline Phosphatase: 50 U/L (ref 38–126)
BUN: 6 mg/dL (ref 6–20)
CO2: 20 mmol/L — AB (ref 22–32)
Calcium: 9 mg/dL (ref 8.9–10.3)
Chloride: 103 mmol/L (ref 98–111)
Creatinine, Ser: 0.41 mg/dL — ABNORMAL LOW (ref 0.44–1.00)
GFR calc Af Amer: >60 mL/min (ref >60)
GFR calc non Af Amer: >60 mL/min (ref >60)
GLUCOSE: 82 mg/dL (ref 70–99)
POTASSIUM: 3.4 mmol/L — AB (ref 3.5–5.1)
SODIUM: 134 mmol/L — AB (ref 135–145)
Total Bilirubin: 0.8 mg/dL (ref 0.3–1.2)
Total Protein: 6.6 g/dL (ref 6.5–8.1)

## 2018-02-18 LAB — TSH: TSH: 0.586 u[IU]/mL (ref 0.350–4.500)

## 2018-02-18 LAB — POCT PREGNANCY, URINE: PREG TEST UR: POSITIVE — AB

## 2018-02-18 MED ORDER — LACTATED RINGERS IV BOLUS
1000.0000 mL | Freq: Once | INTRAVENOUS | Status: AC
  Administered 2018-02-18: 1000 mL via INTRAVENOUS

## 2018-02-18 MED ORDER — FAMOTIDINE IN NACL 20-0.9 MG/50ML-% IV SOLN
20.0000 mg | Freq: Once | INTRAVENOUS | Status: AC
  Administered 2018-02-18: 20 mg via INTRAVENOUS
  Filled 2018-02-18: qty 50

## 2018-02-18 MED ORDER — ONDANSETRON 8 MG PO TBDP: 8.0000 mg | ORAL_TABLET | Freq: Three times a day (TID) | ORAL | 0 refills | Status: DC | PRN

## 2018-02-18 MED ORDER — DEXTROSE 5 % IN LACTATED RINGERS IV BOLUS
1000.0000 mL | Freq: Once | INTRAVENOUS | Status: AC
  Administered 2018-02-18: 1000 mL via INTRAVENOUS

## 2018-02-18 MED ORDER — PROMETHAZINE HCL 25 MG PO TABS: 25.0000 mg | ORAL_TABLET | Freq: Every evening | ORAL | 2 refills | Status: DC | PRN

## 2018-02-18 MED ORDER — PROMETHAZINE HCL 25 MG/ML IJ SOLN
25.0000 mg | Freq: Four times a day (QID) | INTRAMUSCULAR | Status: DC | PRN
  Administered 2018-02-18: 25 mg via INTRAVENOUS
  Filled 2018-02-18: qty 1

## 2018-02-18 MED ORDER — DOXYLAMINE-PYRIDOXINE 10-10 MG PO TBEC: DELAYED_RELEASE_TABLET | ORAL | 5 refills | Status: DC

## 2018-02-18 MED ORDER — METOCLOPRAMIDE HCL 10 MG PO TABS: 10.0000 mg | ORAL_TABLET | Freq: Three times a day (TID) | ORAL | 2 refills | Status: DC

## 2018-02-18 NOTE — MAU Note (Signed)
Haven't been able to keep anything down for about 36hrs now.  Is taking promethazine about every 6 hrs since Friday, initially was working.  Hx of hyperemesis with first 2 pregnancies.  Starting to feel light headed and kind of dehydrated. Has not been seen in office yet.

## 2018-02-18 NOTE — MAU Provider Note (Signed)
Chief Complaint: Emesis and Possible Pregnancy   First Provider Initiated Contact with Patient 02/18/18 1738      SUBJECTIVE HPI: Debbie Hughes is a 27 y.o. [id number] who presents to maternity admissions reporting nausea and vomiting x 2 weeks. She is taking phenergan 25 mg every 6 hours and this was helping but 2 days ago her symptoms became worse. She reports vomiting 4-5 times in 24 hours.  She had hyperemesis in her 2 previous pregnancies and wants to get treatment before it gets too bad. There are no other symptoms. She denies any lower abdominal pain or vaginal bleeding. She has not yet started care but plans to see [facility] like with her previous pregnancies.  She denies vaginal bleeding, vaginal itching/burning, urinary symptoms, h/a, dizziness, n/v, or fever/chills.     HPI  Past Medical History:  Diagnosis Date  . Breast mass in female 01/21/06  . Breast nodule 04/26/10   Left  . BV (bacterial vaginosis) 5.10/11  . Candida vaginitis 04/26/10  . Ectopic pregnancy 03/24/2013  . Fibroadenoma   . H/O varicella   . History of chlamydia   . Irregular menstrual cycle 07/28/07  . 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 Vaginal discharge 10/01/07  . Vaginitis    Past Surgical History:  Procedure Laterality Date  . BREAST CYST EXCISION Left 2007  . DILATION AND CURETTAGE OF UTERUS    . LAPAROSCOPY N/A 03/24/2013   Procedure: LAPAROSCOPY OPERATIVE;  Surgeon: Betsy Coder, MD;  Location: [facility];  Service: Gynecology;  Laterality: N/A;  . Left Salpingectomy  03/24/2013   Ectopic Pregnancy  . UNILATERAL SALPINGECTOMY Left 03/24/2013   Procedure: UNILATERAL SALPINGECTOMY;  Surgeon: Betsy Coder, MD;  Location: [facility];  Service: Gynecology;  Laterality: Left;   Social History   Socioeconomic History  . Marital status: Single    Spouse name: Not on file  . Number of children: Not on file  . Years of education: Not on file  . Highest education  level: Not on file  Occupational History  . Not on file  Social Needs  . Financial resource strain: Not on file  . Food insecurity:    Worry: Not on file    Inability: Not on file  . Transportation needs:    Medical: Not on file    Non-medical: Not on file  Tobacco Use  . Smoking status: Former Research scientist (life sciences)  . Smokeless tobacco: Never Used  Substance and Sexual Activity  . Alcohol use: No  . Drug use: No  . Sexual activity: Yes  Lifestyle  . Physical activity:    Days per week: Not on file    Minutes per session: Not on file  . Stress: Not on file  Relationships  . Social connections:    Talks on phone: Not on file    Gets together: Not on file    Attends religious service: Not on file    Active member of club or organization: Not on file    Attends meetings of clubs or organizations: Not on file    Relationship status: Not on file  . Intimate partner violence:    Fear of current or ex partner: Not on file    Emotionally abused: Not on file    Physically abused: Not on file    Forced sexual activity: Not on file  Other Topics Concern  . Not on file  Social History Narrative  . Not on file  No current facility-administered medications on file prior to encounter.    Current Outpatient Medications on File Prior to Encounter  Medication Sig Dispense Refill  . Prenatal Vit-Fe Fumarate-FA (PRENATAL MULTIVITAMIN) TABS tablet Take 1 tablet by mouth daily at 12 noon.     Allergies  Allergen Reactions  . Penicillins Rash    Has patient had a PCN reaction causing immediate rash, facial/tongue/throat swelling, SOB or lightheadedness with hypotension: yes Has patient had a PCN reaction causing severe rash involving mucus membranes or skin necrosis: no Has patient had a PCN reaction that required hospitalization no Has patient had a PCN reaction occurring within the last 10 years: yes If all of the above answers are "NO", then may proceed with Cephalosporin use.     ROS:   Review of Systems  Constitutional: Negative for chills, fatigue and fever.  Respiratory: Negative for shortness of breath.   Cardiovascular: Negative for chest pain.  Gastrointestinal: Positive for nausea and vomiting. Negative for constipation and diarrhea.  Genitourinary: Negative for difficulty urinating, dysuria, flank pain, pelvic pain, vaginal bleeding, vaginal discharge and vaginal pain.  Neurological: Negative for dizziness and headaches.  Psychiatric/Behavioral: Negative.      I have reviewed patient's Past Medical Hx, Surgical Hx, Family Hx, Social Hx, medications and allergies.   Physical Exam   Patient Vitals for the past 24 hrs:  BP Temp Temp src Pulse Resp SpO2 Weight  02/18/18 2118 129/85 - - 95 17 - -  02/18/18 1704 125/73 98.2 F (36.8 C) Oral 89 17 100 % 60.9 kg   Constitutional: Well-developed, well-nourished female in no acute distress.  Cardiovascular: normal rate Respiratory: normal effort GI: Abd soft, non-tender. Pos BS x 4 MS: Extremities nontender, no edema, normal ROM Neurologic: Alert and oriented x 4.  GU: Neg CVAT.  LAB RESULTS Results for orders placed or performed during the hospital encounter of 02/18/18 (from the past 24 hour(s))  Urinalysis, Routine w reflex microscopic     Status: Abnormal   Collection Time: 02/18/18  5:18 PM  Result Value Ref Range   Color, Urine STRAW (A) YELLOW   APPearance CLEAR CLEAR   Specific Gravity, Urine 1.005 1.005 - 1.030   pH 7.0 5.0 - 8.0   Glucose, UA NEGATIVE NEGATIVE mg/dL   Hgb urine dipstick NEGATIVE NEGATIVE   Bilirubin Urine NEGATIVE NEGATIVE   Ketones, ur 80 (A) NEGATIVE mg/dL   Protein, ur NEGATIVE NEGATIVE mg/dL   Nitrite NEGATIVE NEGATIVE   Leukocytes, UA NEGATIVE NEGATIVE  Pregnancy, urine POC     Status: Abnormal   Collection Time: 02/18/18  5:23 PM  Result Value Ref Range   Preg Test, Ur POSITIVE (A) NEGATIVE  CBC     Status: Abnormal   Collection Time: 02/18/18  6:18 PM  Result Value  Ref Range   WBC 7.9 4.0 - 10.5 K/uL   RBC 3.80 (L) 3.87 - 5.11 MIL/uL   Hemoglobin 12.2 12.0 - 15.0 g/dL   HCT 35.1 (L) 36.0 - 46.0 %   MCV 92.4 80.0 - 100.0 fL   MCH 32.1 26.0 - 34.0 pg   MCHC 34.8 30.0 - 36.0 g/dL   RDW 11.8 11.5 - 15.5 %   Platelets 221 150 - 400 K/uL   nRBC 0.0 0.0 - 0.2 %  Comprehensive metabolic panel     Status: Abnormal   Collection Time: 02/18/18  6:18 PM  Result Value Ref Range   Sodium 134 (L) 135 - 145 mmol/L   Potassium 3.4 (  L) 3.5 - 5.1 mmol/L   Chloride 103 98 - 111 mmol/L   CO2 20 (L) 22 - 32 mmol/L   Glucose, Bld 82 70 - 99 mg/dL   BUN 6 6 - 20 mg/dL   Creatinine, Ser 0.41 (L) 0.44 - 1.00 mg/dL   Calcium 9.0 8.9 - 10.3 mg/dL   Total Protein 6.6 6.5 - 8.1 g/dL   Albumin 3.9 3.5 - 5.0 g/dL   AST 16 15 - 41 U/L   ALT 9 0 - 44 U/L   Alkaline Phosphatase 50 38 - 126 U/L   Total Bilirubin 0.8 0.3 - 1.2 mg/dL   GFR calc non Af Amer >60 >60 mL/min   GFR calc Af Amer >60 >60 mL/min   Anion gap 11 5 - 15  TSH     Status: None   Collection Time: 02/18/18  6:18 PM  Result Value Ref Range   TSH 0.586 0.350 - 4.500 uIU/mL    --/--/A POS (03/22 5027)  IMAGING No results found.  MAU Management/MDM: Ordered labs and reviewed results.  CBC, CMP wnl.  UA with evidence of dehydration so IV fluids given with D5LR x 1000 ml, LR x 1000 ml. Phenergan 25 mg IV given.  Pt improved but still had some nausea. Offered Zofran.  Discussed with the patient the risk of Zofran use in the first trimester of pregnancy. Risks of use in the first trimester include birth defects in babies, specifically cleft lip/palate.  Pt declines Zofran today.  D/C home with Rx for Diclegis but may need prior authorization with her insurance.  Also sent Rx for Reglan 10 mg TID, continue Phenergan Q HS.  Return if symptoms of dehydration.   Pt discharged with strict return precautions.  ASSESSMENT 1. Nausea and vomiting during pregnancy prior to [redacted] weeks gestation     PLAN Discharge  home Allergies as of 02/18/2018      Reactions   Penicillins Rash   Has patient had a PCN reaction causing immediate rash, facial/tongue/throat swelling, SOB or lightheadedness with hypotension: yes Has patient had a PCN reaction causing severe rash involving mucus membranes or skin necrosis: no Has patient had a PCN reaction that required hospitalization no Has patient had a PCN reaction occurring within the last 10 years: yes If all of the above answers are "NO", then may proceed with Cephalosporin use.      Medication List    STOP taking these medications   ibuprofen 600 MG tablet Commonly known as:  ADVIL,MOTRIN     TAKE these medications   Doxylamine-Pyridoxine 10-10 MG Tbec Take 2 tabs at bedtime. If needed, add another tab in the morning. If needed, add another tab in the afternoon, up to 4 tabs/day.   metoCLOPramide 10 MG tablet Commonly known as:  REGLAN Take 1 tablet (10 mg total) by mouth 3 (three) times daily before meals.   ondansetron 8 MG disintegrating tablet Commonly known as:  ZOFRAN-ODT Take 1 tablet (8 mg total) by mouth every 8 (eight) hours as needed for nausea or vomiting.   prenatal multivitamin Tabs tablet Take 1 tablet by mouth daily at 12 noon.   promethazine 25 MG tablet Commonly known as:  PHENERGAN Take 1 tablet (25 mg total) by mouth at bedtime as needed.      Follow-up [facility] Follow up.   Specialty:  Obstetrics and Gynecology Why:  Return to MAU as needed for emergencies Contact information: [address]  [place] [id number] [place] Certified Nurse-Midwife 02/19/2018  12:26 PM

## 2018-05-06 NOTE — L&D Delivery Note (Signed)
Delivery Note At 11:02 PM a viable female was delivered preciptiously via Vaginal, Spontaneous (Presentation: ROA).  APGAR: 8,9; weight pending.  Placenta status: delivered spontaneously and completely.  Cord: 3v with the following complications: nuchal x1, loose and easily reducible.   Anesthesia:  None  Episiotomy: None Lacerations:  Small vaginal floor abrasion, hemostatic  Suture Repair: n/a Est. Blood Loss (mL):  23ml  Mom to postpartum.  Baby to Couplet care / Skin to Skin.  [name] 08/25/2018, 11:45 PM

## 2018-08-16 ENCOUNTER — Encounter (HOSPITAL_COMMUNITY): Payer: Self-pay | Admitting: *Deleted

## 2018-08-16 ENCOUNTER — Observation Stay (HOSPITAL_COMMUNITY)
Admission: AD | Admit: 2018-08-16 | Discharge: 2018-08-16 | Disposition: A | Discharge status: Home / Self Care | Payer: BC Managed Care – PPO | Attending: Obstetrics and Gynecology | Admitting: Obstetrics and Gynecology

## 2018-08-16 ENCOUNTER — Other Ambulatory Visit: Payer: Self-pay

## 2018-08-16 DIAGNOSIS — Z79899 Other long term (current) drug therapy: Secondary | ICD-10-CM | POA: Insufficient documentation

## 2018-08-16 DIAGNOSIS — O26893 Other specified pregnancy related conditions, third trimester: Secondary | ICD-10-CM | POA: Diagnosis not present

## 2018-08-16 DIAGNOSIS — O4700 False labor before 37 completed weeks of gestation, unspecified trimester: Secondary | ICD-10-CM | POA: Diagnosis present

## 2018-08-16 DIAGNOSIS — Z3A35 35 weeks gestation of pregnancy: Secondary | ICD-10-CM | POA: Diagnosis not present

## 2018-08-16 DIAGNOSIS — O210 Mild hyperemesis gravidarum: Principal | ICD-10-CM | POA: Insufficient documentation

## 2018-08-16 DIAGNOSIS — Z87891 Personal history of nicotine dependence: Secondary | ICD-10-CM | POA: Diagnosis not present

## 2018-08-16 DIAGNOSIS — O212 Late vomiting of pregnancy: Secondary | ICD-10-CM | POA: Diagnosis present

## 2018-08-16 DIAGNOSIS — R109 Unspecified abdominal pain: Secondary | ICD-10-CM | POA: Diagnosis not present

## 2018-08-16 DIAGNOSIS — Z880 Allergy status to penicillin: Secondary | ICD-10-CM | POA: Diagnosis not present

## 2018-08-16 DIAGNOSIS — O219 Vomiting of pregnancy, unspecified: Secondary | ICD-10-CM | POA: Diagnosis not present

## 2018-08-16 DIAGNOSIS — O479 False labor, unspecified: Secondary | ICD-10-CM | POA: Diagnosis present

## 2018-08-16 DIAGNOSIS — R1110 Vomiting, unspecified: Secondary | ICD-10-CM | POA: Diagnosis present

## 2018-08-16 LAB — COMPREHENSIVE METABOLIC PANEL
ALT: 15 U/L (ref 0–44)
AST: 28 U/L (ref 15–41)
Albumin: 3 g/dL — ABNORMAL LOW (ref 3.5–5.0)
Alkaline Phosphatase: 117 U/L (ref 38–126)
Anion gap: 13 (ref 5–15)
BUN: 5 mg/dL — ABNORMAL LOW (ref 6–20)
CO2: 19 mmol/L — ABNORMAL LOW (ref 22–32)
Calcium: 8.8 mg/dL — ABNORMAL LOW (ref 8.9–10.3)
Chloride: 103 mmol/L (ref 98–111)
Creatinine, Ser: 0.53 mg/dL (ref 0.44–1.00)
GFR calc Af Amer: >60 mL/min (ref >60)
GFR calc non Af Amer: >60 mL/min (ref >60)
Glucose, Bld: 92 mg/dL (ref 70–99)
Potassium: 3 mmol/L — ABNORMAL LOW (ref 3.5–5.1)
Sodium: 135 mmol/L (ref 135–145)
Total Bilirubin: 0.6 mg/dL (ref 0.3–1.2)
Total Protein: 6.3 g/dL — ABNORMAL LOW (ref 6.5–8.1)

## 2018-08-16 LAB — PROTEIN / CREATININE RATIO, URINE
Creatinine, Urine: 46.32 mg/dL
Protein Creatinine Ratio: 0.15 mg/mg{Cre} (ref 0.00–0.15)
Total Protein, Urine: 7 mg/dL

## 2018-08-16 LAB — CBC WITH DIFFERENTIAL/PLATELET
Abs Immature Granulocytes: 0.06 10*3/uL (ref 0.00–0.07)
Basophils Absolute: 0 10*3/uL (ref 0.0–0.1)
Basophils Relative: 0 %
Eosinophils Absolute: 0 10*3/uL (ref 0.0–0.5)
Eosinophils Relative: 0 %
HCT: 34.8 % — ABNORMAL LOW (ref 36.0–46.0)
Hemoglobin: 11.6 g/dL — ABNORMAL LOW (ref 12.0–15.0)
Immature Granulocytes: 0 %
Lymphocytes Relative: 6 %
Lymphs Abs: 0.9 10*3/uL (ref 0.7–4.0)
MCH: 31.9 pg (ref 26.0–34.0)
MCHC: 33.3 g/dL (ref 30.0–36.0)
MCV: 95.6 fL (ref 80.0–100.0)
Monocytes Absolute: 0.2 10*3/uL (ref 0.1–1.0)
Monocytes Relative: 2 %
Neutro Abs: 13.4 10*3/uL — ABNORMAL HIGH (ref 1.7–7.7)
Neutrophils Relative %: 92 %
Platelets: 232 10*3/uL (ref 150–400)
RBC: 3.64 MIL/uL — ABNORMAL LOW (ref 3.87–5.11)
RDW: 11.9 % (ref 11.5–15.5)
WBC: 14.7 10*3/uL — ABNORMAL HIGH (ref 4.0–10.5)
nRBC: 0 % (ref 0.0–0.2)

## 2018-08-16 LAB — AMYLASE: Amylase: 90 U/L (ref 28–100)

## 2018-08-16 LAB — URINALYSIS, ROUTINE W REFLEX MICROSCOPIC
Bacteria, UA: NONE SEEN
Bilirubin Urine: NEGATIVE
Glucose, UA: 50 mg/dL — AB
Hgb urine dipstick: NEGATIVE
Ketones, ur: 80 mg/dL — AB
Leukocytes,Ua: NEGATIVE
Nitrite: NEGATIVE
Protein, ur: 100 mg/dL — AB
Specific Gravity, Urine: 1.016 (ref 1.005–1.030)
pH: 9 — ABNORMAL HIGH (ref 5.0–8.0)

## 2018-08-16 LAB — TYPE AND SCREEN
ABO/RH(D): A POS
Antibody Screen: NEGATIVE

## 2018-08-16 LAB — GROUP B STREP BY PCR: Group B strep by PCR: NEGATIVE

## 2018-08-16 LAB — LIPASE, BLOOD: Lipase: 30 U/L (ref 11–51)

## 2018-08-16 MED ORDER — ACETAMINOPHEN 325 MG PO TABS: 650.0000 mg | ORAL_TABLET | ORAL | Status: DC | PRN

## 2018-08-16 MED ORDER — FENTANYL CITRATE (PF) 100 MCG/2ML IJ SOLN
50.0000 ug | Freq: Once | INTRAMUSCULAR | Status: AC
  Administered 2018-08-16: 11:00:00 50 ug via INTRAVENOUS
  Filled 2018-08-16: qty 2

## 2018-08-16 MED ORDER — ONDANSETRON HCL 4 MG/2ML IJ SOLN
4.0000 mg | Freq: Once | INTRAMUSCULAR | Status: AC
  Administered 2018-08-16: 07:00:00 4 mg via INTRAVENOUS
  Filled 2018-08-16: qty 2

## 2018-08-16 MED ORDER — FENTANYL CITRATE (PF) 100 MCG/2ML IJ SOLN
50.0000 ug | Freq: Once | INTRAMUSCULAR | Status: AC
  Administered 2018-08-16: 07:00:00 50 ug via INTRAVENOUS
  Filled 2018-08-16: qty 2

## 2018-08-16 MED ORDER — PROMETHAZINE HCL 25 MG/ML IJ SOLN
25.0000 mg | Freq: Three times a day (TID) | INTRAMUSCULAR | Status: DC | PRN
  Administered 2018-08-16: 14:00:00 25 mg via INTRAVENOUS
  Filled 2018-08-16: qty 1

## 2018-08-16 MED ORDER — PRENATAL MULTIVITAMIN CH
1.0000 | ORAL_TABLET | Freq: Every day | ORAL | Status: DC
  Filled 2018-08-16: qty 1

## 2018-08-16 MED ORDER — FAMOTIDINE 20 MG IN NS 100 ML IVPB
20.0000 mg | Freq: Once | INTRAVENOUS | Status: AC
  Administered 2018-08-16: 20 mg via INTRAVENOUS
  Filled 2018-08-16: qty 100

## 2018-08-16 MED ORDER — ONDANSETRON HCL 4 MG/2ML IJ SOLN: 4.0000 mg | Freq: Four times a day (QID) | INTRAMUSCULAR | Status: DC | PRN

## 2018-08-16 MED ORDER — PROMETHAZINE HCL 25 MG/ML IJ SOLN
25.0000 mg | Freq: Once | INTRAVENOUS | Status: AC
  Administered 2018-08-16: 07:00:00 25 mg via INTRAVENOUS
  Filled 2018-08-16: qty 1

## 2018-08-16 MED ORDER — DOCUSATE SODIUM 100 MG PO CAPS
100.0000 mg | ORAL_CAPSULE | Freq: Every day | ORAL | Status: DC
  Filled 2018-08-16: qty 1

## 2018-08-16 MED ORDER — MORPHINE SULFATE (PF) 4 MG/ML IV SOLN
4.0000 mg | Freq: Once | INTRAVENOUS | Status: AC
  Administered 2018-08-16: 14:00:00 4 mg via INTRAVENOUS
  Filled 2018-08-16: qty 1

## 2018-08-16 MED ORDER — LACTATED RINGERS IV BOLUS
1000.0000 mL | Freq: Once | INTRAVENOUS | Status: AC
  Administered 2018-08-16: 07:00:00 1000 mL via INTRAVENOUS

## 2018-08-16 MED ORDER — LACTATED RINGERS IV SOLN
INTRAVENOUS | Status: DC
  Administered 2018-08-16 (×2): via INTRAVENOUS

## 2018-08-16 MED ORDER — PROMETHAZINE HCL 25 MG/ML IJ SOLN: 12.5000 mg | Freq: Three times a day (TID) | INTRAMUSCULAR | Status: DC | PRN

## 2018-08-16 MED ORDER — CALCIUM CARBONATE ANTACID 500 MG PO CHEW: 2.0000 | CHEWABLE_TABLET | ORAL | Status: DC | PRN

## 2018-08-16 MED ORDER — ZOLPIDEM TARTRATE 5 MG PO TABS: 5.0000 mg | ORAL_TABLET | Freq: Every evening | ORAL | Status: DC | PRN

## 2018-08-16 MED ORDER — NIFEDIPINE 10 MG PO CAPS
10.0000 mg | ORAL_CAPSULE | ORAL | Status: DC | PRN
  Administered 2018-08-16: 12:00:00 10 mg via ORAL
  Filled 2018-08-16: qty 1

## 2018-08-16 MED ORDER — DICYCLOMINE HCL 10 MG/ML IM SOLN
20.0000 mg | Freq: Three times a day (TID) | INTRAMUSCULAR | Status: DC | PRN
  Administered 2018-08-16: 13:00:00 20 mg via INTRAMUSCULAR
  Filled 2018-08-16 (×2): qty 2

## 2018-08-16 NOTE — Progress Notes (Signed)
Awaiting to print DC instructions.  Reviewed DC instructions with pt.  Make appt this week with [facility] for followup. IV D/C. Pt is keeping crackers and juice down without NV.

## 2018-08-16 NOTE — MAU Provider Note (Addendum)
+Chief Complaint:  Emesis During Pregnancy and Abdominal Pain   First Provider Initiated Contact with Patient 08/16/18 [phone]     HPI: Debbie Hughes is a 28 y.o. [id number] at [redacted]w[redacted]d who presents to maternity admissions reporting constant nausea and vomiting since 2 AM and constant low abdominal pain that she attributes to the incessant vomiting.  Denies upper abdominal pain or history of gallbladder problems. Has not taken anything for the nausea and vomiting.  Associated signs and symptoms: Negative for fever, chills, diarrhea, vaginal bleeding, leaking of fluid.  Unaware of contractions.  Good fetal movement.   Pregnancy Course: Gets prenatal care at [place].  Complicated by hyperemesis.  None in third trimester.  Past Medical History:  Diagnosis Date  . Breast mass in female 01/21/06  . Breast nodule 04/26/10   Left  . BV (bacterial vaginosis) 5.10/11  . Candida vaginitis 04/26/10  . Ectopic pregnancy 03/24/2013  . Fibroadenoma   . H/O varicella   . History of chlamydia   . Irregular menstrual cycle 07/28/07  . 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 Vaginal discharge 10/01/07  . Vaginitis    OB History  Gravida Para Term Preterm AB Living  5 2 2   2 2   SAB TAB Ectopic Multiple Live Births  1   1 0 2    # Outcome Date GA Lbr Len/2nd Weight Sex Delivery Anes PTL Lv  5 Current           4 Term 07/25/17 [redacted]w[redacted]d  2710 g F Vag-Spont None  LIV  3 Term 04/29/14 [redacted]w[redacted]d 03:35 / 00:15 2375 g F Vag-Spont None  LIV  2 SAB           1 Ectopic         DEC   Past Surgical History:  Procedure Laterality Date  . BREAST CYST EXCISION Left 2007  . DILATION AND CURETTAGE OF UTERUS    . LAPAROSCOPY N/A 03/24/2013   Procedure: LAPAROSCOPY OPERATIVE;  Surgeon: Betsy Coder, MD;  Location: [facility];  Service: Gynecology;  Laterality: N/A;  . Left Salpingectomy  03/24/2013   Ectopic Pregnancy  . UNILATERAL SALPINGECTOMY Left 03/24/2013   Procedure: UNILATERAL SALPINGECTOMY;  Surgeon: Betsy Coder, MD;  Location: [facility];  Service: Gynecology;  Laterality: Left;   Family History  Problem Relation Age of Onset  . Asthma Maternal Grandmother   . Hypertension Maternal Grandmother   . Diabetes Maternal Grandmother   . Hypertension Paternal Grandfather   . Diabetes Paternal Grandfather    Social History   Tobacco Use  . Smoking status: Former Research scientist (life sciences)  . Smokeless tobacco: Never Used  Substance Use Topics  . Alcohol use: No  . Drug use: No   Allergies  Allergen Reactions  . Penicillins Rash    Has patient had a PCN reaction causing immediate rash, facial/tongue/throat swelling, SOB or lightheadedness with hypotension: yes Has patient had a PCN reaction causing severe rash involving mucus membranes or skin necrosis: no Has patient had a PCN reaction that required hospitalization no Has patient had a PCN reaction occurring within the last 10 years: yes If all of the above answers are "NO", then may proceed with Cephalosporin use.    Medications Prior to Admission  Medication Sig Dispense Refill Last Dose  . Doxylamine-Pyridoxine (DICLEGIS) 10-10 MG TBEC Take 2 tabs at bedtime. If needed, add another tab in the morning. If needed, add another tab in the afternoon,  up to 4 tabs/day. 100 tablet 5   . metoCLOPramide (REGLAN) 10 MG tablet Take 1 tablet (10 mg total) by mouth 3 (three) times daily before meals. 90 tablet 2   . ondansetron (ZOFRAN ODT) 8 MG disintegrating tablet Take 1 tablet (8 mg total) by mouth every 8 (eight) hours as needed for nausea or vomiting. 20 tablet 0   . Prenatal Vit-Fe Fumarate-FA (PRENATAL MULTIVITAMIN) TABS tablet Take 1 tablet by mouth daily at 12 noon.   07/24/2017 at Unknown time  . promethazine (PHENERGAN) 25 MG tablet Take 1 tablet (25 mg total) by mouth at bedtime as needed. 30 tablet 2     I have reviewed patient's Past Medical Hx, Surgical Hx, Family Hx, Social Hx, medications and  allergies.   ROS:  Review of Systems  Constitutional: Negative for chills and fever.  Gastrointestinal: Positive for abdominal pain, nausea and vomiting. Negative for diarrhea.  Genitourinary: Negative for vaginal bleeding and vaginal discharge.    Physical Exam   Patient Vitals for the past 24 hrs:  BP Temp Pulse Resp  08/16/18 0616 (!) 134/50 (!) 97 F (36.1 C) 80 20   Constitutional: Well-developed, well-nourished female in moderate distress.  Actively vomiting and MAU.  Cardiovascular: normal rate Respiratory: normal effort GI: Abd soft, non-tender, gravid appropriate for gestational age.  MS: Extremities nontender, no edema, normal ROM Neurologic: Alert and oriented x 4.  GU: Neg CVAT.  Pelvic: NEFG, physiologic discharge, no blood, cervix clean. No CMT  Dilation: 2 Effacement (%): 70 Station: -3 Scant bloody show Exam by:: Manya Silvas CNM  FHT:  Baseline 125 , moderate variability, accelerations present, few variable decelerations that resolved w/ position change.  Change in baseline to 110's, then returned to 120's.  Contractions: q 3-5 mins, mild (patient unaware)  Reassessment @ 0900: patient more uncomfortable and writhing in pain from side to side in bed; FHR @ 0900: 120 bpm (intermittent tracing d/t constant movement of patient) / moderate variability / accels present / occ variable decels present / TOCO: regular every 3-4 mins  Labs: Results for orders placed or performed during the hospital encounter of 08/16/18 (from the past 24 hour(s))  Urinalysis, Routine w reflex microscopic     Status: Abnormal   Collection Time: 08/16/18  6:16 AM  Result Value Ref Range   Color, Urine YELLOW YELLOW   APPearance HAZY (A) CLEAR   Specific Gravity, Urine 1.016 1.005 - 1.030   pH 9.0 (H) 5.0 - 8.0   Glucose, UA 50 (A) NEGATIVE mg/dL   Hgb urine dipstick NEGATIVE NEGATIVE   Bilirubin Urine NEGATIVE NEGATIVE   Ketones, ur 80 (A) NEGATIVE mg/dL   Protein, ur 100 (A)  NEGATIVE mg/dL   Nitrite NEGATIVE NEGATIVE   Leukocytes,Ua NEGATIVE NEGATIVE   RBC / HPF 0-5 0 - 5 RBC/hpf   WBC, UA 0-5 0 - 5 WBC/hpf   Bacteria, UA NONE SEEN NONE SEEN   Squamous Epithelial / LPF 11-20 0 - 5   Mucus PRESENT     MAU Course: Orders Placed This Encounter  Procedures  . Urinalysis, Routine w reflex microscopic  . Insert peripheral IV   Meds ordered this encounter  Medications  . promethazine (PHENERGAN) 25 mg in lactated ringers 1,000 mL infusion  . fentaNYL (SUBLIMAZE) injection 50 mcg  . lactated ringers bolus 1,000 mL  . ondansetron (ZOFRAN) injection 4 mg   Contractions spaced out significantly.  Patient resting comfortably.  No cervical change after 1 hour.  Discussed  fetal heart rate tracing (change in baseline, variables), contractions, nausea, vomiting with Dr. Kennon Rounds.  Recommends prolonged monitoring in MAU and discussion with [facility] provider if there are ongoing concerns about changes in fetal heart rate baseline or decels.  Care of patient turned over to Laury Deep, CNM at [place], [place], [place] 08/16/2018 8:06 AM  0845: RN reports that patient reported "feeling a little nauseated" after trying ice chips. 0855: Patient vomited a small amount of water after having ice chips. 0900: Patient writhing back and forth in bed, stating she is having abdominal pain  *Consult with Dr. Mancel Bale @ [phone] - notified of patient's complaints, assessments, lab & NST results, recommended tx plan admit for observation  Dr. Mancel Bale will let J. [place], [place] know of admission  Assessment and Plan: 1. Nausea and vomiting during pregnancy  2. Abdominal pain during pregnancy in third trimester - Admit to L&D for observation - Care assumed by J. [place], [place] upon admission - Orders to be entered by J. [facility], [place] upon admission  [place], [place] 08/16/2018 9:00 AM

## 2018-08-16 NOTE — MAU Note (Addendum)
Vomiting since 0200. No diarrhea. Lower abd pain. Vomited 10x/hr since 0300. Last ate 1600 and drank on way to hospital.

## 2018-08-16 NOTE — Progress Notes (Signed)
Baby active

## 2018-08-16 NOTE — Progress Notes (Signed)
Fetus very difficult to trace because of constant maternal movement. Readjusted ultrasound to better graph fetus.

## 2018-08-16 NOTE — Progress Notes (Signed)
Labor Progress Note  Debbie Hughes is a 28 y.o. female, [id number], IUP at 35 weeks, Pt admitted for 24 hour observation due to hyperemesis during third trimester, pt presented to the MAU for N/V/D that started at 0200 this morning. Pt endorses having stomach spasms, but not like labor, pt denies feeling cxt, stated "this doesn't feel like labor". Pt is wiggling around in bed and states she always acts this way when she is uncomfortable. Pt has h/o hyperemesis graverium during all her pregnancy but did not last this time. Pt denies h/o gallstones, HepB &C. Pt denies drug use.  Pt denies trying anything to make it worse or better, denies cp, sob, fevers, constipation, denies eating different foods in last 24 hours denies recent travels or sick contacts, pt denies uri s/sx. Pt denies cp, sob, . Pt endorse + Fm. Denies vaginal leakage. Denies vaginal bleeding. Denies feeling cxt's. GBS Unknown.   Subjective: Called by RN, RN stated pt making cervical change to 3/80/0, I informed them to transfer pt to LD, when pt got to LD, pt appeared to be in the same condition and has not made any cervical change, , pt still denies being in labor, pt does endorse feeling that her pain is more constant, no new medication have been given to her as order, such as bentyl or procardia. Pt denies HA, vision changes no RUQ pain. During assessment pt stated she had pain when the RN pushed on her abdomen but not at rest.  Patient Active Problem List   Diagnosis Date Noted  . Nausea and vomiting during pregnancy 08/16/2018  . Abdominal pain during pregnancy in third trimester 08/16/2018  . Emesis 08/16/2018  . Normal labor 07/25/2017  . NSVD (normal spontaneous vaginal delivery) 07/25/2017  . Breast cyst, left (excised in 2007) 11/23/2016  . Hyperemesis gravidarum 11/22/2016  . Acute generalized abdominal pain 11/22/2016  . Allergy to penicillin 09/22/2013  . Hx of ectopic pregnancy---s/p left salpingectomy 03/2013 09/22/2013    Objective: BP 119/67 (BP Location: Left Arm)   Pulse 78   Temp 98.1 F (36.7 C) (Oral)   Resp 16   Ht 5\' 3"  (1.6 m)   SpO2 100%   BMI 23.78 kg/m  No intake/output data recorded. No intake/output data recorded. NST: FHR baseline 130s bpm, Variability: moderate, Accelerations:present, Decelerations:  Absent= Cat 1/Reactive CTX:  regular, every 3-4 minutes Uterus gravid, soft non tender, moderate to palpate with contractions.  SVE:  Dilation: 2 Effacement (%): 80 Station: -1 Exam by:: J Alida Greiner CNM  Assessment:  Debbie Hughes, 28 y.o., [id number], with an IUP @ [redacted]w[redacted]d, presented for 24 hour obs for preterm cxt, and hyperemesis. GBS-. Reactive NST. Pt status is unchanged, still in pain and wiggling around in bed which pt endorse she always acts this way when she is uncomfortable. Pt stated pain still 8/10 now more constant. X1 emesis since been transferred.  Patient Active Problem List   Diagnosis Date Noted  . Nausea and vomiting during pregnancy 08/16/2018  . Abdominal pain during pregnancy in third trimester 08/16/2018  . Emesis 08/16/2018  . Normal labor 07/25/2017  . NSVD (normal spontaneous vaginal delivery) 07/25/2017  . Breast cyst, left (excised in 2007) 11/23/2016  . Hyperemesis gravidarum 11/22/2016  . Acute generalized abdominal pain 11/22/2016  . Allergy to penicillin 09/22/2013  . Hx of ectopic pregnancy---s/p left salpingectomy 03/2013 09/22/2013   NICHD: Category 1  Membranes:  Intact, no s/s of infection  Hyperemesis: zofran @ 0703, phenergan @  0600  Preterm Cxt/Abdominal pain: no improvement, feeling more constant now with back pain. Send UC. No  vaginal bleeding or leakage. Procardia given 10 mg PO @ 1222  Pain management:               IV pain management: x 2 doses of 2mcg IV fentanyl, @0635  & 1125  GBS Negative  Plan: Transfer to LD  Continue labor plan Continuous monitoring Rest Hyperemesis: Pepcid 20 mg IV to be given now, amylase, lipase,  cmp, cbc pending. Abdominal Pain/Cxt: rehydration, PCR pending, IV morphine and 25mg  IV phenergan to be given now. Pending UC Frequent position changes to facilitate fetal rotation and descent. Will reassess with cervical exam if necessary Continue pitocin per protocol  Md Mancel Bale was consulted and aware of plan and verbalized agreement.   Noralyn Pick, NP-C, CNM, MSN 08/16/2018. 1:23 PM

## 2018-08-16 NOTE — Discharge Instructions (Signed)
Abdominal Pain, Adult  Many things can cause belly (abdominal) pain. Most times, belly pain is not dangerous. Many cases of belly pain can be watched and treated at home. Sometimes belly pain is serious, though. Your doctor will try to find the cause of your belly pain. Follow these instructions at home:  Take over-the-counter and prescription medicines only as told by your doctor. Do not take medicines that help you poop (laxatives) unless told to by your doctor.  Drink enough fluid to keep your pee (urine) clear or pale yellow.  Watch your belly pain for any changes.  Keep all follow-up visits as told by your doctor. This is important. Contact a doctor if:  Your belly pain changes or gets worse.  You are not hungry, or you lose weight without trying.  You are having trouble pooping (constipated) or have watery poop (diarrhea) for more than 2-3 days.  You have pain when you pee or poop.  Your belly pain wakes you up at night.  Your pain gets worse with meals, after eating, or with certain foods.  You are throwing up and cannot keep anything down.  You have a fever. Get help right away if:  Your pain does not go away as soon as your doctor says it should.  You cannot stop throwing up.  Your pain is only in areas of your belly, such as the right side or the left lower part of the belly.  You have bloody or black poop, or poop that looks like tar.  You have very bad pain, cramping, or bloating in your belly.  You have signs of not having enough fluid or water in your body (dehydration), such as: ? Dark pee, very little pee, or no pee. ? Cracked lips. ? Dry mouth. ? Sunken eyes. ? Sleepiness. ? Weakness. This information is not intended to replace advice given to you by your health care provider. Make sure you discuss any questions you have with your health care provider. Document Released: 10/09/2007 Document Revised: 11/10/2015 Document Reviewed: 10/04/2015 Elsevier  Interactive Patient Education  2019 [employer].   Abdominal Pain During Pregnancy  Abdominal pain is common during pregnancy, and has many possible causes. Some causes are more serious than others, and sometimes the cause is not known. Abdominal pain can be a sign that labor is starting. It can also be caused by normal growth and stretching of muscles and ligaments during pregnancy. Always tell your health care provider if you have any abdominal pain. Follow these instructions at home:  Do not have sex or put anything in your vagina until your pain goes away completely.  Get plenty of rest until your pain improves.  Drink enough fluid to keep your urine pale yellow.  Take over-the-counter and prescription medicines only as told by your health care provider.  Keep all follow-up visits as told by your health care provider. This is important. Contact a health care provider if:  Your pain continues or gets worse after resting.  You have lower abdominal pain that: ? Comes and goes at regular intervals. ? Spreads to your back. ? Is similar to menstrual cramps.  You have pain or burning when you urinate. Get help right away if:  You have a fever or chills.  You have vaginal bleeding.  You are leaking fluid from your vagina.  You are passing tissue from your vagina.  You have vomiting or diarrhea that lasts for more than 24 hours.  Your baby is moving  less than usual.  You feel very weak or faint.  You have shortness of breath.  You develop severe pain in your upper abdomen. Summary  Abdominal pain is common during pregnancy, and has many possible causes.  If you experience abdominal pain during pregnancy, tell your health care provider right away.  Follow your health care provider's home care instructions and keep all follow-up visits as directed. This information is not intended to replace advice given to you by your health care provider. Make sure you discuss any  questions you have with your health care provider. Document Released: 04/22/2005 Document Revised: 07/25/2016 Document Reviewed: 07/25/2016 Elsevier Interactive Patient Education  2019 [employer].

## 2018-08-16 NOTE — Progress Notes (Signed)
Difficult to trace FHT's continuously due to maternal movement.  Pt is turning side to side in bed and moaning.  Difficult to determine pain sources and history.  Abdomen palpates soft and nontender but then pt states she has a sharp pain in upper epigastric area.  [place] aware.  Orders requested and noted.

## 2018-08-16 NOTE — Discharge Summary (Signed)
Antepartum OB Discharge Summary     Patient Name: Debbie Hughes DOB: 1990/06/01 MRN: [id number]  Date of admission: 08/16/2018 Delivering MD: This patient has no babies on file. Date of discharge: 08/16/2018  Admitting diagnosis: 36wks constant vomiting Intrauterine pregnancy: [redacted]w[redacted]d    Secondary diagnosis:  Active Problems:   Nausea and vomiting during pregnancy   Abdominal pain during pregnancy in third trimester   Emesis   Preterm contractions  History of Present Illness: Ms. Debbie Debbie Hughes a 28y.o. female, [id number] who presents at 356w0deeks gestation. The patient [facility]  Her pregnancy has been complicated by:  Patient Active Problem List   Diagnosis Date Noted  . Nausea and vomiting during pregnancy 08/16/2018  . Abdominal pain during pregnancy in third trimester 08/16/2018  . Emesis 08/16/2018  . Preterm contractions 08/16/2018  . Normal labor 07/25/2017  . NSVD (normal spontaneous vaginal delivery) 07/25/2017  . Breast cyst, left (excised in 2007) 11/23/2016  . Hyperemesis gravidarum 11/22/2016  . Acute generalized abdominal pain 11/22/2016  . Allergy to penicillin 09/22/2013  . Hx of ectopic pregnancy---s/p left salpingectomy 03/2013 09/22/2013    Hospital course:  Debbie Hughes a [age].o. female, [id number] at 35 weeks, Pt admitted for 24 hour observation due to hyperemesis during third trimester, pt presented to the MAU for N/V/D that started at 0200 this morning. Pt endorses having stomach spasms, but not like labor, pt denies feeling cxt, stated "this doesn't feel like labor". Pt is wiggling around in bed and states she always acts this way when she is uncomfortable. Pt has h/o hyperemesis graverium during all her pregnancy but did not last this time. Pt denies h/o gallstones, HepB &C. Pt denies drug use.  Pt denies trying anything to make it worse or better, denies cp, sob, fevers, constipation,  denies eating different foods in last 24 hours denies recent travels or sick contacts, pt denies uri s/sx. Pt denies cp, sob, . Pt endorse + Fm. Denies vaginal leakage. Denies vaginal bleeding. Denies feeling cxt's. GBS Unknown.   Then: Called by RN, RN stated pt making cervical change to 3/80/0, I informed them to transfer pt to LD, when pt got to LD, pt appeared to be in the same condition and has not made any cervical change, , pt still denies being in labor, pt does endorse feeling that her pain is more constant, no new medication have been given to her as order, such as bentyl or procardia. Pt denies HA, vision changes no RUQ pain. During assessment pt stated she had pain when the RN pushed on her abdomen but not at rest.   Labs resulted neg, amylase wnl, lipase wnl, cmp unremarkable, cbc with elevated wbc at 14.7, pt being having hyperemesis though, hgb 11.6, hct 34.8. GBS-.  Pt.now feels better, endorses wanting to go home, denies vomiting and was po challenged , pt tolerated well, pt denies abdominal or vomiting, endorses +FM. Pt having childcare issues and want to be discharged, pt verbalized understanding of discharge instruction and denies any pain. RN to sent PCR off before d/c complete. NST reactive, no more cxt noted on toco. Pt denies HA, RUQ pain or vision changes.   Consulted with Debbie Debbie Baleverbalized ok to discharge home with one week f/u at [facility], and reactive strip.   Physical exam  Vitals:   08/16/18 0616 08/16/18 1048 08/16/18 1135 08/16/18 1500  BP: (!Marland Kitchen  134/50 119/67  120/70  Pulse: 80 78  60  Resp: 20 16  15   Temp: (!) 97 F (36.1 C) 98.1 F (36.7 C)    TempSrc:  Oral    SpO2:  100%    Height:   5' 3"  (1.6 m)    General: alert, cooperative and no distress Lochia: None, no vaginal bleeding or leakage Uterine Fundus: soft, and abd non-tender or distended  Perineum: Intact DVT Evaluation: No evidence of DVT seen on physical exam. Negative Homan's sign. No cords or  calf tenderness. No significant calf/ankle edema.  Labs: Lab Results  Component Value Date   WBC 14.7 (H) 08/16/2018   HGB 11.6 (L) 08/16/2018   HCT 34.8 (L) 08/16/2018   MCV 95.6 08/16/2018   PLT 232 08/16/2018   CMP Latest Ref Rng & Units 08/16/2018  Glucose 70 - 99 mg/dL 92  BUN 6 - 20 mg/dL 5(L)  Creatinine 0.44 - 1.00 mg/dL 0.53  Sodium 135 - 145 mmol/L 135  Potassium 3.5 - 5.1 mmol/L 3.0(L)  Chloride 98 - 111 mmol/L 103  CO2 22 - 32 mmol/L 19(L)  Calcium 8.9 - 10.3 mg/dL 8.8(L)  Total Protein 6.5 - 8.1 g/dL 6.3(L)  Total Bilirubin 0.3 - 1.2 mg/dL 0.6  Alkaline Phos 38 - 126 U/L 117  AST 15 - 41 U/L 28  ALT 0 - 44 U/L 15    Date of discharge: 08/16/2018 Discharge Diagnoses: hyperemesis, preterm cxt.  Discharge instruction: per After Visit Summary and "Baby and Me Booklet".  After visit meds:   Activity:           pelvic rest Advance as tolerated. Pelvic rest for 6 weeks.  Diet:                routine Medications: PNV and continue prior medication such as diclegis and phenergan, reglan.  Condition:  Pt discharge to home  in stable PTC: If cxt become regular every 5 mins last a min long and occurs for longer then a hour please call [facility] and inform them for advice.  Hyperemesis: Continue to eat protein every 2 hours. Take meds and stay hydrated. If severe pain or fever 100.4 or higher please call [facility].    Discharge Follow Up:  Follow-up [facility] Follow up.   Specialty:  Obstetrics and Gynecology Why:  Please call the office and make an appointment in 1 week for office ROB  visit  Contact information: [address]  [place] [address] [place], NP-C, CNM 08/16/2018, 5:18 PM  Noralyn Pick, [place]

## 2018-08-16 NOTE — H&P (Addendum)
Debbie Hughes is a 28 y.o. female, [id number], IUP at 35 weeks, Pt admitted for 24 hour observation due to hyperemesis during third trimester, pt presented to the MAU for N/V/D that started at 0200 this morning. Pt endorses having stomach spasms, but not like labor, pt denies feeling cxt, stated "this doesn't feel like labor". Pt is wiggling around in bed and states she always acts this way when she is uncomfortable. Pt has h/o hyperemesis graverium during all her pregnancy but did not last this time. Pt denies h/o gallstones, HepB &C. Pt denies drug use.  Pt denies trying anything to make it worse or better, denies cp, sob, fevers, constipation, denies eating different foods in last 24 hours denies recent travels or sick contacts, pt denies uri s/sx. Pt denies cp, sob, . Pt endorse + Fm. Denies vaginal leakage. Denies vaginal bleeding. Denies feeling cxt's. GBS Unknown.   MAU Course:    Orders Placed This Encounter  Procedures  . Urinalysis, Routine w reflex microscopic  . Insert peripheral IV      Meds ordered this encounter  Medications  . promethazine (PHENERGAN) 25 mg in lactated ringers 1,000 mL infusion  . fentaNYL (SUBLIMAZE) injection 50 mcg  . lactated ringers bolus 1,000 mL  . ondansetron (ZOFRAN) injection 4 mg   Patient Active Problem List   Diagnosis Date Noted  . Nausea and vomiting during pregnancy 08/16/2018  . Abdominal pain during pregnancy in third trimester 08/16/2018  . Emesis 08/16/2018  . Normal labor 07/25/2017  . NSVD (normal spontaneous vaginal delivery) 07/25/2017  . Breast cyst, left (excised in 2007) 11/23/2016  . Hyperemesis gravidarum 11/22/2016  . Acute generalized abdominal pain 11/22/2016  . Allergy to penicillin 09/22/2013  . Hx of ectopic pregnancy---s/p left salpingectomy 03/2013 09/22/2013    Medications Prior to Admission  Medication Sig Dispense Refill Last Dose  . Doxylamine-Pyridoxine (DICLEGIS) 10-10 MG TBEC Take 2 tabs at bedtime. If  needed, add another tab in the morning. If needed, add another tab in the afternoon, up to 4 tabs/day. 100 tablet 5   . metoCLOPramide (REGLAN) 10 MG tablet Take 1 tablet (10 mg total) by mouth 3 (three) times daily before meals. 90 tablet 2   . ondansetron (ZOFRAN ODT) 8 MG disintegrating tablet Take 1 tablet (8 mg total) by mouth every 8 (eight) hours as needed for nausea or vomiting. 20 tablet 0   . Prenatal Vit-Fe Fumarate-FA (PRENATAL MULTIVITAMIN) TABS tablet Take 1 tablet by mouth daily at 12 noon.   07/24/2017 at Unknown time  . promethazine (PHENERGAN) 25 MG tablet Take 1 tablet (25 mg total) by mouth at bedtime as needed. 30 tablet 2     Past Medical History:  Diagnosis Date  . Breast mass in female 01/21/06  . Breast nodule 04/26/10   Left  . BV (bacterial vaginosis) 5.10/11  . Candida vaginitis 04/26/10  . Ectopic pregnancy 03/24/2013  . Fibroadenoma   . H/O varicella   . History of chlamydia   . Irregular menstrual cycle 07/28/07  . 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 Vaginal discharge 10/01/07  . Vaginitis      No current facility-administered medications on file prior to encounter.    Current Outpatient Medications on File Prior to Encounter  Medication Sig Dispense Refill  . Doxylamine-Pyridoxine (DICLEGIS) 10-10 MG TBEC Take 2 tabs at bedtime. If needed, add another tab in the morning. If needed, add another tab in the afternoon, up to  4 tabs/day. 100 tablet 5  . metoCLOPramide (REGLAN) 10 MG tablet Take 1 tablet (10 mg total) by mouth 3 (three) times daily before meals. 90 tablet 2  . ondansetron (ZOFRAN ODT) 8 MG disintegrating tablet Take 1 tablet (8 mg total) by mouth every 8 (eight) hours as needed for nausea or vomiting. 20 tablet 0  . Prenatal Vit-Fe Fumarate-FA (PRENATAL MULTIVITAMIN) TABS tablet Take 1 tablet by mouth daily at 12 noon.    . promethazine (PHENERGAN) 25 MG tablet Take 1 tablet (25 mg total) by mouth at bedtime  as needed. 30 tablet 2     Allergies  Allergen Reactions  . Penicillins Rash    Has patient had a PCN reaction causing immediate rash, facial/tongue/throat swelling, SOB or lightheadedness with hypotension: yes Has patient had a PCN reaction causing severe rash involving mucus membranes or skin necrosis: no Has patient had a PCN reaction that required hospitalization no Has patient had a PCN reaction occurring within the last 10 years: yes If all of the above answers are "NO", then may proceed with Cephalosporin use.    OB History    Gravida  5   Para  2   Term  2   Preterm      AB  2   Living  2     SAB  1   TAB      Ectopic  1   Multiple  0   Live Births  2          Past Medical History:  Diagnosis Date  . Breast mass in female 01/21/06  . Breast nodule 04/26/10   Left  . BV (bacterial vaginosis) 5.10/11  . Candida vaginitis 04/26/10  . Ectopic pregnancy 03/24/2013  . Fibroadenoma   . H/O varicella   . History of chlamydia   . Irregular menstrual cycle 07/28/07  . 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 Vaginal discharge 10/01/07  . Vaginitis    Past Surgical History:  Procedure Laterality Date  . BREAST CYST EXCISION Left 2007  . DILATION AND CURETTAGE OF UTERUS    . LAPAROSCOPY N/A 03/24/2013   Procedure: LAPAROSCOPY OPERATIVE;  Surgeon: Betsy Coder, MD;  Location: [facility];  Service: Gynecology;  Laterality: N/A;  . Left Salpingectomy  03/24/2013   Ectopic Pregnancy  . UNILATERAL SALPINGECTOMY Left 03/24/2013   Procedure: UNILATERAL SALPINGECTOMY;  Surgeon: Betsy Coder, MD;  Location: [facility];  Service: Gynecology;  Laterality: Left;   Family History: family history includes Asthma in her maternal grandmother; Diabetes in her maternal grandmother and paternal grandfather; Hypertension in her maternal grandmother and paternal grandfather. Social History:  reports that she has quit smoking. She has never used  smokeless tobacco. She reports that she does not drink alcohol or use drugs.   Prenatal Transfer Tool  Maternal Diabetes: No Genetic Screening: Normal Maternal Ultrasounds/Referrals: Normal Fetal Ultrasounds or other Referrals:  None Maternal Substance Abuse:  No Significant Maternal Medications:  None Significant Maternal Lab Results: None  ROS:  Review of Systems  Constitutional: Negative.   HENT: Negative.   Eyes: Negative.   Respiratory: Negative.   Gastrointestinal: Positive for abdominal pain, diarrhea, nausea and vomiting.  Genitourinary: Negative.   Musculoskeletal: Negative.   Skin: Negative.   Neurological: Negative.   Endo/Heme/Allergies: Negative.   Psychiatric/Behavioral: The patient is nervous/anxious.      Physical Exam: BP 119/67 (BP Location: Left Arm)   Pulse 78   Temp 98.1  F (36.7 C) (Oral)   Resp 16   Ht 5\' 3"  (1.6 m)   SpO2 100%   BMI 23.78 kg/m   Physical Exam  Constitutional: She is oriented to person, place, and time and well-developed, well-nourished, and in no distress.  HENT:  Head: Normocephalic and atraumatic.  Eyes: Pupils are equal, round, and reactive to light. Conjunctivae are normal.  Neck: Normal range of motion. Neck supple.  Cardiovascular: Normal rate, regular rhythm, normal heart sounds and intact distal pulses.  Pulmonary/Chest: Effort normal and breath sounds normal.  Abdominal: Soft.  Hyperactive bowel sounds in all four quadrents  Genitourinary:    Genitourinary Comments: Uterus gravida equal to dates. Pelvis is adequate for vaginal delivery.    Musculoskeletal: Normal range of motion.  Neurological: She is alert and oriented to person, place, and time. She has normal reflexes. Gait normal.  Skin: Skin is warm and dry.  Psychiatric: Affect normal.  Nursing note and vitals reviewed.    NST: FHR baseline 125s bpm, Variability: moderate, Accelerations:present, Decelerations:  Absent= Cat 1/Reactive UC:   irregular,  every 2-4 minutes, lasting 60 second, maternal perception id low to none.  SVE:   Dilation: 2 Effacement (%): 80 Station: -2, -1 Exam by:: [facility], vertex verified by fetal sutures.  Leopold's: Position vertex, EFW 5.5 lbs via leopold's.   Labs: Results for orders placed or performed during the hospital encounter of 08/16/18 (from the past 24 hour(s))  Urinalysis, Routine w reflex microscopic     Status: Abnormal   Collection Time: 08/16/18  6:16 AM  Result Value Ref Range   Color, Urine YELLOW YELLOW   APPearance HAZY (A) CLEAR   Specific Gravity, Urine 1.016 1.005 - 1.030   pH 9.0 (H) 5.0 - 8.0   Glucose, UA 50 (A) NEGATIVE mg/dL   Hgb urine dipstick NEGATIVE NEGATIVE   Bilirubin Urine NEGATIVE NEGATIVE   Ketones, ur 80 (A) NEGATIVE mg/dL   Protein, ur 100 (A) NEGATIVE mg/dL   Nitrite NEGATIVE NEGATIVE   Leukocytes,Ua NEGATIVE NEGATIVE   RBC / HPF 0-5 0 - 5 RBC/hpf   WBC, UA 0-5 0 - 5 WBC/hpf   Bacteria, UA NONE SEEN NONE SEEN   Squamous Epithelial / LPF 11-20 0 - 5   Mucus PRESENT     Imaging:  No results found.  MAU Course: Orders Placed This Encounter  Procedures  . Group B strep by PCR  . Urinalysis, Routine w reflex microscopic  . Notify physician (specify)  . Vital signs  . Defer vaginal exam for vaginal bleeding or PROM <37 weeks  . Initiate Oral Care Protocol  . Initiate Carrier Fluid Protocol  . SCDs  . Practitioner attestation of consent  . Continuous tocometry  . Fetal monitoring  . Activity as tolerated  . Full code  . Type and screen [place]  . Insert peripheral IV  . Place in observation (patient's expected length of stay will be less than 2 midnights)   Meds ordered this encounter  Medications  . promethazine (PHENERGAN) 25 mg in lactated ringers 1,000 mL infusion  . fentaNYL (SUBLIMAZE) injection 50 mcg  . lactated ringers bolus 1,000 mL  . ondansetron (ZOFRAN) injection 4 mg  . acetaminophen (TYLENOL) tablet 650  mg  . zolpidem (AMBIEN) tablet 5 mg  . docusate sodium (COLACE) capsule 100 mg  . calcium carbonate (TUMS - dosed in mg elemental calcium) chewable tablet 400 mg of elemental calcium  . prenatal multivitamin tablet 1  tablet  . lactated ringers infusion  . NIFEdipine (PROCARDIA) capsule 10 mg  . fentaNYL (SUBLIMAZE) injection 50 mcg  . dicyclomine (BENTYL) injection 20 mg    Assessment/Plan: Debbie Hughes is a 28 y.o. female, [id number], IUP at 35 weeks, Pt admitted for 24 hour observation due to hyperemesis during third trimester, pt presented to the MAU for N/V/D that started at 0200 this morning. Pt endorses having stomach spasms, but not like labor, pt denies feeling cxt, stated "this doesn't feel like labor". Pt is wiggling around in bed and states she always acts this way when she is uncomfortable. Pt has h/o hyperemesis graverium during all her pregnancy but did not last this time. Pt denies h/o gallstones, HepB &C. Pt denies drug use.  Pt denies trying anything to make it worse or better, denies cp, sob, fevers, constipation, denies eating different foods in last 24 hours denies recent travels or sick contacts, pt denies uri s/sx. Pt denies cp, sob, . Pt endorse + Fm. Denies vaginal leakage. Denies vaginal bleeding. Denies feeling cxt's. GBS Unknown. UA in MAU appeared dirty and dehydrated with evidence of ketones, glucose, and proteins, PH 9.0, SG 1.016 and hazy in appearance. GBS PCR sent. High suspicion for viral GI.   FWB: Cat 1 Fetal Tracing.   Plan: Admit to Antepartum per consult with Dr Mancel Bale for 24 hours obs Routine Antenatal [facility] orders N/V/D: bentyl 20mg  IV Q8H PRN, zofran 4 mg Q6H PRN, phenergan 12.5 @8H  PRN, with fentanyl x1 dose for pain control. Rehydration IV. CMP pending.  Preterm CXt: procardia 10 mg TIS PRN, rehydration, monitor for cervical change, if cervix changes transfer pt to LD and d/c procardia.  PCR GBS: Pending  Anticipate rseoluiton of cxt and discharge  with no cervical change in 24 hours.   Noralyn Pick NP-C, CNM, MSN 08/16/2018, 11:42 AM

## 2018-08-17 LAB — URINE CULTURE

## 2018-08-17 LAB — ABO/RH: ABO/RH(D): A POS

## 2018-08-25 ENCOUNTER — Inpatient Hospital Stay (HOSPITAL_COMMUNITY)
Admission: AD | Admit: 2018-08-25 | Discharge: 2018-08-27 | DRG: 805 | Disposition: A | Discharge status: Home / Self Care | Payer: BC Managed Care – PPO | Attending: Obstetrics & Gynecology | Admitting: Obstetrics & Gynecology

## 2018-08-25 DIAGNOSIS — Z3A36 36 weeks gestation of pregnancy: Secondary | ICD-10-CM

## 2018-08-25 DIAGNOSIS — Z87891 Personal history of nicotine dependence: Secondary | ICD-10-CM

## 2018-08-25 DIAGNOSIS — Z880 Allergy status to penicillin: Secondary | ICD-10-CM | POA: Diagnosis not present

## 2018-08-25 MED ORDER — LIDOCAINE HCL (PF) 1 % IJ SOLN: 30.0000 mL | INTRAMUSCULAR | Status: DC | PRN

## 2018-08-25 MED ORDER — ACETAMINOPHEN 325 MG PO TABS: 650.0000 mg | ORAL_TABLET | ORAL | Status: DC | PRN

## 2018-08-25 MED ORDER — OXYCODONE-ACETAMINOPHEN 5-325 MG PO TABS: 2.0000 | ORAL_TABLET | ORAL | Status: DC | PRN

## 2018-08-25 MED ORDER — LACTATED RINGERS IV SOLN: INTRAVENOUS | Status: DC

## 2018-08-25 MED ORDER — FLEET ENEMA 7-19 GM/118ML RE ENEM: 1.0000 | ENEMA | RECTAL | Status: DC | PRN

## 2018-08-25 MED ORDER — OXYTOCIN 40 UNITS IN NORMAL SALINE INFUSION - SIMPLE MED: 2.5000 [IU]/h | INTRAVENOUS | Status: DC

## 2018-08-25 MED ORDER — OXYTOCIN BOLUS FROM INFUSION: 500.0000 mL | Freq: Once | INTRAVENOUS | Status: DC

## 2018-08-25 MED ORDER — SOD CITRATE-CITRIC ACID 500-334 MG/5ML PO SOLN: 30.0000 mL | ORAL | Status: DC | PRN

## 2018-08-25 MED ORDER — ONDANSETRON 4 MG PO TBDP: 4.0000 mg | ORAL_TABLET | Freq: Three times a day (TID) | ORAL | Status: DC | PRN

## 2018-08-25 MED ORDER — IBUPROFEN 600 MG PO TABS
600.0000 mg | ORAL_TABLET | Freq: Once | ORAL | Status: AC
  Administered 2018-08-25: 600 mg via ORAL
  Filled 2018-08-25: qty 1

## 2018-08-25 MED ORDER — ONDANSETRON HCL 4 MG/2ML IJ SOLN
4.0000 mg | Freq: Four times a day (QID) | INTRAMUSCULAR | Status: DC | PRN
  Filled 2018-08-25: qty 2

## 2018-08-25 MED ORDER — ONDANSETRON HCL 4 MG/2ML IJ SOLN
4.0000 mg | Freq: Four times a day (QID) | INTRAMUSCULAR | Status: DC | PRN
  Administered 2018-08-25: 4 mg via INTRAMUSCULAR

## 2018-08-25 MED ORDER — OXYCODONE-ACETAMINOPHEN 5-325 MG PO TABS: 1.0000 | ORAL_TABLET | ORAL | Status: DC | PRN

## 2018-08-25 MED ORDER — LACTATED RINGERS IV SOLN: 500.0000 mL | INTRAVENOUS | Status: DC | PRN

## 2018-08-25 NOTE — H&P (Signed)
Debbie Hughes is a 28 y.o. female presenting for active labor with precipitous delivery of vigorous preterm female at 51w and 2d on arrival to L&D.   OB History    Gravida  5   Para  2   Term  2   Preterm      AB  2   Living  2     SAB  1   TAB      Ectopic  1   Multiple  0   Live Births  2          Past Medical History:  Diagnosis Date  . Breast mass in female 01/21/06  . Breast nodule 04/26/10   Left  . BV (bacterial vaginosis) 5.10/11  . Candida vaginitis 04/26/10  . Ectopic pregnancy 03/24/2013  . Fibroadenoma   . H/O varicella   . History of chlamydia   . Irregular menstrual cycle 07/28/07  . 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 Vaginal discharge 10/01/07  . Vaginitis    Past Surgical History:  Procedure Laterality Date  . BREAST CYST EXCISION Left 2007  . DILATION AND CURETTAGE OF UTERUS    . LAPAROSCOPY N/A 03/24/2013   Procedure: LAPAROSCOPY OPERATIVE;  Surgeon: Betsy Coder, MD;  Location: [facility];  Service: Gynecology;  Laterality: N/A;  . Left Salpingectomy  03/24/2013   Ectopic Pregnancy  . UNILATERAL SALPINGECTOMY Left 03/24/2013   Procedure: UNILATERAL SALPINGECTOMY;  Surgeon: Betsy Coder, MD;  Location: [facility];  Service: Gynecology;  Laterality: Left;   Family History: family history includes Asthma in her maternal grandmother; Diabetes in her maternal grandmother and paternal grandfather; Hypertension in her maternal grandmother and paternal grandfather. Social History:  reports that she has quit smoking. She has never used smokeless tobacco. She reports that she does not drink alcohol or use drugs.     Maternal Diabetes: No Genetic Screening: Declined Maternal Ultrasounds/Referrals: Normal Fetal Ultrasounds or other Referrals:  Other: growth q4 weeks for history of small for gestational age births  Maternal Substance Abuse:  No Significant Maternal Medications:  None Significant Maternal Lab  Results:  None Other Comments:  None  Review of Systems  Gastrointestinal: Positive for abdominal pain and vomiting.  All other systems reviewed and are negative.  Maternal Medical History:  Reason for admission: Rupture of membranes and contractions.   Contractions: Onset was 3-5 hours ago.   Frequency: regular.   Perceived severity is strong.    Fetal activity: Perceived fetal activity is normal.   Last perceived fetal movement was within the past hour.    Prenatal complications: Preterm labor.   Prenatal Complications - Diabetes: none.      currently breastfeeding.   Maternal Exam:  Uterine Assessment: Contraction strength is firm.  Contraction frequency is regular.   Abdomen: Patient reports no abdominal tenderness. Fundal height is Size=dates.   Estimated fetal weight is 5lbs .   Fetal presentation: vertex  Introitus: Normal vulva. Normal vagina.  Vagina is negative for discharge.  Amniotic fluid character: not assessed.  Pelvis: adequate for delivery.   Cervix: Cervix evaluated by digital exam.     Fetal Exam Fetal Monitor Review: Mode: ultrasound.   Baseline rate: 110s.  Variability: moderate (6-25 bpm).   Pattern: accelerations present and no decelerations.    Fetal State Assessment: Category I - tracings are normal.     Physical Exam  Nursing note and vitals reviewed. Constitutional: She is oriented to person, place,  and time. She appears well-developed and well-nourished.  HENT:  Head: Normocephalic and atraumatic.  Eyes: Pupils are equal, round, and reactive to light.  Cardiovascular: Normal rate, regular rhythm and normal heart sounds.  Respiratory: Effort normal and breath sounds normal. No respiratory distress.  GI: There is no abdominal tenderness.  Genitourinary:    Vulva, vagina and uterus normal.     No vaginal discharge.   Musculoskeletal: Normal range of motion.  Neurological: She is alert and oriented to person, place, and time.   Skin: Skin is warm and dry.  Psychiatric: She has a normal mood and affect. Her behavior is normal. Judgment and thought content normal.     Prenatal labs: ABO, Rh: --/--/A POS, A POS (04/12 1233) Antibody: NEG (04/12 1233) Rubella:  Immune RPR:   NR HBsAg:   NR HIV:   NR GBS:   Negative   Assessment/Plan: 28 y.o.  G4P2 at 100w2d Admitted in active labor Precipitous delivery of vigorous female Admit to L&D and transfer to postpartum  Debbie Hughes 08/25/2018, 11:52 PM

## 2018-08-25 NOTE — MAU Note (Signed)
PT CAME IN- REG CALLED AND  SAID  COME TO GET PT-  TO RM 120 VIA W/C-    FHR- 120- VE - RIM, MARIE, CNM . SAYS SROM  AT 8 PM - CLEAR  FLUID.  TRANSFER TO RM 208- VIA BED- SAFE TRANSPORT  WITH MARIE, CNM

## 2018-08-25 NOTE — MAU Note (Signed)
PT ARRIVED - AT REG- CALLED  TO COME GET PT.   TO RM 120 VIA W/C .  FHR- 120- VE RIM, MARIE, CNM   PT SAYS SROM AT 8PM- CLEAR FLUID.  TO RM 208- VIA BED- SAFE TRANSPORT- WITH MARIE, CNM AT BEDSIDE

## 2018-08-26 ENCOUNTER — Encounter (HOSPITAL_COMMUNITY): Payer: Self-pay

## 2018-08-26 LAB — CBC
HCT: 34.7 % — ABNORMAL LOW (ref 36.0–46.0)
Hemoglobin: 12 g/dL (ref 12.0–15.0)
MCH: 33.2 pg (ref 26.0–34.0)
MCHC: 34.6 g/dL (ref 30.0–36.0)
MCV: 96.1 fL (ref 80.0–100.0)
Platelets: 232 10*3/uL (ref 150–400)
RBC: 3.61 MIL/uL — ABNORMAL LOW (ref 3.87–5.11)
RDW: 12 % (ref 11.5–15.5)
WBC: 12.9 10*3/uL — ABNORMAL HIGH (ref 4.0–10.5)
nRBC: 0 % (ref 0.0–0.2)

## 2018-08-26 LAB — RPR: RPR Ser Ql: NONREACTIVE

## 2018-08-26 LAB — TYPE AND SCREEN
ABO/RH(D): A POS
Antibody Screen: NEGATIVE

## 2018-08-26 MED ORDER — OXYTOCIN BOLUS FROM INFUSION: 500.0000 mL | Freq: Once | INTRAVENOUS | Status: DC

## 2018-08-26 MED ORDER — KETOROLAC TROMETHAMINE 15 MG/ML IJ SOLN
30.0000 mg | Freq: Four times a day (QID) | INTRAMUSCULAR | Status: DC | PRN
  Administered 2018-08-26: 30 mg via INTRAVENOUS
  Filled 2018-08-26 (×2): qty 2

## 2018-08-26 MED ORDER — BENZOCAINE-MENTHOL 20-0.5 % EX AERO: 1.0000 "application " | INHALATION_SPRAY | CUTANEOUS | Status: DC | PRN

## 2018-08-26 MED ORDER — ACETAMINOPHEN 325 MG PO TABS: 650.0000 mg | ORAL_TABLET | ORAL | Status: DC | PRN

## 2018-08-26 MED ORDER — DIPHENHYDRAMINE HCL 25 MG PO CAPS: 25.0000 mg | ORAL_CAPSULE | Freq: Four times a day (QID) | ORAL | Status: DC | PRN

## 2018-08-26 MED ORDER — IBUPROFEN 600 MG PO TABS
600.0000 mg | ORAL_TABLET | Freq: Four times a day (QID) | ORAL | Status: DC
  Administered 2018-08-26 – 2018-08-27 (×4): 600 mg via ORAL
  Filled 2018-08-26 (×5): qty 1

## 2018-08-26 MED ORDER — ZOLPIDEM TARTRATE 5 MG PO TABS: 5.0000 mg | ORAL_TABLET | Freq: Every evening | ORAL | Status: DC | PRN

## 2018-08-26 MED ORDER — LIDOCAINE HCL (PF) 1 % IJ SOLN
INTRAMUSCULAR | Status: AC
  Filled 2018-08-26: qty 30

## 2018-08-26 MED ORDER — LACTATED RINGERS IV SOLN: 500.0000 mL | INTRAVENOUS | Status: DC | PRN

## 2018-08-26 MED ORDER — COCONUT OIL OIL: 1.0000 "application " | TOPICAL_OIL | Status: DC | PRN

## 2018-08-26 MED ORDER — SOD CITRATE-CITRIC ACID 500-334 MG/5ML PO SOLN: 30.0000 mL | ORAL | Status: DC | PRN

## 2018-08-26 MED ORDER — LACTATED RINGERS IV SOLN
INTRAVENOUS | Status: DC
  Administered 2018-08-26: 16:00:00 via INTRAVENOUS

## 2018-08-26 MED ORDER — OXYCODONE-ACETAMINOPHEN 5-325 MG PO TABS: 1.0000 | ORAL_TABLET | ORAL | Status: DC | PRN

## 2018-08-26 MED ORDER — ONDANSETRON HCL 4 MG PO TABS: 4.0000 mg | ORAL_TABLET | ORAL | Status: DC | PRN

## 2018-08-26 MED ORDER — PROMETHAZINE HCL 25 MG PO TABS
25.0000 mg | ORAL_TABLET | Freq: Four times a day (QID) | ORAL | Status: DC | PRN
  Filled 2018-08-26: qty 1

## 2018-08-26 MED ORDER — LACTATED RINGERS IV SOLN: INTRAVENOUS | Status: DC

## 2018-08-26 MED ORDER — PRENATAL MULTIVITAMIN CH
1.0000 | ORAL_TABLET | Freq: Every day | ORAL | Status: DC
  Administered 2018-08-27: 1 via ORAL
  Filled 2018-08-26: qty 1

## 2018-08-26 MED ORDER — TETANUS-DIPHTH-ACELL PERTUSSIS 5-2.5-18.5 LF-MCG/0.5 IM SUSP: 0.5000 mL | Freq: Once | INTRAMUSCULAR | Status: DC

## 2018-08-26 MED ORDER — OXYTOCIN 40 UNITS IN NORMAL SALINE INFUSION - SIMPLE MED: 2.5000 [IU]/h | INTRAVENOUS | Status: DC

## 2018-08-26 MED ORDER — KETOROLAC TROMETHAMINE 15 MG/ML IJ SOLN
15.0000 mg | Freq: Four times a day (QID) | INTRAMUSCULAR | Status: DC | PRN
  Administered 2018-08-26: 15 mg via INTRAVENOUS
  Filled 2018-08-26 (×2): qty 1

## 2018-08-26 MED ORDER — ACETAMINOPHEN 10 MG/ML IV SOLN
1000.0000 mg | Freq: Four times a day (QID) | INTRAVENOUS | Status: AC | PRN
  Filled 2018-08-26: qty 100

## 2018-08-26 MED ORDER — MEDROXYPROGESTERONE ACETATE 150 MG/ML IM SUSP: 150.0000 mg | INTRAMUSCULAR | Status: DC | PRN

## 2018-08-26 MED ORDER — OXYTOCIN 40 UNITS IN NORMAL SALINE INFUSION - SIMPLE MED
INTRAVENOUS | Status: AC
  Filled 2018-08-26: qty 1000

## 2018-08-26 MED ORDER — OXYCODONE HCL 5 MG PO TABS: 5.0000 mg | ORAL_TABLET | Freq: Once | ORAL | Status: DC

## 2018-08-26 MED ORDER — WITCH HAZEL-GLYCERIN EX PADS: 1.0000 "application " | MEDICATED_PAD | CUTANEOUS | Status: DC | PRN

## 2018-08-26 MED ORDER — PROMETHAZINE HCL 25 MG/ML IJ SOLN
25.0000 mg | Freq: Four times a day (QID) | INTRAMUSCULAR | Status: DC | PRN
  Administered 2018-08-26 (×2): 25 mg via INTRAVENOUS
  Filled 2018-08-26 (×2): qty 1

## 2018-08-26 MED ORDER — FENTANYL CITRATE (PF) 100 MCG/2ML IJ SOLN: 100.0000 ug | INTRAMUSCULAR | Status: DC | PRN

## 2018-08-26 MED ORDER — LIDOCAINE HCL (PF) 1 % IJ SOLN: 30.0000 mL | INTRAMUSCULAR | Status: DC | PRN

## 2018-08-26 MED ORDER — KETOROLAC TROMETHAMINE 30 MG/ML IJ SOLN
30.0000 mg | Freq: Once | INTRAMUSCULAR | Status: AC
  Administered 2018-08-26: 30 mg via INTRAVENOUS
  Filled 2018-08-26: qty 1

## 2018-08-26 MED ORDER — OXYTOCIN 10 UNIT/ML IJ SOLN
INTRAMUSCULAR | Status: AC
  Administered 2018-08-25: 23:00:00 10 [IU]
  Filled 2018-08-26: qty 1

## 2018-08-26 MED ORDER — SIMETHICONE 80 MG PO CHEW: 80.0000 mg | CHEWABLE_TABLET | ORAL | Status: DC | PRN

## 2018-08-26 MED ORDER — DIBUCAINE (PERIANAL) 1 % EX OINT: 1.0000 "application " | TOPICAL_OINTMENT | CUTANEOUS | Status: DC | PRN

## 2018-08-26 MED ORDER — OXYCODONE-ACETAMINOPHEN 5-325 MG PO TABS: 2.0000 | ORAL_TABLET | ORAL | Status: DC | PRN

## 2018-08-26 MED ORDER — SENNOSIDES-DOCUSATE SODIUM 8.6-50 MG PO TABS
2.0000 | ORAL_TABLET | ORAL | Status: DC
  Administered 2018-08-26: 2 via ORAL
  Filled 2018-08-26 (×2): qty 2

## 2018-08-26 MED ORDER — ONDANSETRON HCL 4 MG/2ML IJ SOLN
4.0000 mg | Freq: Four times a day (QID) | INTRAMUSCULAR | Status: DC | PRN
  Filled 2018-08-26 (×2): qty 2

## 2018-08-26 MED ORDER — ONDANSETRON HCL 4 MG/2ML IJ SOLN: 4.0000 mg | Freq: Four times a day (QID) | INTRAMUSCULAR | Status: DC | PRN

## 2018-08-26 MED ORDER — ONDANSETRON HCL 4 MG/2ML IJ SOLN
4.0000 mg | INTRAMUSCULAR | Status: DC | PRN
  Administered 2018-08-26 (×2): 4 mg via INTRAVENOUS

## 2018-08-26 NOTE — Progress Notes (Signed)
Post Partum Day 1 Subjective: Patient is experiencing intractable nausea and vomiting per RN. She was uncomfortable after delivery and tried PO Ibuprofen but vomited it up. IV to be inserted by RN and patient given Zofran and Phenergan to alternate PRN nausea and vomiting. A one time dose of IV toradol ordered for postpartum pain until patient stable and able to tolerate PO Ibuprofen. Per pharmacy, a one time dose is safe as patient did vomit the ibuprofen.   Objective: Vitals:   08/25/18 2346 08/25/18 2356 08/26/18 0001 08/26/18 0016  BP: 108/62  124/62 120/77  Pulse: 92  68 (!) 110  Resp: 16     Temp: 98.1 F (36.7 C)     TempSrc: Oral     Weight:  65.8 kg    Height:  5\' 3"  (1.6 m)    ' Physical Exam:  General: alert and cooperative Lochia: appropriate Uterine Fundus: firm Incision: n/a DVT Evaluation: No evidence of DVT seen on physical exam. Negative Homan's sign. No cords or calf tenderness. No significant calf/ankle edema.  No results for input(s): HGB, HCT in the last 72 hours.  Assessment/Plan: Plan for discharge tomorrow and Circumcision prior to discharge   LOS: 1 day   [name] 08/26/2018, 1:09 AM

## 2018-08-26 NOTE — Lactation Note (Signed)
This note was copied from a baby's chart. Lactation Consultation Note  Patient Name: Debbie Hughes [id number] Date: 08/26/2018 Reason for consult: Initial assessment;Late-preterm 34-36.6wks P3, 8 hour female infant. Infant had one void and one stool since delivery . Mom has DEBP at home. Mom is experienced at breastfeeding she breastfeed her other two children for 5 and 7 months.  Mom unable to breastfeed due repeated episodes of vomiting while LC and Nurse in room.  LC help mom with hand expression and infant was given 10 ml of colostrum by spoon. LC discussed BF and supplemental guidelines for LPTI  based on infant age/ hours of life, mom prefers to supplement with EBM at this time. Nurse will set up DEBP and mom will pump until she is able to latch infant to breast once nausea is resolved. Mom knows to breastfeed infant according to  hunger cues, 8 or more times within 24 hours. LC discussed I & O. Reviewed Baby & Me book's Breastfeeding Basics.  Mom knows to call Nurse or [place] if she has any questions, concerns or needs assistance with latching infant to breast. Mom made aware of O/P services, breastfeeding support groups, community resources, and our phone # for post-discharge questions.  Maternal Data Formula Feeding for Exclusion: No Has patient been taught Hand Expression?: Yes(infant given 10 ml by spoon) Does the patient have breastfeeding experience prior to this delivery?: Yes  Feeding Feeding Type: Breast Fed  LATCH Score                   Interventions Interventions: Breast feeding basics reviewed;Skin to skin;Hand express;Expressed milk;Position options  Lactation Tools Discussed/Used WIC Program: No   Consult Status Consult Status: Follow-up Date: 08/27/18 Follow-up type: In-patient    [name] 08/26/2018, 7:26 AM

## 2018-08-26 NOTE — Lactation Note (Signed)
This note was copied from a baby's chart. Lactation Consultation Note  Patient Name: Debbie Hughes [id number] Date: 08/26/2018 Reason for consult: Follow-up assessment;Late-preterm 34-36.6wks  P3 mother whose infant is now 15 hours old.  This is a LPTI at 36+2 weeks weighing < 6 lbs.  Mother was holding baby STS when I arrived.  Mother has been having issues with nausea and vomiting today.  She has a DEBP set up in room but has not been pumping consistently.  Encouraged her to pump and provided reasons why pumping after breast feeding would be beneficial to baby.  Mother stated that he has been feeding well and about "every 40 minutes" so she does not have time to pump.  Mother has reviewed the LPTI policy sheet and had no questions.  She does not desire to pump at this time but will consider pumping after the next feeding.  She does not wish to supplement with formula and I see no reason to do this if she begins pumping and supplementing with her EBM which is what I recommended.  Mother verbalized understanding.  She will call for latch assistance as needed.   Maternal Data Formula Feeding for Exclusion: No Has patient been taught Hand Expression?: Yes Does the patient have breastfeeding experience prior to this delivery?: Yes  Feeding Feeding Type: Breast Fed  LATCH Score                   Interventions    Lactation Tools Discussed/Used WIC Program: No Initiated by:: Already initiated   Consult Status Consult Status: Follow-up Date: 08/27/18 Follow-up type: In-patient    Little Ishikawa 08/26/2018, 5:24 PM

## 2018-08-26 NOTE — Lactation Note (Signed)
This note was copied from a baby's chart. Lactation Consultation Note Checking w/mom to see if she was supplementing the baby w/BM or Formula.. RN had taken 22 cal. Similac to mom. Mom had supplemented w/curve tip syring. Asked mom is she had the supplemental guide, mom showed me the paper. Mom denied and questions or need of assistance. Asked mom if BF going well, mom stated yes. Denied painful latches. Encouraged to call for assistance or questions.   Patient Name: Debbie Hughes [id number] Date: 08/26/2018 Reason for consult: Follow-up assessment;Late-preterm 34-36.6wks   Maternal Data Formula Feeding for Exclusion: No Has patient been taught Hand Expression?: Yes Does the patient have breastfeeding experience prior to this delivery?: Yes  Feeding Feeding Type: Breast Fed  LATCH Score Latch: Grasps breast easily, tongue down, lips flanged, rhythmical sucking.  Audible Swallowing: A few with stimulation(baby falling asleep on breast on & off)  Type of Nipple: Everted at rest and after stimulation  Comfort (Breast/Nipple): Soft / non-tender  Hold (Positioning): No assistance needed to correctly position infant at breast.  LATCH Score: 9  Interventions    Lactation Tools Discussed/Used WIC Program: No Initiated by:: Already initiated   Consult Status Consult Status: Follow-up Date: 08/27/18 Follow-up type: In-patient    Theodoro Kalata 08/26/2018, 9:04 PM

## 2018-08-27 MED ORDER — IBUPROFEN 600 MG PO TABS: 600.0000 mg | ORAL_TABLET | Freq: Four times a day (QID) | ORAL | 0 refills | Status: DC

## 2018-08-27 NOTE — Procedures (Deleted)
      Informed consent was obtained from patient's mother, Ms.Debbie Hughes after explaining the risks, benefits and alternatives of the procedure including risks of bleeding, infection, damage to organs and baby possibly requiring more procedures in the future.  Patient received oral sucrose.  He was prepped.  Lidocaine was applied dorsally.  Patient was draped.  Circumcision perfomed with Mogan clamp in usual fashion.  Moistened foam applied over penis.  Patient tolerated procedure.  EBL: minimal.  Complications: None.  Dr. Alesia Richards. 08/27/2018.  1224.

## 2018-08-27 NOTE — Discharge Summary (Addendum)
SVD OB Discharge Summary     Patient Name: Debbie Hughes DOB: 02/21/1991 MRN: [id number]  Date of admission: 08/25/2018 Delivering MD: [name]  Date of delivery: 08/25/2018 Type of delivery: SVD  Newborn Data: Sex: Baby female  Circumcision: to be completed in pt before discharge today by Dr Alesia Richards.   Live born female  Birth Weight: 5 lb 0.6 oz (2285 g) APGAR: 9, 9  Newborn Delivery   Birth date/time:  08/25/2018 23:22:00 Delivery type:  Vaginal, Spontaneous     Feeding: breast Infant to be discharge to home with mother in stable condition.  Possibility infant may need to stay for being underweight.   Admitting diagnosis: 36 WKS, CTX, LEAKING FLUIDS,VOMITING  Intrauterine pregnancy: [redacted]w[redacted]d     Secondary diagnosis:  Active Problems:   Preterm labor   Indication for care in labor or delivery   Normal postpartum course                                Complications: None                                                              Intrapartum Procedures: spontaneous vaginal delivery, preterm. Postpartum Procedures: none Complications-Operative and Postpartum: none Augmentation: None   History of Present Illness: Ms. Debbie Hughes is a 28 y.o. female, [phone], who presents at [redacted]w[redacted]d weeks gestation. The patient has been followed at  [facility]  Her pregnancy has been complicated by:  Patient Active Problem List   Diagnosis Date Noted  . Normal postpartum course 08/27/2018  . Indication for care in labor or delivery 08/26/2018  . Nausea and vomiting during pregnancy 08/16/2018  . Abdominal pain during pregnancy in third trimester 08/16/2018  . Emesis 08/16/2018  . Preterm contractions 08/16/2018  . Preterm labor 07/25/2017  . NSVD (normal spontaneous vaginal delivery) 07/25/2017  . Pregnant 12/17/2016  . Breast cyst, left (excised in 2007) 11/23/2016  . Hyperemesis gravidarum 11/22/2016  . Acute generalized abdominal pain  11/22/2016  . Allergy to penicillin 09/22/2013  . Hx of ectopic pregnancy---s/p left salpingectomy 03/2013 09/22/2013    Hospital course:  Onset of Labor With Vaginal Delivery     28 y.o. yo [phone] at [redacted]w[redacted]d was admitted in Active Labor , Preterm @ 36.2 on 08/25/2018. Patient had an uncomplicated labor course as follows:  Membrane Rupture Time/Date: 8:00 PM ,08/25/2018   Intrapartum Procedures: Episiotomy: None [1]                                         Lacerations:  None [1]  Patient had a delivery of a Viable infant. 08/25/2018  Information for the patient's newborn:  Debbie, Hughes [[id number]]  Delivery Method: Vag-Spont    Pateint had an uncomplicated postpartum course.  She is ambulating, tolerating a regular diet, passing flatus, and urinating well. Patient is discharged home in stable condition on 08/27/18.  Postpartum Day # 2 : S/P NSVD due to precipitous delivery upon arrival to the hospital. Patient up ad lib, denies syncope or dizziness. Reports consuming regular diet without issues  and denies N/V. Patient reports 0 bowel movement + passing flatus.  Denies issues with urination and reports bleeding is "light."  Patient is breastfeeding and reports going well.  Desires IUD for postpartum contraception.  Pain is being appropriately managed with use of po meds. Pt had N/V post delivery, which resolved with IV phenergan and Zofran and tordal. Pt stable without complaints now.   Physical exam  Vitals:   08/26/18 0748 08/26/18 1100 08/26/18 1528 08/26/18 2230  BP: 114/67 120/72 118/78 116/68  Pulse: 75 79 85 79  Resp: 16 14 15 18   Temp: 98.4 F (36.9 C) 99 F (37.2 C) 98.9 F (37.2 C) 98.6 F (37 C)  TempSrc: Oral Oral Oral Oral  SpO2: 100% 100% 100%   Weight:      Height:       General: alert, cooperative and no distress Lochia: appropriate Uterine Fundus: firm Perineum: Approximate, intact, no hematoma.  DVT Evaluation: No evidence of DVT seen on physical  exam. Negative Homan's sign. No cords or calf tenderness. No significant calf/ankle edema.  Labs: Lab Results  Component Value Date   WBC 12.9 (H) 08/26/2018   HGB 12.0 08/26/2018   HCT 34.7 (L) 08/26/2018   MCV 96.1 08/26/2018   PLT 232 08/26/2018   CMP Latest Ref Rng & Units 08/16/2018  Glucose 70 - 99 mg/dL 92  BUN 6 - 20 mg/dL 5(L)  Creatinine 0.44 - 1.00 mg/dL 0.53  Sodium 135 - 145 mmol/L 135  Potassium 3.5 - 5.1 mmol/L 3.0(L)  Chloride 98 - 111 mmol/L 103  CO2 22 - 32 mmol/L 19(L)  Calcium 8.9 - 10.3 mg/dL 8.8(L)  Total Protein 6.5 - 8.1 g/dL 6.3(L)  Total Bilirubin 0.3 - 1.2 mg/dL 0.6  Alkaline Phos 38 - 126 U/L 117  AST 15 - 41 U/L 28  ALT 0 - 44 U/L 15    Date of discharge: 08/27/2018 Discharge Diagnoses: Premature labor and and delivery Discharge instruction: per After Visit Summary and "Baby and Me Booklet".  After visit meds:  Allergies as of 08/27/2018      Reactions   Penicillins Rash   Has patient had a PCN reaction causing immediate rash, facial/tongue/throat swelling, SOB or lightheadedness with hypotension: yes Has patient had a PCN reaction causing severe rash involving mucus membranes or skin necrosis: no Has patient had a PCN reaction that required hospitalization no Has patient had a PCN reaction occurring within the last 10 years: yes If all of the above answers are "NO", then may proceed with Cephalosporin use.      Medication List    STOP taking these medications   Doxylamine-Pyridoxine 10-10 MG Tbec Commonly known as:  Diclegis   metoCLOPramide 10 MG tablet Commonly known as:  Reglan     TAKE these medications   ibuprofen 600 MG tablet Commonly known as:  ADVIL Take 1 tablet (600 mg total) by mouth every 6 (six) hours.   ondansetron 8 MG disintegrating tablet Commonly known as:  Zofran ODT Take 1 tablet (8 mg total) by mouth every 8 (eight) hours as needed for nausea or vomiting.   promethazine 25 MG tablet Commonly known as:   PHENERGAN Take 1 tablet (25 mg total) by mouth at bedtime as needed.       Activity:           unrestricted and pelvic rest Advance as tolerated. Pelvic rest for 6 weeks.  Diet:  routine Medications: PNV, Ibuprofen and Colace Postpartum contraception: IUD Mirena Condition:  Pt discharge to home with baby in stable condition at the time I was in the room, pt stated peds has kept the other children one extra day due to being under weight, pt not aware if this will happen or note. Possibility of infant needing to stay. If so pt to room in.  Baby Female: Needs circ in pt prior to be discharged today by Dr Alesia Richards.   Meds: Allergies as of 08/27/2018      Reactions   Penicillins Rash   Has patient had a PCN reaction causing immediate rash, facial/tongue/throat swelling, SOB or lightheadedness with hypotension: yes Has patient had a PCN reaction causing severe rash involving mucus membranes or skin necrosis: no Has patient had a PCN reaction that required hospitalization no Has patient had a PCN reaction occurring within the last 10 years: yes If all of the above answers are "NO", then may proceed with Cephalosporin use.      Medication List    STOP taking these medications   Doxylamine-Pyridoxine 10-10 MG Tbec Commonly known as:  Diclegis   metoCLOPramide 10 MG tablet Commonly known as:  Reglan     TAKE these medications   ibuprofen 600 MG tablet Commonly known as:  ADVIL Take 1 tablet (600 mg total) by mouth every 6 (six) hours.   ondansetron 8 MG disintegrating tablet Commonly known as:  Zofran ODT Take 1 tablet (8 mg total) by mouth every 8 (eight) hours as needed for nausea or vomiting.   promethazine 25 MG tablet Commonly known as:  PHENERGAN Take 1 tablet (25 mg total) by mouth at bedtime as needed.       Discharge Follow Up:  [facility] Follow up in 6 week(s).   Specialty:  Obstetrics and  Gynecology Contact information: [address] [place] [address] [phone]          Dr. Alesia Richards to be updated on patient status and assume care @ [place], NP-C, [place] 08/27/2018, 2:06 AM  Noralyn Pick, [place]  -I discussed with patient risks, benefits and alternatives of circumcision including risks of bleeding, infection, damage to organs and need for further procedures.  All her questions were answered and she consented for the procedure to be done on her son. Dr. Alesia Richards 08/27/2018.

## 2018-08-27 NOTE — Progress Notes (Signed)
Patient reports feeling better, able to tolerate solid foods, drinking fluids without nausea. Able to tolerate PO meds. IV saline locked @ 2230.

## 2018-08-28 ENCOUNTER — Ambulatory Visit: Payer: Self-pay

## 2018-08-28 NOTE — Lactation Note (Signed)
This note was copied from a baby's chart. Lactation Consultation Note  Patient Name: Debbie Hughes RCBUL'A Date: 08/28/2018 Reason for consult: Follow-up assessment;Late-preterm 34-36.6wks Baby is 58 hours old/5% weight loss.  Baby is breastfeeding well and no concerns from mom.  Discussed milk coming to volume and the prevention and treatment of engorgement.  She has a breast pump at home.  Breasts are feeling heavier.  Reviewed waking techniques and using good breast massage during feeding.  Reviewed lactation outpatient services and encouraged to call prn.  Maternal Data    Feeding Feeding Type: Breast Fed  LATCH Score Latch: Grasps breast easily, tongue down, lips flanged, rhythmical sucking.  Audible Swallowing: A few with stimulation  Type of Nipple: Everted at rest and after stimulation  Comfort (Breast/Nipple): Soft / non-tender  Hold (Positioning): Assistance needed to correctly position infant at breast and maintain latch.  LATCH Score: 8  Interventions    Lactation Tools Discussed/Used     Consult Status Consult Status: Complete Follow-up type: Call as needed    Ave Filter 08/28/2018, 9:47 AM

## 2018-11-07 ENCOUNTER — Observation Stay (HOSPITAL_COMMUNITY)
Admission: EM | Admit: 2018-11-07 | Discharge: 2018-11-09 | Disposition: A | Discharge status: Home / Self Care | Payer: BC Managed Care – PPO | Attending: Internal Medicine | Admitting: Internal Medicine

## 2018-11-07 DIAGNOSIS — E871 Hypo-osmolality and hyponatremia: Secondary | ICD-10-CM | POA: Diagnosis present

## 2018-11-07 DIAGNOSIS — Z20828 Contact with and (suspected) exposure to other viral communicable diseases: Secondary | ICD-10-CM | POA: Insufficient documentation

## 2018-11-07 DIAGNOSIS — Z3202 Encounter for pregnancy test, result negative: Secondary | ICD-10-CM | POA: Diagnosis not present

## 2018-11-07 DIAGNOSIS — R1013 Epigastric pain: Secondary | ICD-10-CM | POA: Diagnosis not present

## 2018-11-07 DIAGNOSIS — R0789 Other chest pain: Secondary | ICD-10-CM | POA: Insufficient documentation

## 2018-11-07 DIAGNOSIS — R112 Nausea with vomiting, unspecified: Secondary | ICD-10-CM | POA: Diagnosis present

## 2018-11-07 DIAGNOSIS — E872 Acidosis: Secondary | ICD-10-CM | POA: Diagnosis not present

## 2018-11-07 DIAGNOSIS — E876 Hypokalemia: Secondary | ICD-10-CM | POA: Diagnosis present

## 2018-11-07 DIAGNOSIS — D72829 Elevated white blood cell count, unspecified: Secondary | ICD-10-CM | POA: Diagnosis not present

## 2018-11-07 LAB — URINALYSIS, ROUTINE W REFLEX MICROSCOPIC
Bacteria, UA: NONE SEEN
Bilirubin Urine: NEGATIVE
Glucose, UA: NEGATIVE mg/dL
Ketones, ur: 80 mg/dL — AB
Leukocytes,Ua: NEGATIVE
Nitrite: NEGATIVE
Protein, ur: 30 mg/dL — AB
Specific Gravity, Urine: 1.018 (ref 1.005–1.030)
pH: 9 — ABNORMAL HIGH (ref 5.0–8.0)

## 2018-11-07 LAB — COMPREHENSIVE METABOLIC PANEL
ALT: 22 U/L (ref 0–44)
AST: 30 U/L (ref 15–41)
Albumin: 4.9 g/dL (ref 3.5–5.0)
Alkaline Phosphatase: 67 U/L (ref 38–126)
Anion gap: 16 — ABNORMAL HIGH (ref 5–15)
BUN: 13 mg/dL (ref 6–20)
CO2: 14 mmol/L — ABNORMAL LOW (ref 22–32)
Calcium: 9.5 mg/dL (ref 8.9–10.3)
Chloride: 102 mmol/L (ref 98–111)
Creatinine, Ser: 0.86 mg/dL (ref 0.44–1.00)
GFR calc Af Amer: >60 mL/min (ref >60)
GFR calc non Af Amer: >60 mL/min (ref >60)
Glucose, Bld: 119 mg/dL — ABNORMAL HIGH (ref 70–99)
Potassium: 3.3 mmol/L — ABNORMAL LOW (ref 3.5–5.1)
Sodium: 132 mmol/L — ABNORMAL LOW (ref 135–145)
Total Bilirubin: 1.3 mg/dL — ABNORMAL HIGH (ref 0.3–1.2)
Total Protein: 8.2 g/dL — ABNORMAL HIGH (ref 6.5–8.1)

## 2018-11-07 LAB — CBC WITH DIFFERENTIAL/PLATELET
Abs Immature Granulocytes: 0.05 10*3/uL (ref 0.00–0.07)
Basophils Absolute: 0 10*3/uL (ref 0.0–0.1)
Basophils Relative: 0 %
Eosinophils Absolute: 0 10*3/uL (ref 0.0–0.5)
Eosinophils Relative: 0 %
HCT: 42.9 % (ref 36.0–46.0)
Hemoglobin: 14.5 g/dL (ref 12.0–15.0)
Immature Granulocytes: 0 %
Lymphocytes Relative: 4 %
Lymphs Abs: 0.5 10*3/uL — ABNORMAL LOW (ref 0.7–4.0)
MCH: 32.7 pg (ref 26.0–34.0)
MCHC: 33.8 g/dL (ref 30.0–36.0)
MCV: 96.6 fL (ref 80.0–100.0)
Monocytes Absolute: 0.3 10*3/uL (ref 0.1–1.0)
Monocytes Relative: 2 %
Neutro Abs: 12.5 10*3/uL — ABNORMAL HIGH (ref 1.7–7.7)
Neutrophils Relative %: 94 %
Platelets: 223 10*3/uL (ref 150–400)
RBC: 4.44 MIL/uL (ref 3.87–5.11)
RDW: 11.3 % — ABNORMAL LOW (ref 11.5–15.5)
WBC: 13.3 10*3/uL — ABNORMAL HIGH (ref 4.0–10.5)
nRBC: 0 % (ref 0.0–0.2)

## 2018-11-07 LAB — I-STAT BETA HCG BLOOD, ED (MC, WL, AP ONLY): I-stat hCG, quantitative: <5 m[IU]/mL (ref <5)

## 2018-11-07 LAB — LIPASE, BLOOD: Lipase: 35 U/L (ref 11–51)

## 2018-11-07 MED ORDER — DIPHENHYDRAMINE HCL 50 MG/ML IJ SOLN
12.5000 mg | Freq: Once | INTRAMUSCULAR | Status: AC
  Administered 2018-11-07: 12.5 mg via INTRAVENOUS
  Filled 2018-11-07: qty 1

## 2018-11-07 MED ORDER — ONDANSETRON 4 MG PO TBDP: 4.0000 mg | ORAL_TABLET | Freq: Three times a day (TID) | ORAL | 0 refills | Status: DC | PRN

## 2018-11-07 MED ORDER — METOCLOPRAMIDE HCL 5 MG/ML IJ SOLN
10.0000 mg | Freq: Once | INTRAMUSCULAR | Status: AC
  Administered 2018-11-07: 10 mg via INTRAVENOUS
  Filled 2018-11-07: qty 2

## 2018-11-07 MED ORDER — SODIUM CHLORIDE 0.9 % IV BOLUS
1000.0000 mL | Freq: Once | INTRAVENOUS | Status: AC
  Administered 2018-11-07: 1000 mL via INTRAVENOUS

## 2018-11-07 MED ORDER — LACTATED RINGERS IV BOLUS
1000.0000 mL | Freq: Once | INTRAVENOUS | Status: AC
  Administered 2018-11-07: 1000 mL via INTRAVENOUS

## 2018-11-07 MED ORDER — PROCHLORPERAZINE EDISYLATE 10 MG/2ML IJ SOLN
10.0000 mg | Freq: Once | INTRAMUSCULAR | Status: AC
  Administered 2018-11-07: 10 mg via INTRAVENOUS
  Filled 2018-11-07: qty 2

## 2018-11-07 MED ORDER — FAMOTIDINE IN NACL 20-0.9 MG/50ML-% IV SOLN
20.0000 mg | Freq: Once | INTRAVENOUS | Status: AC
  Administered 2018-11-07: 20 mg via INTRAVENOUS
  Filled 2018-11-07: qty 50

## 2018-11-07 MED ORDER — DICYCLOMINE HCL 10 MG PO CAPS: 20.0000 mg | ORAL_CAPSULE | Freq: Once | ORAL | Status: DC

## 2018-11-07 MED ORDER — DIPHENHYDRAMINE HCL 50 MG/ML IJ SOLN
25.0000 mg | Freq: Once | INTRAMUSCULAR | Status: AC
  Administered 2018-11-07: 25 mg via INTRAVENOUS
  Filled 2018-11-07: qty 1

## 2018-11-07 MED ORDER — ONDANSETRON 4 MG PO TBDP
4.0000 mg | ORAL_TABLET | Freq: Once | ORAL | Status: AC
  Administered 2018-11-07: 4 mg via ORAL
  Filled 2018-11-07: qty 1

## 2018-11-07 NOTE — Discharge Instructions (Addendum)
Try zantac or pepcid twice a day.  Try to avoid things that may make this worse, most commonly these are spicy foods tomato based products fatty foods chocolate and peppermint.  Alcohol and tobacco can also make this worse.  Return to the emergency department for sudden worsening pain fever or inability to eat or drink. Use zofran for nausea.

## 2018-11-07 NOTE — ED Notes (Signed)
Pt continues to have nausea and vomiting.  [name], PA made aware.

## 2018-11-07 NOTE — ED Triage Notes (Signed)
Pt became nauseous last night & began vomiting since this morning. She states that she has not held anything down at all. Abdominal pain in the upper Lt & Rt quadrant.

## 2018-11-07 NOTE — ED Notes (Signed)
Upon discharge pt starting vomiting again. PA informed. Orders placed

## 2018-11-07 NOTE — ED Provider Notes (Addendum)
[facility]ER[facility] DEPARTMENT Provider Note   CSN: [id number] Arrival date & time: 11/07/18  1721    History   Chief Complaint Chief Complaint  Patient presents with  . Abdominal Pain    HPI Debbie Hughes is a 28 y.o. female.     28 yo F with a chief complaint of diffuse crampy abdominal pain nausea and vomiting.  This been going on since this morning.  Subjective fevers and chills.  No diarrhea or constipation.  Having some increased urinary frequency.  Not able to keep anything down today.  Had a spontaneous vaginal delivery about 3 months ago.  No issues postpartum.  The history is provided by the patient.  Abdominal Pain Associated symptoms: nausea and vomiting   Associated symptoms: no chest pain, no chills, no dysuria, no fever and no shortness of breath   Illness Severity:  Moderate Onset quality:  Gradual Duration:  12 hours Timing:  Constant Progression:  Worsening Chronicity:  Recurrent Associated symptoms: abdominal pain, nausea and vomiting   Associated symptoms: no chest pain, no congestion, no fever, no headaches, no myalgias, no rhinorrhea, no shortness of breath and no wheezing     Past Medical History:  Diagnosis Date  . Breast mass in female 01/21/06  . Breast nodule 04/26/10   Left  . BV (bacterial vaginosis) 5.10/11  . Candida vaginitis 04/26/10  . Ectopic pregnancy 03/24/2013  . Fibroadenoma   . H/O varicella   . History of chlamydia   . Irregular menstrual cycle 07/28/07  . 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 Vaginal discharge 10/01/07  . Vaginitis     Patient Active Problem List   Diagnosis Date Noted  . Intractable nausea and vomiting 11/08/2018  . Hypokalemia 11/08/2018  . Leukocytosis 11/08/2018  . Hyponatremia 11/08/2018  . Normal postpartum course 08/27/2018  . Indication for care in labor or delivery 08/26/2018  . Nausea and vomiting during pregnancy 08/16/2018  .  Abdominal pain during pregnancy in third trimester 08/16/2018  . Emesis 08/16/2018  . Preterm contractions 08/16/2018  . Preterm labor 07/25/2017  . NSVD (normal spontaneous vaginal delivery) 07/25/2017  . Pregnant 12/17/2016  . Breast cyst, left (excised in 2007) 11/23/2016  . Hyperemesis gravidarum 11/22/2016  . Acute generalized abdominal pain 11/22/2016  . Allergy to penicillin 09/22/2013  . Hx of ectopic pregnancy---s/p left salpingectomy 03/2013 09/22/2013    Past Surgical History:  Procedure Laterality Date  . BREAST CYST EXCISION Left 2007  . DILATION AND CURETTAGE OF UTERUS    . LAPAROSCOPY N/A 03/24/2013   Procedure: LAPAROSCOPY OPERATIVE;  Surgeon: Betsy Coder, MD;  Location: [facility];  Service: Gynecology;  Laterality: N/A;  . Left Salpingectomy  03/24/2013   Ectopic Pregnancy  . UNILATERAL SALPINGECTOMY Left 03/24/2013   Procedure: UNILATERAL SALPINGECTOMY;  Surgeon: Betsy Coder, MD;  Location: [facility];  Service: Gynecology;  Laterality: Left;     OB History    Gravida  5   Para  3   Term  2   Preterm  1   AB  2   Living  3     SAB  1   TAB      Ectopic  1   Multiple  0   Live Births  3            Home Medications    Prior to Admission medications   Medication Sig Start Date End Date Taking? Authorizing Provider  ondansetron (ZOFRAN ODT) 4 MG disintegrating tablet Take 1 tablet (4 mg total) by mouth every 8 (eight) hours as needed for nausea or vomiting. 11/07/18   Deno Etienne, DO    Family History Family History  Problem Relation Age of Onset  . Asthma Maternal Grandmother   . Hypertension Maternal Grandmother   . Diabetes Maternal Grandmother   . Hypertension Paternal Grandfather   . Diabetes Paternal Grandfather     Social History Social History   Tobacco Use  . Smoking status: Former Research scientist (life sciences)  . Smokeless tobacco: Never Used  Substance Use Topics  . Alcohol use: No  . Drug use: No     Allergies   Penicillins    Review of Systems Review of Systems  Constitutional: Negative for chills and fever.  HENT: Negative for congestion and rhinorrhea.   Eyes: Negative for redness and visual disturbance.  Respiratory: Negative for shortness of breath and wheezing.   Cardiovascular: Negative for chest pain and palpitations.  Gastrointestinal: Positive for abdominal pain, nausea and vomiting.  Genitourinary: Negative for dysuria and urgency.  Musculoskeletal: Negative for arthralgias and myalgias.  Skin: Negative for pallor and wound.  Neurological: Negative for dizziness and headaches.     Physical Exam Updated Vital Signs BP 120/90 (BP Location: Right Arm)   Pulse 98   Temp 98.5 F (36.9 C) (Oral)   Resp 16   Wt 55.6 kg   SpO2 100%   BMI 21.71 kg/m   Physical Exam Vitals signs and nursing note reviewed.  Constitutional:      General: She is not in acute distress.    Appearance: She is well-developed. She is not diaphoretic.  HENT:     Head: Normocephalic and atraumatic.  Eyes:     Pupils: Pupils are equal, round, and reactive to light.  Neck:     Musculoskeletal: Normal range of motion and neck supple.  Cardiovascular:     Rate and Rhythm: Normal rate and regular rhythm.     Heart sounds: No murmur. No friction rub. No gallop.   Pulmonary:     Effort: Pulmonary effort is normal.     Breath sounds: No wheezing or rales.  Abdominal:     General: There is no distension.     Palpations: Abdomen is soft.     Tenderness: There is generalized abdominal tenderness (diffuse without focality).  Musculoskeletal:        General: No tenderness.  Skin:    General: Skin is warm and dry.  Neurological:     Mental Status: She is alert and oriented to person, place, and time.  Psychiatric:        Behavior: Behavior normal.      ED Treatments / Results  Labs (all labs ordered are listed, but only abnormal results are displayed) Labs Reviewed  CBC WITH DIFFERENTIAL/PLATELET - Abnormal;  Notable for the following components:      Result Value   WBC 13.3 (*)    RDW 11.3 (*)    Neutro Abs 12.5 (*)    Lymphs Abs 0.5 (*)    All other components within normal limits  COMPREHENSIVE METABOLIC PANEL - Abnormal; Notable for the following components:   Sodium 132 (*)    Potassium 3.3 (*)    CO2 14 (*)    Glucose, Bld 119 (*)    Total Protein 8.2 (*)    Total Bilirubin 1.3 (*)    Anion gap 16 (*)    All other components within normal  limits  URINALYSIS, ROUTINE W REFLEX MICROSCOPIC - Abnormal; Notable for the following components:   pH 9.0 (*)    Hgb urine dipstick SMALL (*)    Ketones, ur 80 (*)    Protein, ur 30 (*)    All other components within normal limits  CBC - Abnormal; Notable for the following components:   WBC 12.1 (*)    All other components within normal limits  BASIC METABOLIC PANEL - Abnormal; Notable for the following components:   Sodium 133 (*)    CO2 18 (*)    Glucose, Bld 106 (*)    All other components within normal limits  SARS CORONAVIRUS 2 (HOSPITAL ORDER, [facility])  LIPASE, BLOOD  RAPID URINE DRUG SCREEN, HOSP PERFORMED  HIV ANTIBODY (ROUTINE TESTING W REFLEX)  I-STAT BETA HCG BLOOD, ED ([facility], [facility], [facility] ONLY)    EKG None  Radiology Ct Abdomen Pelvis W Contrast  Result Date: 11/08/2018 CLINICAL DATA:  Abdominal pain EXAM: CT ABDOMEN AND PELVIS WITH CONTRAST TECHNIQUE: Multidetector CT imaging of the abdomen and pelvis was performed using the standard protocol following bolus administration of intravenous contrast. CONTRAST:  180mL OMNIPAQUE IOHEXOL 300 MG/ML  SOLN COMPARISON:  02/28/2016 FINDINGS: Lower chest: No acute abnormality. Hepatobiliary: No focal liver abnormality is seen. No gallstones, gallbladder wall thickening, or biliary dilatation. Pancreas: Unremarkable. No pancreatic ductal dilatation or surrounding inflammatory changes. Spleen: Normal in size without focal abnormality. Adrenals/Urinary Tract: Adrenal  glands are normal. No hydronephrosis. Subcentimeter hypodensity in the cortex of mid right kidney too small to further characterize. Small stones in the mid left kidney measuring up to 5 mm. Bladder is slightly thick walled. Stomach/Bowel: The stomach is nonenlarged. No dilated small bowel. Negative appendix. No bowel wall thickening Vascular/Lymphatic: No significant vascular findings are present. No enlarged abdominal or pelvic lymph nodes. Reproductive: Uterus and bilateral adnexa are unremarkable. Other: No free air.  Small free fluid in the pelvis. Musculoskeletal: No acute or significant osseous findings. IMPRESSION: 1. No CT evidence for acute intra-abdominal or pelvic abnormality. 2. Small stones in the left kidney. 3. Small free fluid in the pelvis Electronically Signed   By: Donavan Foil M.D.   On: 11/08/2018 02:26    Procedures Procedures (including critical care time)  Medications Ordered in ED Medications  enoxaparin (LOVENOX) injection 40 mg (has no administration in time range)  hydrALAZINE (APRESOLINE) injection 10 mg (10 mg Intravenous Given 11/08/18 0237)  acetaminophen (TYLENOL) tablet 650 mg (has no administration in time range)    Or  acetaminophen (TYLENOL) suppository 650 mg (has no administration in time range)  prochlorperazine (COMPAZINE) injection 10 mg (10 mg Intravenous Given 11/08/18 0409)  0.9 % NaCl with KCl 20 mEq/ L  infusion ( Intravenous Rate/Dose Verify 11/08/18 0538)  pantoprazole (PROTONIX) injection 40 mg (has no administration in time range)  ondansetron (ZOFRAN) injection 4 mg (4 mg Intravenous Given 11/08/18 0236)  sodium chloride 0.9 % bolus 1,000 mL (1,000 mLs Intravenous Bolus from Bag 11/07/18 1821)  prochlorperazine (COMPAZINE) injection 10 mg (10 mg Intravenous Given 11/07/18 1825)  diphenhydrAMINE (BENADRYL) injection 25 mg (25 mg Intravenous Given 11/07/18 1825)  famotidine (PEPCID) IVPB 20 mg premix (20 mg Intravenous Bolus from Bag 11/07/18 1822)   ondansetron (ZOFRAN-ODT) disintegrating tablet 4 mg (4 mg Oral Given 11/07/18 1950)  lactated ringers bolus 1,000 mL (1,000 mLs Intravenous New Bag/Given 11/07/18 2021)  metoCLOPramide (REGLAN) injection 10 mg (10 mg Intravenous Given 11/07/18 2302)  diphenhydrAMINE (BENADRYL) injection  12.5 mg (12.5 mg Intravenous Given 11/07/18 2301)  diphenhydrAMINE (BENADRYL) injection 25 mg (25 mg Intravenous Given 11/08/18 0122)  iohexol (OMNIPAQUE) 300 MG/ML solution 100 mL (100 mLs Intravenous Contrast Given 11/08/18 0148)     Initial Impression / Assessment and Plan / ED Course  I have reviewed the triage vital signs and the nursing notes.  Pertinent labs & imaging results that were available during my care of the patient were reviewed by me and considered in my medical decision making (see chart for details).  Clinical Course as of Nov 08 706  Sat Nov 07, 2018  2307 WBC(!): 13.3 [CG]  2307 Potassium(!): 3.3 [CG]  2307 Total Bilirubin(!): 1.3 [CG]  2307 Ketones, ur(!): 80 [CG]    Clinical Course User Index [CG] Kinnie Feil, PA-C       28 yo F with a chief complaint of nausea and vomiting.  Going on since this morning.  Associated with diffuse crampy abdominal pain.  Patient has a history of the same with prior pregnancy.  She just had a child 3 months ago though has not elected to be on birth control.  She has been pregnant 5 times before this.  Will obtain lab work give fluids and antiemetics and reassess.  Awaiting for CMP and lipase and urine to return.  She has a mild leukocytosis which could be due to vomiting. Patient feeling much better on reassessment.  Tolerating p.o. by mouth.  Requesting be discharged home.  Have her follow-up with her family doctor.  The patients results and plan were reviewed and discussed.   Any x-rays performed were independently reviewed by myself.   Differential diagnosis were considered with the presenting HPI.  Medications  enoxaparin (LOVENOX) injection  40 mg (has no administration in time range)  hydrALAZINE (APRESOLINE) injection 10 mg (10 mg Intravenous Given 11/08/18 0237)  acetaminophen (TYLENOL) tablet 650 mg (has no administration in time range)    Or  acetaminophen (TYLENOL) suppository 650 mg (has no administration in time range)  prochlorperazine (COMPAZINE) injection 10 mg (10 mg Intravenous Given 11/08/18 0409)  0.9 % NaCl with KCl 20 mEq/ L  infusion ( Intravenous Rate/Dose Verify 11/08/18 0538)  pantoprazole (PROTONIX) injection 40 mg (has no administration in time range)  ondansetron (ZOFRAN) injection 4 mg (4 mg Intravenous Given 11/08/18 0236)  sodium chloride 0.9 % bolus 1,000 mL (1,000 mLs Intravenous Bolus from Bag 11/07/18 1821)  prochlorperazine (COMPAZINE) injection 10 mg (10 mg Intravenous Given 11/07/18 1825)  diphenhydrAMINE (BENADRYL) injection 25 mg (25 mg Intravenous Given 11/07/18 1825)  famotidine (PEPCID) IVPB 20 mg premix (20 mg Intravenous Bolus from Bag 11/07/18 1822)  ondansetron (ZOFRAN-ODT) disintegrating tablet 4 mg (4 mg Oral Given 11/07/18 1950)  lactated ringers bolus 1,000 mL (1,000 mLs Intravenous New Bag/Given 11/07/18 2021)  metoCLOPramide (REGLAN) injection 10 mg (10 mg Intravenous Given 11/07/18 2302)  diphenhydrAMINE (BENADRYL) injection 12.5 mg (12.5 mg Intravenous Given 11/07/18 2301)  diphenhydrAMINE (BENADRYL) injection 25 mg (25 mg Intravenous Given 11/08/18 0122)  iohexol (OMNIPAQUE) 300 MG/ML solution 100 mL (100 mLs Intravenous Contrast Given 11/08/18 0148)    Vitals:   11/08/18 0045 11/08/18 0130 11/08/18 0225 11/08/18 0407  BP: 138/86 (!) 147/82 (!) 140/111 120/90  Pulse:  75 87 98  Resp:  18 16 16   Temp:   98.2 F (36.8 C) 98.5 F (36.9 C)  TempSrc:   Oral Oral  SpO2:  96% 100% 100%  Weight:   55.6 kg  Final diagnoses:  Nausea and vomiting in adult    Admission/ observation were discussed with the admitting physician, patient and/or family and they are comfortable with the plan.    Final  Clinical Impressions(s) / ED Diagnoses   Final diagnoses:  Nausea and vomiting in adult    ED Discharge Orders         Ordered    ondansetron (ZOFRAN ODT) 4 MG disintegrating tablet  Every 8 hours PRN     11/07/18 1907           Deno Etienne, DO 11/07/18 [place], Rowena, DO 11/08/18 [phone]

## 2018-11-07 NOTE — ED Provider Notes (Cosign Needed Addendum)
Physical Exam  BP 121/70 (BP Location: Right Arm)   Pulse 66   Temp 97.6 F (36.4 C) (Oral)   Resp 18   SpO2 100%   Assumed care from Dr. Deno Etienne at [phone]. Briefly, the patient is a 28 y.o. female with PMHx of  has a past medical history of Breast mass in female (01/21/06), Breast nodule (04/26/10), BV (bacterial vaginosis) (5.10/11), Candida vaginitis (04/26/10), Ectopic pregnancy (03/24/2013), Fibroadenoma, H/O varicella, History of chlamydia, Irregular menstrual cycle (07/28/07), LGSIL (low grade squamous intraepithelial lesion) on Pap smear (06/22/07), Vaginal discharge (10/01/07), and Vaginitis. here with N/V.   Labs Reviewed  CBC WITH DIFFERENTIAL/PLATELET - Abnormal; Notable for the following components:      Result Value   WBC 13.3 (*)    RDW 11.3 (*)    Neutro Abs 12.5 (*)    Lymphs Abs 0.5 (*)    All other components within normal limits  COMPREHENSIVE METABOLIC PANEL - Abnormal; Notable for the following components:   Sodium 132 (*)    Potassium 3.3 (*)    CO2 14 (*)    Glucose, Bld 119 (*)    Total Protein 8.2 (*)    Total Bilirubin 1.3 (*)    Anion gap 16 (*)    All other components within normal limits  URINALYSIS, ROUTINE W REFLEX MICROSCOPIC - Abnormal; Notable for the following components:   pH 9.0 (*)    Hgb urine dipstick SMALL (*)    Ketones, ur 80 (*)    Protein, ur 30 (*)    All other components within normal limits  LIPASE, BLOOD  I-STAT BETA HCG BLOOD, ED ([facility], [facility], [facility] ONLY)    Course of Care:  Physical Exam Vitals signs and nursing note reviewed.  Constitutional:      General: She is not in acute distress.    Appearance: She is well-developed. She is not diaphoretic.     Comments: Nontoxic but appearing uncomfortable from nausea.   HENT:     Head: Normocephalic and atraumatic.  Eyes:     General:        Right eye: No discharge.        Left eye: No discharge.     Conjunctiva/sclera: Conjunctivae normal.     Comments: EOMs normal to gross  examination.  Neck:     Musculoskeletal: Normal range of motion.  Cardiovascular:     Rate and Rhythm: Normal rate and regular rhythm.     Comments: Intact, 2+ radial pulse. Abdominal:     General: There is no distension.     Tenderness: There is abdominal tenderness.     Comments: Epigastric tenderness w/o guarding or rebound.   Musculoskeletal: Normal range of motion.  Skin:    General: Skin is warm and dry.  Neurological:     Mental Status: She is alert.     Comments: Cranial nerves intact to gross observation. Patient moves extremities without difficulty.  Psychiatric:        Behavior: Behavior normal.        Thought Content: Thought content normal.        Judgment: Judgment normal.     ED Course/Procedures     Procedures  MDM   8:04 PM Reported from RN that patient is continuing to vomit after she was discharged and received ODT Zofran. Will give more fluids, antiemetics per IV.    10:30 PM patient had additional episode of profuse vomiting.  Patient resistant to admission, discussed 1 additional dose  of antiemetic, but anticipate admission for intractable nausea or vomiting due to this being her third antiemetic.  She is amenable to this plan if she continues to be symptomatic after third antiemetic.   Carmon Sails, PA-C to reassess after Reglan/Benadryl.    Albesa Seen, [place] 11/07/18 2307

## 2018-11-07 NOTE — ED Provider Notes (Addendum)
2315: Patient handed off to me by previous ED PA at shift change.  See her previous note for full H&P.  Briefly, patient is a 28 year old who presents with epigastric abdominal pain, persistent nausea, vomiting.  She was initially evaluated by ED MD who discharged her at 7 PM but at discharge continued vomiting so she was kept in the ER for repeat antiemetics, IV fluids.  She has received Reglan, Benadryl, Zofran, Compazine and IV fluids, Pepcid.  Plan is to reevaluate, p.o. challenge.  Consider admission for refractory nausea/vomiting or discharge if symptoms have improved.  Per previous team acute intra-abdominal/pelvic etiology is unlikely and imaging not thought to be indicated.  Evaluated patient.  She is found asleep, prone.  Difficult to arouse.  Reports improvement in nausea, vomiting and abdominal pain.  She cannot remember last time she vomited.  She has drank a cup of water.  She has persistent, but mild epigastric tenderness but no guarding, rigidity, rebound.  Negative Murphy's.  Have reviewed patient's labs which show mild hypokalemia, ketonuria, proteinuria and WBC 13.3 which could be secondary to dehydration, acute phase reactant.  LFT WNL, total bili minimally elevated. I considered RUQ [place] for persistent symptoms but given improvement in symptoms, recommended discharge.  Patient states that she would like to try and go home and sleep in her own bed.  I discussed that admission would be reasonable if she had persistent vomiting or pain but she states that she would like to try and go home.  Will DC with Pepcid, zofran, gentle diet.  Return precautions discussed.  Patient is comfortable with this.  0009: At discharge pt continued to have nausea, vomiting. Will consult medicine team for admission of refractory nausea, vomiting.    Kinnie Feil, PA-C 11/08/18 0009    Lennice Sites, DO 11/08/18 [id number]

## 2018-11-08 ENCOUNTER — Observation Stay (HOSPITAL_COMMUNITY): Payer: BC Managed Care – PPO

## 2018-11-08 DIAGNOSIS — D72829 Elevated white blood cell count, unspecified: Secondary | ICD-10-CM | POA: Diagnosis not present

## 2018-11-08 DIAGNOSIS — R112 Nausea with vomiting, unspecified: Secondary | ICD-10-CM | POA: Diagnosis not present

## 2018-11-08 DIAGNOSIS — E876 Hypokalemia: Secondary | ICD-10-CM | POA: Diagnosis present

## 2018-11-08 DIAGNOSIS — E871 Hypo-osmolality and hyponatremia: Secondary | ICD-10-CM

## 2018-11-08 LAB — RAPID URINE DRUG SCREEN, HOSP PERFORMED
Amphetamines: NOT DETECTED
Barbiturates: NOT DETECTED
Benzodiazepines: NOT DETECTED
Cocaine: NOT DETECTED
Opiates: NOT DETECTED
Tetrahydrocannabinol: POSITIVE — AB

## 2018-11-08 LAB — BASIC METABOLIC PANEL
Anion gap: 15 (ref 5–15)
BUN: 7 mg/dL (ref 6–20)
CO2: 18 mmol/L — ABNORMAL LOW (ref 22–32)
Calcium: 9.3 mg/dL (ref 8.9–10.3)
Chloride: 100 mmol/L (ref 98–111)
Creatinine, Ser: 0.85 mg/dL (ref 0.44–1.00)
GFR calc Af Amer: >60 mL/min (ref >60)
GFR calc non Af Amer: >60 mL/min (ref >60)
Glucose, Bld: 106 mg/dL — ABNORMAL HIGH (ref 70–99)
Potassium: 3.7 mmol/L (ref 3.5–5.1)
Sodium: 133 mmol/L — ABNORMAL LOW (ref 135–145)

## 2018-11-08 LAB — CBC
HCT: 40.4 % (ref 36.0–46.0)
Hemoglobin: 14.3 g/dL (ref 12.0–15.0)
MCH: 32.8 pg (ref 26.0–34.0)
MCHC: 35.4 g/dL (ref 30.0–36.0)
MCV: 92.7 fL (ref 80.0–100.0)
Platelets: 204 10*3/uL (ref 150–400)
RBC: 4.36 MIL/uL (ref 3.87–5.11)
RDW: 11.5 % (ref 11.5–15.5)
WBC: 12.1 10*3/uL — ABNORMAL HIGH (ref 4.0–10.5)
nRBC: 0 % (ref 0.0–0.2)

## 2018-11-08 LAB — HIV ANTIBODY (ROUTINE TESTING W REFLEX): HIV Screen 4th Generation wRfx: NONREACTIVE

## 2018-11-08 LAB — SARS CORONAVIRUS 2 BY RT PCR (HOSPITAL ORDER, PERFORMED IN ~~LOC~~ HOSPITAL LAB): SARS Coronavirus 2: NEGATIVE

## 2018-11-08 MED ORDER — IOHEXOL 300 MG/ML  SOLN
100.0000 mL | Freq: Once | INTRAMUSCULAR | Status: AC | PRN
  Administered 2018-11-08: 100 mL via INTRAVENOUS

## 2018-11-08 MED ORDER — ONDANSETRON HCL 4 MG/2ML IJ SOLN
4.0000 mg | Freq: Four times a day (QID) | INTRAMUSCULAR | Status: DC | PRN
  Administered 2018-11-08 – 2018-11-09 (×4): 4 mg via INTRAVENOUS
  Filled 2018-11-08 (×5): qty 2

## 2018-11-08 MED ORDER — HYOSCYAMINE SULFATE ER 0.375 MG PO TB12
0.3750 mg | ORAL_TABLET | Freq: Once | ORAL | Status: AC
  Administered 2018-11-08: 0.375 mg via ORAL
  Filled 2018-11-08: qty 1

## 2018-11-08 MED ORDER — POTASSIUM CHLORIDE IN NACL 20-0.9 MEQ/L-% IV SOLN
INTRAVENOUS | Status: DC
  Administered 2018-11-08 (×3): via INTRAVENOUS
  Filled 2018-11-08 (×4): qty 1000

## 2018-11-08 MED ORDER — PROCHLORPERAZINE EDISYLATE 10 MG/2ML IJ SOLN
10.0000 mg | Freq: Four times a day (QID) | INTRAMUSCULAR | Status: DC | PRN
  Administered 2018-11-08 – 2018-11-09 (×4): 10 mg via INTRAVENOUS
  Filled 2018-11-08 (×4): qty 2

## 2018-11-08 MED ORDER — ACETAMINOPHEN 650 MG RE SUPP: 650.0000 mg | Freq: Four times a day (QID) | RECTAL | Status: DC | PRN

## 2018-11-08 MED ORDER — HYDRALAZINE HCL 20 MG/ML IJ SOLN
10.0000 mg | INTRAMUSCULAR | Status: DC | PRN
  Administered 2018-11-08: 10 mg via INTRAVENOUS
  Filled 2018-11-08: qty 1

## 2018-11-08 MED ORDER — ACETAMINOPHEN 325 MG PO TABS
650.0000 mg | ORAL_TABLET | Freq: Four times a day (QID) | ORAL | Status: DC | PRN
  Administered 2018-11-08 – 2018-11-09 (×2): 650 mg via ORAL
  Filled 2018-11-08 (×2): qty 2

## 2018-11-08 MED ORDER — PANTOPRAZOLE SODIUM 40 MG IV SOLR
40.0000 mg | Freq: Two times a day (BID) | INTRAVENOUS | Status: DC
  Administered 2018-11-08 – 2018-11-09 (×3): 40 mg via INTRAVENOUS
  Filled 2018-11-08 (×3): qty 40

## 2018-11-08 MED ORDER — SODIUM CHLORIDE 0.9 % IV SOLN: INTRAVENOUS | Status: DC

## 2018-11-08 MED ORDER — DIPHENHYDRAMINE HCL 50 MG/ML IJ SOLN
25.0000 mg | Freq: Once | INTRAMUSCULAR | Status: AC
  Administered 2018-11-08: 25 mg via INTRAVENOUS
  Filled 2018-11-08: qty 1

## 2018-11-08 MED ORDER — ENOXAPARIN SODIUM 40 MG/0.4ML ~~LOC~~ SOLN
40.0000 mg | SUBCUTANEOUS | Status: DC
  Administered 2018-11-08: 40 mg via SUBCUTANEOUS
  Filled 2018-11-08 (×2): qty 0.4

## 2018-11-08 NOTE — ED Notes (Signed)
Dr. Tamala Julian in to assess pt at this time for admission

## 2018-11-08 NOTE — H&P (Addendum)
History and Physical    Debbie Hughes HGD:[id number] DOB: 1990/05/09 DOA: 11/07/2018  Referring MD/NP/PA: Gwenlyn Perking PCP: [name], PA-C  Patient coming from: Home  Chief Complaint: Vomiting  I have personally briefly reviewed patient's old medical records in [place]   HPI: Debbie Hughes is a 28 y.o. female with medical history significant of just recently giving birth on 08/25/2018; who presents with persistent vomiting since this morning.  She complains of some mid epigastric pain and mild nausea.  Emesis has been nonbloody and nonbilious in appearance.  Denies eating anything out of the norm for any recent sick contacts.  When she has had these symptoms in the past it was because she has been pregnant, but she is not currently pregnant.  Denies having any significant fever, chills, diarrhea, dysuria, or chest pain.  ED Course: Upon admission into the emergency department patient was noted to be afebrile with vital signs relatively within normal limits.  Labs revealed WBC 13.3 from 132, and potassium 3.3.  She had a urine pregnancy screen negative.  Patient had been given Benadryl, Pepcid, Zofran, Compazine, and Reglan without significant improvement of symptoms.  She wanted to stay and attempts that discharge have been made, but patient continued to have vomiting.  Review of Systems  Constitutional: Negative for chills, fever and weight loss.  HENT: Negative for ear discharge and nosebleeds.   Eyes: Negative for double vision and photophobia.  Respiratory: Negative for cough, shortness of breath and wheezing.   Cardiovascular: Negative for chest pain and leg swelling.  Gastrointestinal: Positive for abdominal pain, nausea and vomiting. Negative for diarrhea.  Genitourinary: Negative for dysuria and hematuria.  Musculoskeletal: Negative for joint pain and myalgias.  Skin: Negative for itching and rash.  Neurological: Negative for focal weakness and loss of  consciousness.  Psychiatric/Behavioral: Negative for substance abuse. The patient is not nervous/anxious.     Past Medical History:  Diagnosis Date  . Breast mass in female 01/21/06  . Breast nodule 04/26/10   Left  . BV (bacterial vaginosis) 5.10/11  . Candida vaginitis 04/26/10  . Ectopic pregnancy 03/24/2013  . Fibroadenoma   . H/O varicella   . History of chlamydia   . Irregular menstrual cycle 07/28/07  . 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 Vaginal discharge 10/01/07  . Vaginitis     Past Surgical History:  Procedure Laterality Date  . BREAST CYST EXCISION Left 2007  . DILATION AND CURETTAGE OF UTERUS    . LAPAROSCOPY N/A 03/24/2013   Procedure: LAPAROSCOPY OPERATIVE;  Surgeon: Betsy Coder, MD;  Location: [facility];  Service: Gynecology;  Laterality: N/A;  . Left Salpingectomy  03/24/2013   Ectopic Pregnancy  . UNILATERAL SALPINGECTOMY Left 03/24/2013   Procedure: UNILATERAL SALPINGECTOMY;  Surgeon: Betsy Coder, MD;  Location: [facility];  Service: Gynecology;  Laterality: Left;     reports that she has quit smoking. She has never used smokeless tobacco. She reports that she does not drink alcohol or use drugs.  Allergies  Allergen Reactions  . Penicillins Rash    Has patient had a PCN reaction causing immediate rash, facial/tongue/throat swelling, SOB or lightheadedness with hypotension: yes Has patient had a PCN reaction causing severe rash involving mucus membranes or skin necrosis: no Has patient had a PCN reaction that required hospitalization no Has patient had a PCN reaction occurring within the last 10 years: yes If all of the above answers are "NO", then  may proceed with Cephalosporin use.     Family History  Problem Relation Age of Onset  . Asthma Maternal Grandmother   . Hypertension Maternal Grandmother   . Diabetes Maternal Grandmother   . Hypertension Paternal Grandfather   . Diabetes Paternal Grandfather      Prior to Admission medications   Medication Sig Start Date End Date Taking? Authorizing Provider  ibuprofen (ADVIL) 600 MG tablet Take 1 tablet (600 mg total) by mouth every 6 (six) hours. 08/27/18   Barahona, Luvenia Starch, FNP  ondansetron (ZOFRAN ODT) 4 MG disintegrating tablet Take 1 tablet (4 mg total) by mouth every 8 (eight) hours as needed for nausea or vomiting. 11/07/18   Deno Etienne, DO  promethazine (PHENERGAN) 25 MG tablet Take 1 tablet (25 mg total) by mouth at bedtime as needed. 02/18/18   Leftwich-Kirby, Kathie Dike, CNM    Physical Exam:  Constitutional: Young female in NAD, calm, comfortable Vitals:   11/07/18 1745 11/07/18 1748 11/07/18 1930  BP: (!) 129/94 121/70   Pulse: 85 78 66  Resp:  18   Temp:  97.6 F (36.4 C)   TempSrc:  Oral   SpO2: 100% 100% 100%   Eyes: PERRL, lids and conjunctivae normal ENMT: Mucous membranes are moist. Posterior pharynx clear of any exudate or lesions.Normal dentition.  Neck: normal, supple, no masses, no thyromegaly Respiratory: clear to auscultation bilaterally, no wheezing, no crackles. Normal respiratory effort. No accessory muscle use.  Cardiovascular: Regular rate and rhythm, no murmurs / rubs / gallops. No extremity edema. 2+ pedal pulses. No carotid bruits.  Abdomen: Mild epigastric tenderness. Positive bowel sounds. Musculoskeletal: no clubbing / cyanosis. No joint deformity upper and lower extremities. Good ROM, no contractures. Normal muscle tone.  Skin: no rashes, lesions, ulcers. No induration Neurologic: CN 2-12 grossly intact. Sensation intact, DTR normal. Strength 5/5 in all 4.  Psychiatric: Normal judgment and insight. Alert and oriented x 3. Normal mood.     Labs on Admission: I have personally reviewed following labs and imaging studies  CBC: Recent Labs  Lab 11/07/18 1808  WBC 13.3*  NEUTROABS 12.5*  HGB 14.5  HCT 42.9  MCV 96.6  PLT 700   Basic Metabolic Panel: Recent Labs  Lab 11/07/18 1808  NA 132*  K 3.3*  CL  102  CO2 14*  GLUCOSE 119*  BUN 13  CREATININE 0.86  CALCIUM 9.5   GFR: CrCl cannot be calculated (Unknown ideal weight.). Liver Function Tests: Recent Labs  Lab 11/07/18 1808  AST 30  ALT 22  ALKPHOS 67  BILITOT 1.3*  PROT 8.2*  ALBUMIN 4.9   Recent Labs  Lab 11/07/18 1808  LIPASE 35   No results for input(s): AMMONIA in the last 168 hours. Coagulation Profile: No results for input(s): INR, PROTIME in the last 168 hours. Cardiac Enzymes: No results for input(s): CKTOTAL, CKMB, CKMBINDEX, TROPONINI in the last 168 hours. BNP (last 3 results) No results for input(s): PROBNP in the last 8760 hours. HbA1C: No results for input(s): HGBA1C in the last 72 hours. CBG: No results for input(s): GLUCAP in the last 168 hours. Lipid Profile: No results for input(s): CHOL, HDL, LDLCALC, TRIG, CHOLHDL, LDLDIRECT in the last 72 hours. Thyroid Function Tests: No results for input(s): TSH, T4TOTAL, FREET4, T3FREE, THYROIDAB in the last 72 hours. Anemia Panel: No results for input(s): VITAMINB12, FOLATE, FERRITIN, TIBC, IRON, RETICCTPCT in the last 72 hours. Urine analysis:    Component Value Date/Time   COLORURINE YELLOW 11/07/2018 1840  APPEARANCEUR CLEAR 11/07/2018 1840   LABSPEC 1.018 11/07/2018 1840   PHURINE 9.0 (H) 11/07/2018 1840   GLUCOSEU NEGATIVE 11/07/2018 1840   HGBUR SMALL (A) 11/07/2018 1840   BILIRUBINUR NEGATIVE 11/07/2018 1840   KETONESUR 80 (A) 11/07/2018 1840   PROTEINUR 30 (A) 11/07/2018 1840   UROBILINOGEN 0.2 09/21/2013 1930   NITRITE NEGATIVE 11/07/2018 1840   LEUKOCYTESUR NEGATIVE 11/07/2018 1840   Sepsis Labs: No results found for this or any previous visit (from the past 240 hour(s)).   Radiological Exams on Admission: No results found.    Assessment/Plan Intractable nausea and vomiting: Acute.  Patient presents with complaints of nausea, vomiting, abdominal pain.  No recent imaging available.  Question gastritis. -Admit to a MedSurg.  -Monitor intake and output -Advance diet as tolerated -Normal saline IV fluids with 20 mEq of potassium chloride at 100 mL/h -Antiemetics as needed -Check CT scan of the abdomen and pelvis without contrast -Protonix IV  Leukocytosis: Acute on chronic.  WBC elevated 13.3 on admission.  Suspect secondary to above. -Recheck WBC in a.m.  Hypokalemia: Acute. Potassium of 3.3 on admission. -To be replaced with IV fluids as seen above  Hyponatremia: Sodium 132 on admission.  Suspect hypovolemic hyponatremic secondary to nausea and vomiting. -Continue IV fluids as noted above   DVT prophylaxis: lovenox Code Status: Full Family Communication: No family present Disposition Plan: Possible discharge home in a.m. Consults called: None Admission status: observation  Norval Morton MD [facility] Pager [phone]   If 7PM-7AM, please contact night-coverage [email or web address] Password [facility]  11/08/2018, 12:38 AM

## 2018-11-08 NOTE — Plan of Care (Signed)
  Problem: Education: Goal: Knowledge of General Education information will improve Description Including pain rating scale, medication(s)/side effects and non-pharmacologic comfort measures Outcome: Progressing   

## 2018-11-08 NOTE — ED Notes (Signed)
Pt continues to c/o nausea.  Pt vomiting at this time

## 2018-11-08 NOTE — Progress Notes (Signed)
Received patient from ED, ambulatory with elevated BP at 140/111 and with intermittent nausea and vomiting, oriented to room, bed control, call light and plan of care, administered PRN medication Zofran 4mg  Inj and Hyrdralazine 10 mg Inj and noted helped with the nausea.  Provided ice water to drink.  Will monitor.

## 2018-11-08 NOTE — Progress Notes (Signed)
Patient seen and examined personally, I reviewed the chart, history and physical and admission note, done by admitting physician this morning and agree with the same with following addendum.  Please refer to the morning admission note for more detailed plan of care.  Briefly,  28 y.o. f w/ hx of recent child delivery(08/25/2018) presents to the ER for evaluation of nausea and vomiting.  Patient reports similar episode of nausea vomiting and diarrhea and her children.  Patient otherwise denies any abdominal pain fever chills diarrhea.  He was seen in the ER found to have leukocytosis hyponatremia hypokalemia, pregnancy test negative.  UA unremarkable except but ketones positive 80.  Given her persistent symptoms patient was admitted.  This morning on my exam patient is alert awake oriented, not in acute distress.  She reports no more nausea vomiting since early this morning today.  She has done well on clear liquid diet and diet advanced to full liquid diet.  Intractable nausea and vomiting:Likely gastroenteritis viral.Her children w/ similar symptoms . COVID19 is negative.Symptoms seems to be resolving with supportive care IV fluids Protonix.  Advancing diet-if she tolerates, will plan to discharge home today evening.CT abdomen/pelvis did not show any acute finding.  Hyponatremia/hypokalemia: Due to above.  Level much improved. Hyperchloremic metabolic acidosis this is secondary to her IV fluid hydration.  Her lactic acid was normal. Leukocytosis likely reactive #1 no fever, no evidence of UTI no respiratory symptoms.  Is downtrending. Plan on discharge home today if tolerates diet.

## 2018-11-08 NOTE — ED Provider Notes (Signed)
Discussed with Dr. Tamala Julian who will evaluate and admit.  UDS ordered.       Shalynn Jorstad, Gwenlyn Perking 11/08/18 Leandrew Koyanagi    Veryl Speak, MD 11/08/18 [phone]

## 2018-11-08 NOTE — ED Notes (Signed)
Report given to 6 N RN. All questions answered 

## 2018-11-09 ENCOUNTER — Other Ambulatory Visit: Payer: Self-pay

## 2018-11-09 ENCOUNTER — Encounter (HOSPITAL_COMMUNITY): Payer: Self-pay

## 2018-11-09 DIAGNOSIS — R112 Nausea with vomiting, unspecified: Secondary | ICD-10-CM | POA: Diagnosis not present

## 2018-11-09 DIAGNOSIS — E871 Hypo-osmolality and hyponatremia: Secondary | ICD-10-CM | POA: Diagnosis not present

## 2018-11-09 DIAGNOSIS — E876 Hypokalemia: Secondary | ICD-10-CM | POA: Diagnosis not present

## 2018-11-09 DIAGNOSIS — Z20828 Contact with and (suspected) exposure to other viral communicable diseases: Secondary | ICD-10-CM | POA: Diagnosis not present

## 2018-11-09 LAB — COMPREHENSIVE METABOLIC PANEL
ALT: 20 U/L (ref 0–44)
AST: 23 U/L (ref 15–41)
Albumin: 3.8 g/dL (ref 3.5–5.0)
Alkaline Phosphatase: 54 U/L (ref 38–126)
Anion gap: 14 (ref 5–15)
BUN: 11 mg/dL (ref 6–20)
CO2: 20 mmol/L — ABNORMAL LOW (ref 22–32)
Calcium: 8.9 mg/dL (ref 8.9–10.3)
Chloride: 106 mmol/L (ref 98–111)
Creatinine, Ser: 0.83 mg/dL (ref 0.44–1.00)
GFR calc Af Amer: >60 mL/min (ref >60)
GFR calc non Af Amer: >60 mL/min (ref >60)
Glucose, Bld: 93 mg/dL (ref 70–99)
Potassium: 3.7 mmol/L (ref 3.5–5.1)
Sodium: 140 mmol/L (ref 135–145)
Total Bilirubin: 1 mg/dL (ref 0.3–1.2)
Total Protein: 6.2 g/dL — ABNORMAL LOW (ref 6.5–8.1)

## 2018-11-09 LAB — CBC
HCT: 36.9 % (ref 36.0–46.0)
Hemoglobin: 12.6 g/dL (ref 12.0–15.0)
MCH: 32.6 pg (ref 26.0–34.0)
MCHC: 34.1 g/dL (ref 30.0–36.0)
MCV: 95.6 fL (ref 80.0–100.0)
Platelets: 155 10*3/uL (ref 150–400)
RBC: 3.86 MIL/uL — ABNORMAL LOW (ref 3.87–5.11)
RDW: 12 % (ref 11.5–15.5)
WBC: 8.5 10*3/uL (ref 4.0–10.5)
nRBC: 0 % (ref 0.0–0.2)

## 2018-11-09 LAB — TROPONIN I (HIGH SENSITIVITY): Troponin I (High Sensitivity): 12 ng/L (ref <18)

## 2018-11-09 MED ORDER — PROMETHAZINE HCL 12.5 MG PO TABS: 12.5000 mg | ORAL_TABLET | Freq: Four times a day (QID) | ORAL | 0 refills | Status: DC | PRN

## 2018-11-09 MED ORDER — PANTOPRAZOLE SODIUM 40 MG PO TBEC: 40.0000 mg | DELAYED_RELEASE_TABLET | Freq: Every day | ORAL | 0 refills | Status: DC

## 2018-11-09 MED ORDER — PROMETHAZINE HCL 25 MG/ML IJ SOLN
12.5000 mg | Freq: Four times a day (QID) | INTRAMUSCULAR | Status: DC | PRN
  Administered 2018-11-09: 12.5 mg via INTRAVENOUS
  Filled 2018-11-09: qty 1

## 2018-11-09 MED ORDER — ALUM & MAG HYDROXIDE-SIMETH 200-200-20 MG/5ML PO SUSP
30.0000 mL | Freq: Once | ORAL | Status: AC
  Administered 2018-11-09: 30 mL via ORAL
  Filled 2018-11-09: qty 30

## 2018-11-09 NOTE — Discharge Summary (Addendum)
Physician Discharge Summary  Debbie Hughes VZC:[id number] DOB: Dec 24, 1990 DOA: 11/07/2018  PCP: [name], PA-C  Admit date: 11/07/2018 Discharge date: 11/09/2018  Admitted From: home Disposition:  home  Recommendations for Outpatient Follow-up:  1. Follow up with PCP in 1-2 weeks 2. Please obtain BMP/CBC in one week 3. Please follow up on the following pending results:  Home Health:no  Equipment/Devices: none  Discharge Condition: Stable CODE STATUS: FULL Diet recommendation: Heart Healthy  Brief/Interim Summary: 28 y.o. f w/ hx of recent child delivery(08/25/2018) presents to the ER for evaluation of nausea and vomiting.  Patient reports similar episode of nausea vomiting and diarrhea and her children.  Patient otherwise denies any abdominal pain fever chills diarrhea.  He was seen in the ER found to have leukocytosis hyponatremia hypokalemia, pregnancy test negative.  UA unremarkable except but ketones positive 80.  Given her persistent symptoms patient was admitted.  morning on my exam patient is alert awake oriented, not in acute distress.  She reports no more nausea vomiting since early this morning today.  She has done well on clear liquid diet and diet advanced to full liquid diet. Issues addressed  Intractable nausea and vomiting:Likely gastroenteritis viral. ? From Marijuana- THC + in urine drug screen.Her children w/ similar symptoms- likely vira;- per husband family eating out these days. CT abdomen/pelvis did not show any acute finding.beta-hCG pregnancy test negative.  COVID19 is negative. patient had persistent nausea vomiting kept overnight.  Patient reported she does better with Phenergan, placed on Phenergan and able to tolerate diet this afternoon.  She would like to go home now. I will discharge home on Protonix, Phenergan.  If she has persistent or recurrent symptoms she may need GI evaluation/EGD.Avoid NSAIDs. She had UDS + for [facility]- marijuana can also cause  vomitng episodes- avoid illict substance- instruction provided.  Hyponatremia/hypokalemia: Due to above. Improved. Atypical chest pain/epigastric pain:Neg troponin, ekg no ischemic, could be related to vomiting-gastritis as she had epigastric pain.  She is on Protonix. See #1. Hyperchloremic metabolic acidosis this is secondary to her IV fluid hydration.  Her lactic acid was normal.  Bicarb level better. Leukocytosis likely reactive #1 no fever, no evidence of UTI no respiratory symptoms.  Leukocytosis resolved w ivf Ketosis from # 1- UA HAD KETONES 80- was hydrated w ivf. Encouraged po hydration, cont zofran/phenergan at home along with ppi.  Discharge Diagnoses:  Principal Problem:   Intractable nausea and vomiting Active Problems:   Hypokalemia   Leukocytosis   Hyponatremia    Discharge Instructions  Discharge Instructions    Diet general   Complete by: As directed    As tolerated   Discharge instructions   Complete by: As directed    Please call call MD or return to ER for similar or worsening recurring problem that brought you to hospital or if any fever,nausea/vomiting,abdominal pain, uncontrolled pain, chest pain,  shortness of breath or any other alarming symptoms.  Please follow-up your doctor as instructed in a week time and call the office for appointment.  Please avoid alcohol, smoking, or any other illicit substance and maintain healthy habits including taking your regular medications as prescribed.  You were cared for by a hospitalist during your hospital stay. If you have any questions about your discharge medications or the care you received while you were in the hospital after you are discharged, you can call the unit and ask to speak with the hospitalist on call if the hospitalist that took care of you  is not available.  Once you are discharged, your primary care physician will handle any further medical issues. Please note that NO REFILLS for any discharge  medications will be authorized once you are discharged, as it is imperative that you return to your primary care physician (or establish a relationship with a primary care physician if you do not have one) for your aftercare needs so that they can reassess your need for medications and monitor your lab values   Increase activity slowly   Complete by: As directed      Allergies as of 11/09/2018      Reactions   Penicillins Rash   Has patient had a PCN reaction causing immediate rash, facial/tongue/throat swelling, SOB or lightheadedness with hypotension: yes Has patient had a PCN reaction causing severe rash involving mucus membranes or skin necrosis: no Has patient had a PCN reaction that required hospitalization no Has patient had a PCN reaction occurring within the last 10 years: yes If all of the above answers are "NO", then may proceed with Cephalosporin use.      Medication List    TAKE these medications   ondansetron 4 MG disintegrating tablet Commonly known as: Zofran ODT Take 1 tablet (4 mg total) by mouth every 8 (eight) hours as needed for nausea or vomiting. What changed:   medication strength  how much to take   pantoprazole 40 MG tablet Commonly known as: Protonix Take 1 tablet (40 mg total) by mouth daily.   promethazine 12.5 MG tablet Commonly known as: PHENERGAN Take 1 tablet (12.5 mg total) by mouth every 6 (six) hours as needed for up to 30 doses for nausea or vomiting.      Follow-up Information    [name], PA-C On 11/09/2018.   Specialty: Family Medicine Why: Follow up with your Family doc. Contact information: [place] [address] [phone]          Allergies  Allergen Reactions  . Penicillins Rash    Has patient had a PCN reaction causing immediate rash, facial/tongue/throat swelling, SOB or lightheadedness with hypotension: yes Has patient had a PCN reaction causing severe rash involving mucus membranes or skin  necrosis: no Has patient had a PCN reaction that required hospitalization no Has patient had a PCN reaction occurring within the last 10 years: yes If all of the above answers are "NO", then may proceed with Cephalosporin use.     Consultations:  none  Procedures/Studies: Ct Abdomen Pelvis W Contrast  Result Date: 11/08/2018 CLINICAL DATA:  Abdominal pain EXAM: CT ABDOMEN AND PELVIS WITH CONTRAST TECHNIQUE: Multidetector CT imaging of the abdomen and pelvis was performed using the standard protocol following bolus administration of intravenous contrast. CONTRAST:  160mL OMNIPAQUE IOHEXOL 300 MG/ML  SOLN COMPARISON:  02/28/2016 FINDINGS: Lower chest: No acute abnormality. Hepatobiliary: No focal liver abnormality is seen. No gallstones, gallbladder wall thickening, or biliary dilatation. Pancreas: Unremarkable. No pancreatic ductal dilatation or surrounding inflammatory changes. Spleen: Normal in size without focal abnormality. Adrenals/Urinary Tract: Adrenal glands are normal. No hydronephrosis. Subcentimeter hypodensity in the cortex of mid right kidney too small to further characterize. Small stones in the mid left kidney measuring up to 5 mm. Bladder is slightly thick walled. Stomach/Bowel: The stomach is nonenlarged. No dilated small bowel. Negative appendix. No bowel wall thickening Vascular/Lymphatic: No significant vascular findings are present. No enlarged abdominal or pelvic lymph nodes. Reproductive: Uterus and bilateral adnexa are unremarkable. Other: No free air.  Small free fluid  in the pelvis. Musculoskeletal: No acute or significant osseous findings. IMPRESSION: 1. No CT evidence for acute intra-abdominal or pelvic abnormality. 2. Small stones in the left kidney. 3. Small free fluid in the pelvis Electronically Signed   By: Donavan Foil M.D.   On: 11/08/2018 02:26    Subjective: Tolerated diet well this afternoon.  Wanting to go home. Discharge Exam: Vitals:   11/09/18 0820  11/09/18 1503  BP: (!) 155/87 (!) 155/90  Pulse: 72 61  Resp: 18 18  Temp: 97.8 F (36.6 C) 98.8 F (37.1 C)  SpO2: 100% 98%   Vitals:   11/08/18 2032 11/09/18 0407 11/09/18 0820 11/09/18 1503  BP: 140/81 126/90 (!) 155/87 (!) 155/90  Pulse: 66 62 72 61  Resp: 16 15 18 18   Temp: 98.6 F (37 C) 99.7 F (37.6 C) 97.8 F (36.6 C) 98.8 F (37.1 C)  TempSrc: Oral Oral Oral Oral  SpO2: 100% 100% 100% 98%  Weight:        General: Pt is alert, awake, not in acute distress Cardiovascular: RRR, S1/S2 +, no rubs, no gallops Respiratory: CTA bilaterally, no wheezing, no rhonchi Abdominal: Soft, NT, ND, bowel sounds + Extremities: no edema, no cyanosis   The results of significant diagnostics from this hospitalization (including imaging, microbiology, ancillary and laboratory) are listed below for reference.     Microbiology: Recent Results (from the past 240 hour(s))  SARS Coronavirus 2 (CEPHEID - Performed in [place] hospital lab), Hosp Order     Status: None   Collection Time: 11/08/18  1:11 AM   Specimen: Nasopharyngeal Swab  Result Value Ref Range Status   SARS Coronavirus 2 NEGATIVE NEGATIVE Final    Comment: (NOTE) If result is NEGATIVE SARS-CoV-2 target nucleic acids are NOT DETECTED. The SARS-CoV-2 RNA is generally detectable in upper and lower  respiratory specimens during the acute phase of infection. The lowest  concentration of SARS-CoV-2 viral copies this assay can detect is 250  copies / mL. A negative result does not preclude SARS-CoV-2 infection  and should not be used as the sole basis for treatment or other  patient management decisions.  A negative result may occur with  improper specimen collection / handling, submission of specimen other  than nasopharyngeal swab, presence of viral mutation(s) within the  areas targeted by this assay, and inadequate number of viral copies  (<250 copies / mL). A negative result must be combined with clinical   observations, patient history, and epidemiological information. If result is POSITIVE SARS-CoV-2 target nucleic acids are DETECTED. The SARS-CoV-2 RNA is generally detectable in upper and lower  respiratory specimens dur ing the acute phase of infection.  Positive  results are indicative of active infection with SARS-CoV-2.  Clinical  correlation with patient history and other diagnostic information is  necessary to determine patient infection status.  Positive results do  not rule out bacterial infection or co-infection with other viruses. If result is PRESUMPTIVE POSTIVE SARS-CoV-2 nucleic acids MAY BE PRESENT.   A presumptive positive result was obtained on the submitted specimen  and confirmed on repeat testing.  While 2019 novel coronavirus  (SARS-CoV-2) nucleic acids may be present in the submitted sample  additional confirmatory testing may be necessary for epidemiological  and / or clinical management purposes  to differentiate between  SARS-CoV-2 and other Sarbecovirus currently known to infect humans.  If clinically indicated additional testing with an alternate test  methodology [phone]) is advised. The SARS-CoV-2 RNA is generally  detectable  in upper and lower respiratory sp ecimens during the acute  phase of infection. The expected result is Negative. Fact Sheet for Patients:  StrictlyIdeas.no Fact Sheet for Healthcare Providers: BankingDealers.co.za This test is not yet approved or cleared by the [place] FDA and has been authorized for detection and/or diagnosis of SARS-CoV-2 by FDA under an Emergency Use Authorization (EUA).  This EUA will remain in effect (meaning this test can be used) for the duration of the COVID-19 declaration under Section 564(b)(1) of the Act, 21 U.S.C. section 360bbb-3(b)(1), unless the authorization is terminated or revoked sooner. Performed at [facility], [place] [address]  [place], [place] [address]      Labs: BNP (last 3 results) No results for input(s): BNP in the last 8760 hours. Basic Metabolic Panel: Recent Labs  Lab 11/07/18 1808 11/08/18 0239 11/09/18 0756  NA 132* 133* 140  K 3.3* 3.7 3.7  CL 102 100 106  CO2 14* 18* 20*  GLUCOSE 119* 106* 93  BUN 13 7 11   CREATININE 0.86 0.85 0.83  CALCIUM 9.5 9.3 8.9   Liver Function Tests: Recent Labs  Lab 11/07/18 1808 11/09/18 0756  AST 30 23  ALT 22 20  ALKPHOS 67 54  BILITOT 1.3* 1.0  PROT 8.2* 6.2*  ALBUMIN 4.9 3.8   Recent Labs  Lab 11/07/18 1808  LIPASE 35   No results for input(s): AMMONIA in the last 168 hours. CBC: Recent Labs  Lab 11/07/18 1808 11/08/18 0239 11/09/18 0756  WBC 13.3* 12.1* 8.5  NEUTROABS 12.5*  --   --   HGB 14.5 14.3 12.6  HCT 42.9 40.4 36.9  MCV 96.6 92.7 95.6  PLT 223 204 155   Cardiac Enzymes: No results for input(s): CKTOTAL, CKMB, CKMBINDEX, TROPONINI in the last 168 hours. BNP: Invalid input(s): POCBNP CBG: No results for input(s): GLUCAP in the last 168 hours. D-Dimer No results for input(s): DDIMER in the last 72 hours. Hgb A1c No results for input(s): HGBA1C in the last 72 hours. Lipid Profile No results for input(s): CHOL, HDL, LDLCALC, TRIG, CHOLHDL, LDLDIRECT in the last 72 hours. Thyroid function studies No results for input(s): TSH, T4TOTAL, T3FREE, THYROIDAB in the last 72 hours.  Invalid input(s): FREET3 Anemia work up No results for input(s): VITAMINB12, FOLATE, FERRITIN, TIBC, IRON, RETICCTPCT in the last 72 hours. Urinalysis    Component Value Date/Time   COLORURINE YELLOW 11/07/2018 1840   APPEARANCEUR CLEAR 11/07/2018 1840   LABSPEC 1.018 11/07/2018 1840   PHURINE 9.0 (H) 11/07/2018 1840   GLUCOSEU NEGATIVE 11/07/2018 1840   HGBUR SMALL (A) 11/07/2018 1840   BILIRUBINUR NEGATIVE 11/07/2018 1840   KETONESUR 80 (A) 11/07/2018 1840   PROTEINUR 30 (A) 11/07/2018 1840   UROBILINOGEN 0.2 09/21/2013 1930   NITRITE  NEGATIVE 11/07/2018 1840   LEUKOCYTESUR NEGATIVE 11/07/2018 1840   Sepsis Labs Invalid input(s): PROCALCITONIN,  WBC,  LACTICIDVEN Microbiology Recent Results (from the past 240 hour(s))  SARS Coronavirus 2 (CEPHEID - Performed in [place] hospital lab), Hosp Order     Status: None   Collection Time: 11/08/18  1:11 AM   Specimen: Nasopharyngeal Swab  Result Value Ref Range Status   SARS Coronavirus 2 NEGATIVE NEGATIVE Final    Comment: (NOTE) If result is NEGATIVE SARS-CoV-2 target nucleic acids are NOT DETECTED. The SARS-CoV-2 RNA is generally detectable in upper and lower  respiratory specimens during the acute phase of infection. The lowest  concentration of SARS-CoV-2 viral copies this assay can detect is  250  copies / mL. A negative result does not preclude SARS-CoV-2 infection  and should not be used as the sole basis for treatment or other  patient management decisions.  A negative result may occur with  improper specimen collection / handling, submission of specimen other  than nasopharyngeal swab, presence of viral mutation(s) within the  areas targeted by this assay, and inadequate number of viral copies  (<250 copies / mL). A negative result must be combined with clinical  observations, patient history, and epidemiological information. If result is POSITIVE SARS-CoV-2 target nucleic acids are DETECTED. The SARS-CoV-2 RNA is generally detectable in upper and lower  respiratory specimens dur ing the acute phase of infection.  Positive  results are indicative of active infection with SARS-CoV-2.  Clinical  correlation with patient history and other diagnostic information is  necessary to determine patient infection status.  Positive results do  not rule out bacterial infection or co-infection with other viruses. If result is PRESUMPTIVE POSTIVE SARS-CoV-2 nucleic acids MAY BE PRESENT.   A presumptive positive result was obtained on the submitted specimen  and  confirmed on repeat testing.  While 2019 novel coronavirus  (SARS-CoV-2) nucleic acids may be present in the submitted sample  additional confirmatory testing may be necessary for epidemiological  and / or clinical management purposes  to differentiate between  SARS-CoV-2 and other Sarbecovirus currently known to infect humans.  If clinically indicated additional testing with an alternate test  methodology [phone]) is advised. The SARS-CoV-2 RNA is generally  detectable in upper and lower respiratory sp ecimens during the acute  phase of infection. The expected result is Negative. Fact Sheet for Patients:  StrictlyIdeas.no Fact Sheet for Healthcare Providers: BankingDealers.co.za This test is not yet approved or cleared by the [place] FDA and has been authorized for detection and/or diagnosis of SARS-CoV-2 by FDA under an Emergency Use Authorization (EUA).  This EUA will remain in effect (meaning this test can be used) for the duration of the COVID-19 declaration under Section 564(b)(1) of the Act, 21 U.S.C. section 360bbb-3(b)(1), unless the authorization is terminated or revoked sooner. Performed at [facility], [place] [address] [place], [place] [address]      Time coordinating discharge: 25 minutes  SIGNED:   Antonieta Pert, MD  [facility] 11/09/2018, 3:24 PM  If 7PM-7AM, please contact night-coverage [email or web address]

## 2018-11-09 NOTE — Progress Notes (Signed)
Debbie Hughes to be D/C'd  per MD order. Discussed with the patient and all questions fully answered.  VSS, Skin clean, dry and intact without evidence of skin break down, no evidence of skin tears noted.  IV catheter discontinued intact. Site without signs and symptoms of complications. Dressing and pressure applied.  An After Visit Summary was printed and given to the patient. Patient received prescription.  D/c education completed with patient/family including follow up instructions, medication list, d/c activities limitations if indicated, with other d/c instructions as indicated by MD - patient able to verbalize understanding, all questions fully answered.   Patient instructed to return to ED, call 911, or call MD for any changes in condition.   Patient to be escorted via [place], and D/C home via private auto.

## 2018-11-09 NOTE — Progress Notes (Signed)
Patient complains of chest pain. Dr. Maren Beach notified and orders received.

## 2018-11-25 ENCOUNTER — Other Ambulatory Visit: Payer: Self-pay

## 2018-11-25 ENCOUNTER — Encounter (HOSPITAL_COMMUNITY): Payer: Self-pay | Admitting: Emergency Medicine

## 2018-11-25 ENCOUNTER — Emergency Department (HOSPITAL_COMMUNITY)
Admission: EM | Admit: 2018-11-25 | Discharge: 2018-11-25 | Disposition: A | Discharge status: Home / Self Care | Payer: BC Managed Care – PPO | Attending: Emergency Medicine | Admitting: Emergency Medicine

## 2018-11-25 DIAGNOSIS — F1290 Cannabis use, unspecified, uncomplicated: Secondary | ICD-10-CM | POA: Insufficient documentation

## 2018-11-25 DIAGNOSIS — R112 Nausea with vomiting, unspecified: Secondary | ICD-10-CM | POA: Insufficient documentation

## 2018-11-25 DIAGNOSIS — R1084 Generalized abdominal pain: Secondary | ICD-10-CM | POA: Diagnosis not present

## 2018-11-25 LAB — CBC WITH DIFFERENTIAL/PLATELET
Abs Immature Granulocytes: 0.03 10*3/uL (ref 0.00–0.07)
Basophils Absolute: 0 10*3/uL (ref 0.0–0.1)
Basophils Relative: 0 %
Eosinophils Absolute: 0.1 10*3/uL (ref 0.0–0.5)
Eosinophils Relative: 1 %
HCT: 41.3 % (ref 36.0–46.0)
Hemoglobin: 13.7 g/dL (ref 12.0–15.0)
Immature Granulocytes: 0 %
Lymphocytes Relative: 17 %
Lymphs Abs: 1.7 10*3/uL (ref 0.7–4.0)
MCH: 32.3 pg (ref 26.0–34.0)
MCHC: 33.2 g/dL (ref 30.0–36.0)
MCV: 97.4 fL (ref 80.0–100.0)
Monocytes Absolute: 0.3 10*3/uL (ref 0.1–1.0)
Monocytes Relative: 3 %
Neutro Abs: 7.9 10*3/uL — ABNORMAL HIGH (ref 1.7–7.7)
Neutrophils Relative %: 79 %
Platelets: 281 10*3/uL (ref 150–400)
RBC: 4.24 MIL/uL (ref 3.87–5.11)
RDW: 11.9 % (ref 11.5–15.5)
WBC: 10.1 10*3/uL (ref 4.0–10.5)
nRBC: 0 % (ref 0.0–0.2)

## 2018-11-25 LAB — COMPREHENSIVE METABOLIC PANEL
ALT: 18 U/L (ref 0–44)
AST: 22 U/L (ref 15–41)
Albumin: 4.4 g/dL (ref 3.5–5.0)
Alkaline Phosphatase: 48 U/L (ref 38–126)
Anion gap: 13 (ref 5–15)
BUN: 13 mg/dL (ref 6–20)
CO2: 20 mmol/L — ABNORMAL LOW (ref 22–32)
Calcium: 9.8 mg/dL (ref 8.9–10.3)
Chloride: 109 mmol/L (ref 98–111)
Creatinine, Ser: 0.96 mg/dL (ref 0.44–1.00)
GFR calc Af Amer: >60 mL/min (ref >60)
GFR calc non Af Amer: >60 mL/min (ref >60)
Glucose, Bld: 131 mg/dL — ABNORMAL HIGH (ref 70–99)
Potassium: 3.4 mmol/L — ABNORMAL LOW (ref 3.5–5.1)
Sodium: 142 mmol/L (ref 135–145)
Total Bilirubin: 0.6 mg/dL (ref 0.3–1.2)
Total Protein: 7.2 g/dL (ref 6.5–8.1)

## 2018-11-25 LAB — URINALYSIS, ROUTINE W REFLEX MICROSCOPIC
Bilirubin Urine: NEGATIVE
Glucose, UA: NEGATIVE mg/dL
Hgb urine dipstick: NEGATIVE
Ketones, ur: 20 mg/dL — AB
Leukocytes,Ua: NEGATIVE
Nitrite: NEGATIVE
Protein, ur: NEGATIVE mg/dL
Specific Gravity, Urine: 1.018 (ref 1.005–1.030)
pH: 9 — ABNORMAL HIGH (ref 5.0–8.0)

## 2018-11-25 LAB — POC URINE PREG, ED: Preg Test, Ur: NEGATIVE

## 2018-11-25 LAB — RAPID URINE DRUG SCREEN, HOSP PERFORMED
Amphetamines: NOT DETECTED
Barbiturates: NOT DETECTED
Benzodiazepines: NOT DETECTED
Cocaine: NOT DETECTED
Opiates: NOT DETECTED
Tetrahydrocannabinol: POSITIVE — AB

## 2018-11-25 MED ORDER — CAPSAICIN 0.025 % EX CREA
TOPICAL_CREAM | Freq: Once | CUTANEOUS | Status: AC
  Administered 2018-11-25: 10:00:00 via TOPICAL
  Filled 2018-11-25: qty 60

## 2018-11-25 MED ORDER — DICYCLOMINE HCL 10 MG/ML IM SOLN
20.0000 mg | Freq: Once | INTRAMUSCULAR | Status: AC
  Administered 2018-11-25: 20 mg via INTRAMUSCULAR
  Filled 2018-11-25: qty 2

## 2018-11-25 MED ORDER — PROMETHAZINE HCL 25 MG RE SUPP: 25.0000 mg | Freq: Four times a day (QID) | RECTAL | 0 refills | Status: DC | PRN

## 2018-11-25 MED ORDER — PROMETHAZINE HCL 25 MG/ML IJ SOLN
25.0000 mg | Freq: Once | INTRAMUSCULAR | Status: AC
  Administered 2018-11-25: 09:00:00 25 mg via INTRAVENOUS
  Filled 2018-11-25: qty 1

## 2018-11-25 MED ORDER — SODIUM CHLORIDE 0.9 % IV BOLUS
1000.0000 mL | Freq: Once | INTRAVENOUS | Status: AC
  Administered 2018-11-25: 09:00:00 1000 mL via INTRAVENOUS

## 2018-11-25 MED ORDER — PROMETHAZINE HCL 25 MG/ML IJ SOLN: 25.0000 mg | Freq: Once | INTRAMUSCULAR | Status: DC | PRN

## 2018-11-25 MED ORDER — HALOPERIDOL LACTATE 5 MG/ML IJ SOLN
5.0000 mg | Freq: Once | INTRAMUSCULAR | Status: AC
  Administered 2018-11-25: 5 mg via INTRAVENOUS
  Filled 2018-11-25: qty 1

## 2018-11-25 NOTE — ED Notes (Signed)
Pt ambulated to restroom with steady gait.

## 2018-11-25 NOTE — ED Provider Notes (Signed)
[facility] EMERGENCY DEPARTMENT Provider Note   CSN: [id number] Arrival date & time: 11/25/18  0815    History   Chief Complaint Chief Complaint  Patient presents with  . Abdominal Pain  . Emesis    HPI Debbie Hughes is a 28 y.o. female.     The history is provided by the patient and medical records. No language interpreter was used.  Abdominal Pain Associated symptoms: nausea and vomiting   Associated symptoms: no constipation and no diarrhea   Emesis Associated symptoms: abdominal pain   Associated symptoms: no diarrhea    Debbie Hughes is a 28 y.o. female  with a PMH as listed below who presents to the Emergency Department complaining of nausea and vomiting which began at 4am this morning.  Patient reports at least 20 episodes of emesis. She also endorses generalized abdominal pain. She states the abdominal pain began after the retching, described as cramping. She feels it is 2/2 her vomiting, but unsure. She was recently hospitalized on 7/04 for intractable nausea and vomiting.  She did have reassuring CT scan at that time.  She did have THC on UDS.  She denies any drug use, specifically denies smoking weed when I evaluated her today. She tried her home phenergan with little improvement. She states that she threw up a few minutes after, so unsure if it had time to kick in. She states that about 2 days after hospital discharge, she got her appetite back and was feeling well. She took Protonix daily for about a week, but then stopped. She did try to take a dose this morning, but also threw it up. Had two bowel movements thus far, but denies them being loose, watery or bloody. No known sick contacts. No recent ABX use. No urinary symptoms. No fevers.   Past Medical History:  Diagnosis Date  . Breast mass in female 01/21/06  . Breast nodule 04/26/10   Left  . BV (bacterial vaginosis) 5.10/11  . Candida vaginitis 04/26/10  . Ectopic pregnancy 03/24/2013  .  Fibroadenoma   . H/O varicella   . History of chlamydia   . Irregular menstrual cycle 07/28/07  . 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 Vaginal discharge 10/01/07  . Vaginitis     Patient Active Problem List   Diagnosis Date Noted  . Intractable nausea and vomiting 11/08/2018  . Hypokalemia 11/08/2018  . Leukocytosis 11/08/2018  . Hyponatremia 11/08/2018  . Normal postpartum course 08/27/2018  . Indication for care in labor or delivery 08/26/2018  . Nausea and vomiting during pregnancy 08/16/2018  . Abdominal pain during pregnancy in third trimester 08/16/2018  . Emesis 08/16/2018  . Preterm contractions 08/16/2018  . Preterm labor 07/25/2017  . NSVD (normal spontaneous vaginal delivery) 07/25/2017  . Pregnant 12/17/2016  . Breast cyst, left (excised in 2007) 11/23/2016  . Hyperemesis gravidarum 11/22/2016  . Acute generalized abdominal pain 11/22/2016  . Allergy to penicillin 09/22/2013  . Hx of ectopic pregnancy---s/p left salpingectomy 03/2013 09/22/2013    Past Surgical History:  Procedure Laterality Date  . BREAST CYST EXCISION Left 2007  . DILATION AND CURETTAGE OF UTERUS    . LAPAROSCOPY N/A 03/24/2013   Procedure: LAPAROSCOPY OPERATIVE;  Surgeon: Betsy Coder, MD;  Location: [facility];  Service: Gynecology;  Laterality: N/A;  . Left Salpingectomy  03/24/2013   Ectopic Pregnancy  . UNILATERAL SALPINGECTOMY Left 03/24/2013   Procedure: UNILATERAL SALPINGECTOMY;  Surgeon: Betsy Coder,  MD;  Location: [facility];  Service: Gynecology;  Laterality: Left;     OB History    Gravida  5   Para  3   Term  2   Preterm  1   AB  2   Living  3     SAB  1   TAB      Ectopic  1   Multiple  0   Live Births  3            Home Medications    Prior to Admission medications   Medication Sig Start Date End Date Taking? Authorizing Provider  ondansetron (ZOFRAN ODT) 4 MG disintegrating tablet Take 1 tablet (4 mg total) by  mouth every 8 (eight) hours as needed for nausea or vomiting. 11/07/18   Deno Etienne, DO  pantoprazole (PROTONIX) 40 MG tablet Take 1 tablet (40 mg total) by mouth daily. 11/09/18 12/09/18  Antonieta Pert, MD  promethazine (PHENERGAN) 12.5 MG tablet Take 1 tablet (12.5 mg total) by mouth every 6 (six) hours as needed for up to 30 doses for nausea or vomiting. 11/09/18   Antonieta Pert, MD  promethazine (PHENERGAN) 25 MG suppository Place 1 suppository (25 mg total) rectally every 6 (six) hours as needed for nausea or vomiting. 11/25/18   , Ozella Almond, PA-C    Family History Family History  Problem Relation Age of Onset  . Asthma Maternal Grandmother   . Hypertension Maternal Grandmother   . Diabetes Maternal Grandmother   . Hypertension Paternal Grandfather   . Diabetes Paternal Grandfather     Social History Social History   Tobacco Use  . Smoking status: Former Research scientist (life sciences)  . Smokeless tobacco: Never Used  Substance Use Topics  . Alcohol use: No  . Drug use: No     Allergies   Penicillins   Review of Systems Review of Systems  Gastrointestinal: Positive for abdominal pain, nausea and vomiting. Negative for constipation and diarrhea.  All other systems reviewed and are negative.    Physical Exam Updated Vital Signs BP 127/71 (BP Location: Right Arm)   Pulse 85   Temp 97.9 F (36.6 C) (Oral)   Resp 12   SpO2 99%   Physical Exam Vitals signs and nursing note reviewed.  Constitutional:      General: She is not in acute distress.    Appearance: She is well-developed.  HENT:     Head: Normocephalic and atraumatic.  Neck:     Musculoskeletal: Neck supple.  Cardiovascular:     Rate and Rhythm: Normal rate and regular rhythm.     Heart sounds: Normal heart sounds. No murmur.  Pulmonary:     Effort: Pulmonary effort is normal. No respiratory distress.     Breath sounds: Normal breath sounds.  Abdominal:     General: There is no distension.     Palpations: Abdomen is soft.      Comments: No abdominal, flank and CVA tenderness.  Skin:    General: Skin is warm and dry.  Neurological:     Mental Status: She is alert and oriented to person, place, and time.      ED Treatments / Results  Labs (all labs ordered are listed, but only abnormal results are displayed) Labs Reviewed  CBC WITH DIFFERENTIAL/PLATELET - Abnormal; Notable for the following components:      Result Value   Neutro Abs 7.9 (*)    All other components within normal limits  COMPREHENSIVE METABOLIC PANEL - Abnormal;  Notable for the following components:   Potassium 3.4 (*)    CO2 20 (*)    Glucose, Bld 131 (*)    All other components within normal limits  URINALYSIS, ROUTINE W REFLEX MICROSCOPIC - Abnormal; Notable for the following components:   APPearance CLOUDY (*)    pH 9.0 (*)    Ketones, ur 20 (*)    All other components within normal limits  RAPID URINE DRUG SCREEN, HOSP PERFORMED - Abnormal; Notable for the following components:   Tetrahydrocannabinol POSITIVE (*)    All other components within normal limits  POC URINE PREG, ED    EKG None  Radiology No results found.  Procedures Procedures (including critical care time)  Medications Ordered in ED Medications  promethazine (PHENERGAN) injection 25 mg (has no administration in time range)  sodium chloride 0.9 % bolus 1,000 mL (0 mLs Intravenous Stopped 11/25/18 1120)  promethazine (PHENERGAN) injection 25 mg (25 mg Intravenous Given 11/25/18 0849)  dicyclomine (BENTYL) injection 20 mg (20 mg Intramuscular Given 11/25/18 0850)  capsaicin (ZOSTRIX) 0.025 % cream ( Topical Given 11/25/18 0935)  haloperidol lactate (HALDOL) injection 5 mg (5 mg Intravenous Given 11/25/18 1038)     Initial Impression / Assessment and Plan / ED Course  I have reviewed the triage vital signs and the nursing notes.  Pertinent labs & imaging results that were available during my care of the patient were reviewed by me and considered in my medical  decision making (see chart for details).       Debbie Hughes is a 28 y.o. female who presents to ED for nausea, vomiting and generalized abdominal pain which began earlier this morning.  History of similar with recent hospitalist admission.  Chart reviewed.  Did have negative CT scan on 7/05.  On exam today, she is afebrile, hemodynamically stable with no abdominal tenderness.  Frequent retching while I am in the room.  Labs reviewed and reassuring.  She denies smoking marijuana, but THC was noted on UDS.  Symptoms did improve with Phenergan, Haldol and capsaicin.  Tolerating PO. PCP versus GI follow-up encouraged.  Reasons to return to the emergency department discussed and all questions answered.  Final Clinical Impressions(s) / ED Diagnoses   Final diagnoses:  Non-intractable vomiting with nausea, unspecified vomiting type    ED Discharge Orders         Ordered    promethazine (PHENERGAN) 25 MG suppository  Every 6 hours PRN     11/25/18 1233           , Ozella Almond, PA-C 11/25/18 1242    Quintella Reichert, MD 11/27/18 [phone]

## 2018-11-25 NOTE — ED Triage Notes (Signed)
Pt arrives to ED from home with complaints of suprapubic abdominal pain and emesis starting at 0400 this morning. Pt states she cannot hold anything down.

## 2018-11-25 NOTE — Discharge Instructions (Signed)
It was my pleasure taking care of you today!   I have given you a prescription for Phenergan suppositories that you can use instead of your by mouth phenergan if you don't feel that you can keep medicine down.   Call the GI doctor today or tomorrow to schedule a follow up appointment.   Return to ER for new or worsening symptoms, any additional concerns.

## 2018-11-27 ENCOUNTER — Other Ambulatory Visit: Payer: Self-pay

## 2018-11-27 ENCOUNTER — Emergency Department (HOSPITAL_COMMUNITY)
Admission: EM | Admit: 2018-11-27 | Discharge: 2018-11-27 | Disposition: A | Discharge status: Home / Self Care | Payer: BC Managed Care – PPO | Attending: Emergency Medicine | Admitting: Emergency Medicine

## 2018-11-27 ENCOUNTER — Encounter (HOSPITAL_COMMUNITY): Payer: Self-pay | Admitting: Emergency Medicine

## 2018-11-27 DIAGNOSIS — R112 Nausea with vomiting, unspecified: Secondary | ICD-10-CM | POA: Insufficient documentation

## 2018-11-27 DIAGNOSIS — R1084 Generalized abdominal pain: Secondary | ICD-10-CM | POA: Diagnosis not present

## 2018-11-27 DIAGNOSIS — Z87891 Personal history of nicotine dependence: Secondary | ICD-10-CM | POA: Diagnosis not present

## 2018-11-27 LAB — COMPREHENSIVE METABOLIC PANEL
ALT: 29 U/L (ref 0–44)
AST: 56 U/L — ABNORMAL HIGH (ref 15–41)
Albumin: 4.5 g/dL (ref 3.5–5.0)
Alkaline Phosphatase: 48 U/L (ref 38–126)
Anion gap: 16 — ABNORMAL HIGH (ref 5–15)
BUN: 20 mg/dL (ref 6–20)
CO2: 18 mmol/L — ABNORMAL LOW (ref 22–32)
Calcium: 9.7 mg/dL (ref 8.9–10.3)
Chloride: 104 mmol/L (ref 98–111)
Creatinine, Ser: 0.98 mg/dL (ref 0.44–1.00)
GFR calc Af Amer: >60 mL/min (ref >60)
GFR calc non Af Amer: >60 mL/min (ref >60)
Glucose, Bld: 97 mg/dL (ref 70–99)
Potassium: 5 mmol/L (ref 3.5–5.1)
Sodium: 138 mmol/L (ref 135–145)
Total Bilirubin: 1.7 mg/dL — ABNORMAL HIGH (ref 0.3–1.2)
Total Protein: 7 g/dL (ref 6.5–8.1)

## 2018-11-27 LAB — WET PREP, GENITAL
Clue Cells Wet Prep HPF POC: NONE SEEN
Sperm: NONE SEEN
Trich, Wet Prep: NONE SEEN
Yeast Wet Prep HPF POC: NONE SEEN

## 2018-11-27 LAB — CBC WITH DIFFERENTIAL/PLATELET
Abs Immature Granulocytes: 0.02 10*3/uL (ref 0.00–0.07)
Basophils Absolute: 0 10*3/uL (ref 0.0–0.1)
Basophils Relative: 0 %
Eosinophils Absolute: 0 10*3/uL (ref 0.0–0.5)
Eosinophils Relative: 0 %
HCT: 43.8 % (ref 36.0–46.0)
Hemoglobin: 14.6 g/dL (ref 12.0–15.0)
Immature Granulocytes: 0 %
Lymphocytes Relative: 19 %
Lymphs Abs: 1.6 10*3/uL (ref 0.7–4.0)
MCH: 32.3 pg (ref 26.0–34.0)
MCHC: 33.3 g/dL (ref 30.0–36.0)
MCV: 96.9 fL (ref 80.0–100.0)
Monocytes Absolute: 0.4 10*3/uL (ref 0.1–1.0)
Monocytes Relative: 4 %
Neutro Abs: 6.4 10*3/uL (ref 1.7–7.7)
Neutrophils Relative %: 77 %
Platelets: 282 10*3/uL (ref 150–400)
RBC: 4.52 MIL/uL (ref 3.87–5.11)
RDW: 12 % (ref 11.5–15.5)
WBC: 8.4 10*3/uL (ref 4.0–10.5)
nRBC: 0 % (ref 0.0–0.2)

## 2018-11-27 LAB — URINALYSIS, ROUTINE W REFLEX MICROSCOPIC
Bilirubin Urine: NEGATIVE
Glucose, UA: NEGATIVE mg/dL
Hgb urine dipstick: NEGATIVE
Ketones, ur: 40 mg/dL — AB
Leukocytes,Ua: NEGATIVE
Nitrite: NEGATIVE
Protein, ur: NEGATIVE mg/dL
Specific Gravity, Urine: 1.025 (ref 1.005–1.030)
pH: 6.5 (ref 5.0–8.0)

## 2018-11-27 LAB — I-STAT BETA HCG BLOOD, ED (MC, WL, AP ONLY): I-stat hCG, quantitative: 6.6 m[IU]/mL — ABNORMAL HIGH (ref <5)

## 2018-11-27 LAB — RAPID URINE DRUG SCREEN, HOSP PERFORMED
Amphetamines: NOT DETECTED
Barbiturates: NOT DETECTED
Benzodiazepines: NOT DETECTED
Cocaine: NOT DETECTED
Opiates: NOT DETECTED
Tetrahydrocannabinol: POSITIVE — AB

## 2018-11-27 LAB — LIPASE, BLOOD: Lipase: 33 U/L (ref 11–51)

## 2018-11-27 LAB — PREGNANCY, URINE: Preg Test, Ur: NEGATIVE

## 2018-11-27 MED ORDER — SODIUM CHLORIDE 0.9 % IV BOLUS
1000.0000 mL | Freq: Once | INTRAVENOUS | Status: AC
  Administered 2018-11-27: 10:00:00 1000 mL via INTRAVENOUS

## 2018-11-27 MED ORDER — PROMETHAZINE HCL 25 MG/ML IJ SOLN
25.0000 mg | Freq: Once | INTRAMUSCULAR | Status: AC
  Administered 2018-11-27: 10:00:00 25 mg via INTRAVENOUS
  Filled 2018-11-27: qty 1

## 2018-11-27 MED ORDER — DIPHENHYDRAMINE HCL 50 MG/ML IJ SOLN
25.0000 mg | Freq: Once | INTRAMUSCULAR | Status: AC
  Administered 2018-11-27: 09:00:00 25 mg via INTRAVENOUS
  Filled 2018-11-27: qty 1

## 2018-11-27 MED ORDER — ONDANSETRON HCL 4 MG/2ML IJ SOLN
4.0000 mg | Freq: Once | INTRAMUSCULAR | Status: AC
  Administered 2018-11-27: 08:00:00 4 mg via INTRAVENOUS
  Filled 2018-11-27: qty 2

## 2018-11-27 MED ORDER — SODIUM CHLORIDE 0.9 % IV BOLUS
1000.0000 mL | Freq: Once | INTRAVENOUS | Status: AC
  Administered 2018-11-27: 1000 mL via INTRAVENOUS

## 2018-11-27 MED ORDER — PROMETHAZINE HCL 25 MG/ML IJ SOLN: 12.5000 mg | Freq: Once | INTRAMUSCULAR | Status: DC

## 2018-11-27 MED ORDER — METOCLOPRAMIDE HCL 5 MG/ML IJ SOLN
10.0000 mg | Freq: Once | INTRAMUSCULAR | Status: AC
  Administered 2018-11-27: 09:00:00 10 mg via INTRAVENOUS
  Filled 2018-11-27: qty 2

## 2018-11-27 NOTE — ED Notes (Signed)
Patient verbalizes understanding of discharge instructions. Opportunity for questioning and answers were provided. Armband removed by staff, pt discharged from ED.  

## 2018-11-27 NOTE — ED Triage Notes (Signed)
Pt from home, was woke up w/ emesis around 4am.  Tried home meds but no relief.

## 2018-11-27 NOTE — Discharge Instructions (Addendum)
You have been seen today for nausea, vomiting, and abdominal pain. Please read and follow all provided instructions.   1. Medications: continue antiemetics as previously prescribed, usual home medications 2. Treatment: rest, drink plenty of fluids, advance diet slowly as tolerated, avoid marijuana use 3. Follow Up: Please follow up with your primary doctor in 2 days for discussion of your diagnoses and further evaluation after today's visit; if you do not have a primary care doctor use the resource guide provided to find one; Please return to the ER for any new or worsening symptoms. Please obtain all of your results from medical records or have your doctors office obtain the results - share them with your doctor - you should be seen at your doctors office. Call today to arrange your follow up.   Take medications as prescribed. Please review all of the medicines and only take them if you do not have an allergy to them. Return to the emergency room for worsening condition or new concerning symptoms. Follow up with your regular doctor. If you don't have a regular doctor use one of the numbers below to establish a primary care doctor.  Please be aware that if you are taking birth control pills, taking other prescriptions, ESPECIALLY ANTIBIOTICS may make the birth control ineffective - if this is the case, either do not engage in sexual activity or use alternative methods of birth control such as condoms until you have finished the medicine and your family doctor says it is OK to restart them. If you are on a blood thinner such as COUMADIN, be aware that any other medicine that you take may cause the coumadin to either work too much, or not enough - you should have your coumadin level rechecked in next 7 days if this is the case.  ?  It is also a possibility that you have an allergic reaction to any of the medicines that you have been prescribed - Everybody reacts differently to medications and while MOST  people have no trouble with most medicines, you may have a reaction such as nausea, vomiting, rash, swelling, shortness of breath. If this is the case, please stop taking the medicine immediately and contact your physician.  ?  You should return to the ER if you develop severe or worsening symptoms.   Emergency Department Resource Guide 1) Find a Doctor and Pay Out of Pocket Although you won't have to find out who is covered by your insurance plan, it is a good idea to ask around and get recommendations. You will then need to call the office and see if the doctor you have chosen will accept you as a new patient and what types of options they offer for patients who are self-pay. Some doctors offer discounts or will set up payment plans for their patients who do not have insurance, but you will need to ask so you aren't surprised when you get to your appointment.  2) Contact Your Local Health Department Not all health departments have doctors that can see patients for sick visits, but many do, so it is worth a call to see if yours does. If you don't know where your local health department is, you can check in your phone book. The CDC also has a tool to help you locate your state's health department, and many state websites also have listings of all of their local health departments.  3) Find a [facility] If your illness is not likely to be very severe or  complicated, you may want to try a walk in clinic. These are popping up all over the country in pharmacies, drugstores, and shopping centers. They're usually staffed by nurse practitioners or physician assistants that have been trained to treat common illnesses and complaints. They're usually fairly quick and inexpensive. However, if you have serious medical issues or chronic medical problems, these are probably not your best option.  No Primary Care Doctor: Call [facility] Connect at  [phone] - they can help you locate a primary care doctor that   accepts your insurance, provides certain services, etc. Physician Referral Service- [phone]  Chronic Pain Problems: Organization         Address  Phone   Notes  [facility]  [phone] Patients need to be referred by their primary care doctor.   Medication Assistance: Organization         Address  Phone   Notes  [facility] Medication [facility] [address] [place], [place] [address] [phone] --Must be a resident of [facility] -- Must have NO insurance coverage whatsoever (no Medicaid/ Medicare, etc.) -- The pt. MUST have a primary care doctor that directs their care regularly and follows them in the community   [facility]  [phone]   [employer]  [phone]    Agencies that provide inexpensive medical care: Organization         Address  Phone   Notes  [place]  [phone]   [facility]    [phone]   [facility] [place], [place] [address] [phone]   [place] [address] [place] [phone]   [facility]    [phone]   [facility]    [phone]   [place] and [address], [place] Phone:  [phone], Fax:  [phone] Hours of Operation:  9 am - 6 pm, M-F.  Also accepts Medicaid/Medicare and self-pay.  [facility] for [place], [address], [place] Phone: [phone], Fax: [phone]. Hours of Operation:  8:30 am - 5:30 pm, M-F.  Also accepts Medicaid and self-pay.  [facility] [address], [place] Phone: [phone]   [place], [place], [place] [phone], Ext. [phone] Mondays & Thursdays: 7-9 AM.  First 15 patients are seen on a first come, first serve basis.    [place] Providers:  Organization          Address  Phone   Notes  [facility] [address], [place] [phone] Also accepts self-pay patients.  [facility] [address], [place]  [phone]   [place], [address], [place] [phone]   [facility] [address], [place] [phone]   Lucianne Lei [address], [place]   [phone] Only accepts [place] patients after they have their name applied to their card.   Self-Pay (no insurance) in [facility]:  Organization         Address  Phone   Notes  Sickle Cell Patients, [facility] [place] [phone]   [facility] [place] (  [facility] [place]  [place], [address], [place] [phone]   [facility]/Dr. Osei-Bonsu  [address] [place] or [address], [place] [phone] Phone number for both [place] and [place] locations is the same.  [facility] [address], [place] [phone]   [facility] [address] [place] or [address] [phone]   [facility] [address], [place] [phone], phone; [phone], fax Sees patients 1st and 3rd Saturday of every month.  Must not qualify for public or private insurance (i.e. Medicaid, Medicare, [place] Health Choice, Veterans' Benefits)  Household income should be no more than 200% of the poverty level The clinic cannot treat you if you are pregnant or think you are pregnant  Sexually transmitted diseases are not treated at the clinic.

## 2018-11-27 NOTE — ED Provider Notes (Addendum)
[facility] EMERGENCY DEPARTMENT Provider Note   CSN: [id number] Arrival date & time: 11/27/18  1856    History   Chief Complaint Chief Complaint  Patient presents with  . Emesis  . Abdominal Pain    HPI Debbie Hughes is a 28 y.o. female with a PMH of ectopic pregnancy, BV, LGSIL, and Hyperemesis gravidarum presents with intermittent generalized abdominal pain onset at 4am today. Patient describes abdominal pain as a mild ache. Patient states symptoms are worse with vomiting. Patient reports nausea and multiple episodes of non bloody non bilious vomiting. Per chart review, patient was evaluated for similar symptoms on 07/04 and admitted for nausea and vomiting. Patient was also seen on 07/22 and discharged with phenergan. Patient reports she has taken phenergan without relief. Patient states she gave birth on 08/25/2018 and on 07/27/2017, therefore, she is unsure of her LMP. Patient denies vaginal discharge, vaginal bleeding, genital sores, dysuria, hematuria, or urinary frequency. Patient reports she she has had a left salpingectomy in 2014. Patient denies any other abdominal surgeries. Per chart review, patient has has THC on UDS in the past, but patient denies alcohol, tobacco, or drug use.  Patient denies fever, cough, congestion, diarrhea, constipation, chest pain, shortness of breath, sick exposures, or recent travel. Patient reports she last ate yesterday.      HPI  Past Medical History:  Diagnosis Date  . Breast mass in female 01/21/06  . Breast nodule 04/26/10   Left  . BV (bacterial vaginosis) 5.10/11  . Candida vaginitis 04/26/10  . Ectopic pregnancy 03/24/2013  . Fibroadenoma   . H/O varicella   . History of chlamydia   . Irregular menstrual cycle 07/28/07  . 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 Vaginal discharge 10/01/07  . Vaginitis     Patient Active Problem List   Diagnosis Date Noted  . Intractable nausea  and vomiting 11/08/2018  . Hypokalemia 11/08/2018  . Leukocytosis 11/08/2018  . Hyponatremia 11/08/2018  . Normal postpartum course 08/27/2018  . Indication for care in labor or delivery 08/26/2018  . Nausea and vomiting during pregnancy 08/16/2018  . Abdominal pain during pregnancy in third trimester 08/16/2018  . Emesis 08/16/2018  . Preterm contractions 08/16/2018  . Preterm labor 07/25/2017  . NSVD (normal spontaneous vaginal delivery) 07/25/2017  . Pregnant 12/17/2016  . Breast cyst, left (excised in 2007) 11/23/2016  . Hyperemesis gravidarum 11/22/2016  . Acute generalized abdominal pain 11/22/2016  . Allergy to penicillin 09/22/2013  . Hx of ectopic pregnancy---s/p left salpingectomy 03/2013 09/22/2013    Past Surgical History:  Procedure Laterality Date  . BREAST CYST EXCISION Left 2007  . DILATION AND CURETTAGE OF UTERUS    . LAPAROSCOPY N/A 03/24/2013   Procedure: LAPAROSCOPY OPERATIVE;  Surgeon: Betsy Coder, MD;  Location: [facility];  Service: Gynecology;  Laterality: N/A;  . Left Salpingectomy  03/24/2013   Ectopic Pregnancy  . UNILATERAL SALPINGECTOMY Left 03/24/2013   Procedure: UNILATERAL SALPINGECTOMY;  Surgeon: Betsy Coder, MD;  Location: [facility];  Service: Gynecology;  Laterality: Left;     OB History    Gravida  5   Para  3   Term  2   Preterm  1   AB  2   Living  3     SAB  1   TAB      Ectopic  1   Multiple  0   Live Births  3  Home Medications    Prior to Admission medications   Medication Sig Start Date End Date Taking? Authorizing Provider  ondansetron (ZOFRAN ODT) 4 MG disintegrating tablet Take 1 tablet (4 mg total) by mouth every 8 (eight) hours as needed for nausea or vomiting. 11/07/18   Deno Etienne, DO  pantoprazole (PROTONIX) 40 MG tablet Take 1 tablet (40 mg total) by mouth daily. 11/09/18 12/09/18  Antonieta Pert, MD  promethazine (PHENERGAN) 12.5 MG tablet Take 1 tablet (12.5 mg total) by mouth every 6 (six)  hours as needed for up to 30 doses for nausea or vomiting. 11/09/18   Antonieta Pert, MD  promethazine (PHENERGAN) 25 MG suppository Place 1 suppository (25 mg total) rectally every 6 (six) hours as needed for nausea or vomiting. 11/25/18   Ward, Ozella Almond, PA-C    Family History Family History  Problem Relation Age of Onset  . Asthma Maternal Grandmother   . Hypertension Maternal Grandmother   . Diabetes Maternal Grandmother   . Hypertension Paternal Grandfather   . Diabetes Paternal Grandfather     Social History Social History   Tobacco Use  . Smoking status: Former Research scientist (life sciences)  . Smokeless tobacco: Never Used  Substance Use Topics  . Alcohol use: No  . Drug use: No     Allergies   Penicillins   Review of Systems Review of Systems  Constitutional: Negative for activity change, appetite change, chills, fever and unexpected weight change.  HENT: Negative for congestion, rhinorrhea and sore throat.   Eyes: Negative for visual disturbance.  Respiratory: Negative for cough and shortness of breath.   Cardiovascular: Negative for chest pain.  Gastrointestinal: Positive for abdominal pain, nausea and vomiting. Negative for constipation and diarrhea.  Endocrine: Negative for polydipsia, polyphagia and polyuria.  Genitourinary: Negative for dysuria, flank pain, frequency, vaginal bleeding, vaginal discharge and vaginal pain.  Musculoskeletal: Negative for back pain.  Skin: Negative for rash.  Allergic/Immunologic: Negative for immunocompromised state.  Psychiatric/Behavioral: The patient is not nervous/anxious.      Physical Exam Updated Vital Signs BP (!) 146/104 (BP Location: Left Arm)   Pulse 84   Temp 98.4 F (36.9 C) (Oral)   Resp 16   Ht 5\' 3"  (1.6 m)   Wt 52.2 kg   SpO2 100%   BMI 20.37 kg/m   Physical Exam Vitals signs and nursing note reviewed. Exam conducted with a chaperone present.  Constitutional:      General: She is not in acute distress.    Appearance:  She is well-developed. She is not diaphoretic.  HENT:     Head: Normocephalic and atraumatic.     Mouth/Throat:     Mouth: Mucous membranes are dry.     Pharynx: Oropharynx is clear. Uvula midline. No posterior oropharyngeal erythema.  Neck:     Musculoskeletal: Normal range of motion and neck supple.  Cardiovascular:     Rate and Rhythm: Normal rate and regular rhythm.     Heart sounds: Normal heart sounds. No murmur. No friction rub. No gallop.   Pulmonary:     Effort: Pulmonary effort is normal. No respiratory distress.     Breath sounds: Normal breath sounds. No wheezing or rales.  Abdominal:     General: Abdomen is flat. Bowel sounds are normal. There is no distension.     Palpations: Abdomen is soft. Abdomen is not rigid. There is no mass.     Tenderness: There is no abdominal tenderness. There is no right CVA tenderness, left CVA  tenderness, guarding or rebound.     Hernia: No hernia is present.  Genitourinary:    Vagina: Normal. No vaginal discharge, tenderness or bleeding.     Cervix: No cervical motion tenderness or discharge.     Uterus: Normal.      Adnexa: Right adnexa normal and left adnexa normal.  Musculoskeletal: Normal range of motion.  Skin:    Findings: No rash.  Neurological:     Mental Status: She is alert and oriented to person, place, and time.     ED Treatments / Results  Labs (all labs ordered are listed, but only abnormal results are displayed) Labs Reviewed  WET PREP, GENITAL - Abnormal; Notable for the following components:      Result Value   WBC, Wet Prep HPF POC FEW (*)    All other components within normal limits  URINALYSIS, ROUTINE W REFLEX MICROSCOPIC - Abnormal; Notable for the following components:   APPearance HAZY (*)    Ketones, ur 40 (*)    All other components within normal limits  RAPID URINE DRUG SCREEN, HOSP PERFORMED - Abnormal; Notable for the following components:   Tetrahydrocannabinol POSITIVE (*)    All other components  within normal limits  COMPREHENSIVE METABOLIC PANEL - Abnormal; Notable for the following components:   CO2 18 (*)    AST 56 (*)    Total Bilirubin 1.7 (*)    Anion gap 16 (*)    All other components within normal limits  I-STAT BETA HCG BLOOD, ED ([facility], [facility], [facility] ONLY) - Abnormal; Notable for the following components:   I-stat hCG, quantitative 6.6 (*)    All other components within normal limits  CBC WITH DIFFERENTIAL/PLATELET  PREGNANCY, URINE  LIPASE, BLOOD  GC/CHLAMYDIA PROBE AMP ([place]) NOT AT [facility]    EKG None  Radiology No results found.  Procedures Procedures (including critical care time)  Medications Ordered in ED Medications  sodium chloride 0.9 % bolus 1,000 mL (0 mLs Intravenous Stopped 11/27/18 0930)  ondansetron (ZOFRAN) injection 4 mg (4 mg Intravenous Given 11/27/18 0804)  metoCLOPramide (REGLAN) injection 10 mg (10 mg Intravenous Given 11/27/18 0909)  diphenhydrAMINE (BENADRYL) injection 25 mg (25 mg Intravenous Given 11/27/18 0906)  sodium chloride 0.9 % bolus 1,000 mL (1,000 mLs Intravenous New Bag/Given 11/27/18 1028)  promethazine (PHENERGAN) injection 25 mg (25 mg Intravenous Given 11/27/18 1028)     Initial Impression / Assessment and Plan / ED Course  I have reviewed the triage vital signs and the nursing notes.  Pertinent labs & imaging results that were available during my care of the patient were reviewed by me and considered in my medical decision making (see chart for details).  Clinical Course as of Nov 27 1226  Fri Nov 27, 2018  0759 CBC is unremarkable.  CBC with Differential [AH]  0853 Few WBCs noted on wet prep.  WBC, Wet Prep HPF POC(!): FEW [AH]  0924 I stat beta hCG slightly elevated at 6.6. Discussed findings with patient. Advised patient to follow up with PCP/OBGYN to have levels rechecked.  I-Stat beta hCG blood, ED(!) [AH]  [id number] Urine pregnancy test is negative.  Pregnancy, urine [AH]  0948 THC is positive in UDS.   Tetrahydrocannabinol(!): POSITIVE [AH]  1054 Reassessed patient. Patient reports symptoms have significantly improved. Patient states she was able to sleep comfortably.   [AH]  1153 Reassessed patient. Patient states symptoms continue to improve and she was able to tolerate PO intake.   [AH]  Clinical Course User Index [AH] Arville Lime, [place]      Patient presents with nausea, vomiting, and generalized abdominal pain. Provided IVF and antiemetics in the ER. Patient has been evaluated on 07/22 and 07/05 with similar symptoms. Patient had a negative CT abdomen on 07/05. Patient denies marijuana use, however, UDS is positive for THC. Patient is nontoxic, nonseptic appearing, in no apparent distress.  Patient's pain and other symptoms adequately managed in emergency department.  Labs, imaging and vitals reviewed.  Patient does not meet the SIRS or Sepsis criteria.  On repeat exam patient does not have a surgical abdomin and there are no peritoneal signs.  No indication of appendicitis, bowel obstruction, bowel perforation, cholecystitis, diverticulitis, PID or ectopic pregnancy.  Patient is able to tolerate PO intake. Discussed avoiding marijuana use since it could be contributing to symptoms. Discussed elevated BP during today's visit. Patient denies any symptoms of hypertensive crisis. Advised patient to follow up with PCP. Patient discharged home with symptomatic treatment and given strict instructions for follow-up with their primary care physician and/or GI.  I have also discussed reasons to return immediately to the ER.  Patient expresses understanding and agrees with plan.  Final Clinical Impressions(s) / ED Diagnoses   Final diagnoses:  Generalized abdominal pain  Nausea and vomiting, intractability of vomiting not specified, unspecified vomiting type    ED Discharge Orders    None       Arville Lime, PA-C 11/27/18 [address] [place], [place] 11/27/18 1229     Dorie Rank, MD 11/28/18 [phone]

## 2018-11-28 LAB — GC/CHLAMYDIA PROBE AMP (~~LOC~~) NOT AT ARMC
Chlamydia: NEGATIVE
Neisseria Gonorrhea: NEGATIVE

## 2018-12-01 ENCOUNTER — Other Ambulatory Visit: Payer: Self-pay | Admitting: Gastroenterology

## 2018-12-01 DIAGNOSIS — R112 Nausea with vomiting, unspecified: Secondary | ICD-10-CM

## 2018-12-14 ENCOUNTER — Emergency Department (HOSPITAL_COMMUNITY)
Admission: EM | Admit: 2018-12-14 | Discharge: 2018-12-15 | Disposition: A | Discharge status: Home / Self Care | Payer: BC Managed Care – PPO | Attending: Emergency Medicine | Admitting: Emergency Medicine

## 2018-12-14 ENCOUNTER — Encounter (HOSPITAL_COMMUNITY): Payer: Self-pay

## 2018-12-14 ENCOUNTER — Other Ambulatory Visit: Payer: Self-pay

## 2018-12-14 DIAGNOSIS — Z87891 Personal history of nicotine dependence: Secondary | ICD-10-CM | POA: Insufficient documentation

## 2018-12-14 DIAGNOSIS — R112 Nausea with vomiting, unspecified: Secondary | ICD-10-CM | POA: Diagnosis present

## 2018-12-14 LAB — CBC
HCT: 40.5 % (ref 36.0–46.0)
Hemoglobin: 13.7 g/dL (ref 12.0–15.0)
MCH: 32.3 pg (ref 26.0–34.0)
MCHC: 33.8 g/dL (ref 30.0–36.0)
MCV: 95.5 fL (ref 80.0–100.0)
Platelets: 250 10*3/uL (ref 150–400)
RBC: 4.24 MIL/uL (ref 3.87–5.11)
RDW: 11.8 % (ref 11.5–15.5)
WBC: 11.9 10*3/uL — ABNORMAL HIGH (ref 4.0–10.5)
nRBC: 0 % (ref 0.0–0.2)

## 2018-12-14 LAB — COMPREHENSIVE METABOLIC PANEL
ALT: 20 U/L (ref 0–44)
AST: 28 U/L (ref 15–41)
Albumin: 4.4 g/dL (ref 3.5–5.0)
Alkaline Phosphatase: 54 U/L (ref 38–126)
Anion gap: 17 — ABNORMAL HIGH (ref 5–15)
BUN: 14 mg/dL (ref 6–20)
CO2: 16 mmol/L — ABNORMAL LOW (ref 22–32)
Calcium: 9.6 mg/dL (ref 8.9–10.3)
Chloride: 105 mmol/L (ref 98–111)
Creatinine, Ser: 0.9 mg/dL (ref 0.44–1.00)
GFR calc Af Amer: >60 mL/min (ref >60)
GFR calc non Af Amer: >60 mL/min (ref >60)
Glucose, Bld: 141 mg/dL — ABNORMAL HIGH (ref 70–99)
Potassium: 3.4 mmol/L — ABNORMAL LOW (ref 3.5–5.1)
Sodium: 138 mmol/L (ref 135–145)
Total Bilirubin: 0.6 mg/dL (ref 0.3–1.2)
Total Protein: 7.3 g/dL (ref 6.5–8.1)

## 2018-12-14 LAB — I-STAT BETA HCG BLOOD, ED (MC, WL, AP ONLY): I-stat hCG, quantitative: <5 m[IU]/mL (ref <5)

## 2018-12-14 LAB — LIPASE, BLOOD: Lipase: 38 U/L (ref 11–51)

## 2018-12-14 MED ORDER — SODIUM CHLORIDE 0.9% FLUSH: 3.0000 mL | Freq: Once | INTRAVENOUS | Status: DC

## 2018-12-14 MED ORDER — ONDANSETRON 4 MG PO TBDP
4.0000 mg | ORAL_TABLET | Freq: Once | ORAL | Status: AC
  Administered 2018-12-14: 20:00:00 4 mg via ORAL
  Filled 2018-12-14: qty 1

## 2018-12-14 MED ORDER — ONDANSETRON 4 MG PO TBDP
4.0000 mg | ORAL_TABLET | Freq: Once | ORAL | Status: AC | PRN
  Administered 2018-12-14: 19:00:00 4 mg via ORAL
  Filled 2018-12-14: qty 1

## 2018-12-14 NOTE — ED Triage Notes (Signed)
Pt arrives POV for eval N/V onset 1530. Pt reports she had mirena IUD inserted today at [facility] and started vomiting shortly thereafter. Pt here for same frequently. Given ODT zofran in triage.,

## 2018-12-14 NOTE — ED Notes (Addendum)
Pt laying in the floor of the lobby refuses to get up, pt continues to vomit, spoke with MD for orders

## 2018-12-15 MED ORDER — PROMETHAZINE HCL 25 MG/ML IJ SOLN
12.5000 mg | Freq: Once | INTRAMUSCULAR | Status: AC
  Administered 2018-12-15: 12.5 mg via INTRAVENOUS
  Filled 2018-12-15: qty 1

## 2018-12-15 MED ORDER — SODIUM CHLORIDE 0.9 % IV BOLUS
2000.0000 mL | Freq: Once | INTRAVENOUS | Status: AC
  Administered 2018-12-15: 2000 mL via INTRAVENOUS

## 2018-12-15 MED ORDER — PROMETHAZINE HCL 25 MG RE SUPP: 25.0000 mg | Freq: Four times a day (QID) | RECTAL | 0 refills | Status: DC | PRN

## 2018-12-15 MED ORDER — FENTANYL CITRATE (PF) 100 MCG/2ML IJ SOLN
50.0000 ug | Freq: Once | INTRAMUSCULAR | Status: AC
  Administered 2018-12-15: 50 ug via INTRAVENOUS
  Filled 2018-12-15: qty 2

## 2018-12-15 MED ORDER — PROMETHAZINE HCL 12.5 MG PO TABS: 12.5000 mg | ORAL_TABLET | Freq: Four times a day (QID) | ORAL | 0 refills | Status: DC | PRN

## 2018-12-15 NOTE — ED Notes (Signed)
Pt c/o she still very nauseated and on pain. EDP to be notified.

## 2018-12-15 NOTE — Discharge Instructions (Addendum)
Follow up with your doctor for recheck in 2-3 days. Use Phenergan as directed (by mouth if tolerating without vomiting, and suppository if unable to take the medication by mouth).

## 2018-12-15 NOTE — ED Notes (Signed)
Pt had someone to bring her gallon of water into waiting area, pt advised not to drink cause she will continue to vomit. Pt noncompliant.

## 2018-12-15 NOTE — ED Provider Notes (Signed)
[facility] EMERGENCY DEPARTMENT Provider Note   CSN: [id number] Arrival date & time: 12/14/18  1823     History   Chief Complaint Chief Complaint  Patient presents with  . Emesis    HPI Nicki Furlan Cabreja is a 28 y.o. female.     Patient to ED with nausea and vomiting since yesterday afternoon (12/14/18). She reports having an IUD inserted in the morning and by the afternoon was having copious amounts of vomiting with onset sequent epigastric abdominal pain. No fever, SOB, cough, urinary symptoms.   The history is provided by the patient. No language interpreter was used.  Emesis Associated symptoms: abdominal pain   Associated symptoms: no fever     Past Medical History:  Diagnosis Date  . Breast mass in female 01/21/06  . Breast nodule 04/26/10   Left  . BV (bacterial vaginosis) 5.10/11  . Candida vaginitis 04/26/10  . Ectopic pregnancy 03/24/2013  . Fibroadenoma   . H/O varicella   . History of chlamydia   . Irregular menstrual cycle 07/28/07  . 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 Vaginal discharge 10/01/07  . Vaginitis     Patient Active Problem List   Diagnosis Date Noted  . Intractable nausea and vomiting 11/08/2018  . Hypokalemia 11/08/2018  . Leukocytosis 11/08/2018  . Hyponatremia 11/08/2018  . Normal postpartum course 08/27/2018  . Indication for care in labor or delivery 08/26/2018  . Nausea and vomiting during pregnancy 08/16/2018  . Abdominal pain during pregnancy in third trimester 08/16/2018  . Emesis 08/16/2018  . Preterm contractions 08/16/2018  . Preterm labor 07/25/2017  . NSVD (normal spontaneous vaginal delivery) 07/25/2017  . Pregnant 12/17/2016  . Breast cyst, left (excised in 2007) 11/23/2016  . Hyperemesis gravidarum 11/22/2016  . Acute generalized abdominal pain 11/22/2016  . Allergy to penicillin 09/22/2013  . Hx of ectopic pregnancy---s/p left salpingectomy 03/2013 09/22/2013     Past Surgical History:  Procedure Laterality Date  . BREAST CYST EXCISION Left 2007  . DILATION AND CURETTAGE OF UTERUS    . LAPAROSCOPY N/A 03/24/2013   Procedure: LAPAROSCOPY OPERATIVE;  Surgeon: Betsy Coder, MD;  Location: [facility];  Service: Gynecology;  Laterality: N/A;  . Left Salpingectomy  03/24/2013   Ectopic Pregnancy  . UNILATERAL SALPINGECTOMY Left 03/24/2013   Procedure: UNILATERAL SALPINGECTOMY;  Surgeon: Betsy Coder, MD;  Location: [facility];  Service: Gynecology;  Laterality: Left;     OB History    Gravida  5   Para  3   Term  2   Preterm  1   AB  2   Living  3     SAB  1   TAB      Ectopic  1   Multiple  0   Live Births  3            Home Medications    Prior to Admission medications   Medication Sig Start Date End Date Taking? Authorizing Provider  ondansetron (ZOFRAN ODT) 4 MG disintegrating tablet Take 1 tablet (4 mg total) by mouth every 8 (eight) hours as needed for nausea or vomiting. 11/07/18   Deno Etienne, DO  pantoprazole (PROTONIX) 40 MG tablet Take 1 tablet (40 mg total) by mouth daily. 11/09/18 12/09/18  Antonieta Pert, MD  promethazine (PHENERGAN) 12.5 MG tablet Take 1 tablet (12.5 mg total) by mouth every 6 (six) hours as needed for up to 30 doses for nausea or  vomiting. 11/09/18   Antonieta Pert, MD  promethazine (PHENERGAN) 25 MG suppository Place 1 suppository (25 mg total) rectally every 6 (six) hours as needed for nausea or vomiting. 11/25/18   Ward, Ozella Almond, PA-C    Family History Family History  Problem Relation Age of Onset  . Asthma Maternal Grandmother   . Hypertension Maternal Grandmother   . Diabetes Maternal Grandmother   . Hypertension Paternal Grandfather   . Diabetes Paternal Grandfather     Social History Social History   Tobacco Use  . Smoking status: Former Research scientist (life sciences)  . Smokeless tobacco: Never Used  Substance Use Topics  . Alcohol use: No  . Drug use: No     Allergies   Penicillins   Review of  Systems Review of Systems  Constitutional: Negative for fever.  Respiratory: Negative for shortness of breath.   Cardiovascular: Negative for chest pain.  Gastrointestinal: Positive for abdominal pain, nausea and vomiting.  Genitourinary: Negative.      Physical Exam Updated Vital Signs BP 117/79   Pulse (!) 104   Temp 97.9 F (36.6 C) (Oral)   Resp 16   Ht 5\' 2"  (1.575 m)   Wt 49.9 kg   SpO2 92%   BMI 20.12 kg/m   Physical Exam Vitals signs and nursing note reviewed.  Constitutional:      General: She is in acute distress.  HENT:     Head: Normocephalic.     Mouth/Throat:     Mouth: Mucous membranes are dry.  Eyes:     Conjunctiva/sclera: Conjunctivae normal.  Neck:     Musculoskeletal: Normal range of motion and neck supple.  Cardiovascular:     Rate and Rhythm: Normal rate.  Pulmonary:     Effort: Pulmonary effort is normal.  Abdominal:     Palpations: Abdomen is soft.     Tenderness: There is abdominal tenderness (Epigastric tenderness, mild.). There is no guarding.  Skin:    General: Skin is warm and dry.  Neurological:     General: No focal deficit present.     Mental Status: She is oriented to person, place, and time.      ED Treatments / Results  Labs (all labs ordered are listed, but only abnormal results are displayed) Labs Reviewed  COMPREHENSIVE METABOLIC PANEL - Abnormal; Notable for the following components:      Result Value   Potassium 3.4 (*)    CO2 16 (*)    Glucose, Bld 141 (*)    Anion gap 17 (*)    All other components within normal limits  CBC - Abnormal; Notable for the following components:   WBC 11.9 (*)    All other components within normal limits  LIPASE, BLOOD  URINALYSIS, ROUTINE W REFLEX MICROSCOPIC  I-STAT BETA HCG BLOOD, ED ([facility], [facility], [facility] ONLY)    EKG None  Radiology No results found.  Procedures Procedures (including critical care time)  Medications Ordered in ED Medications  sodium chloride flush (NS) 0.9  % injection 3 mL (3 mLs Intravenous Not Given 12/15/18 0353)  fentaNYL (SUBLIMAZE) injection 50 mcg (has no administration in time range)  promethazine (PHENERGAN) injection 12.5 mg (has no administration in time range)  ondansetron (ZOFRAN-ODT) disintegrating tablet 4 mg (4 mg Oral Given 12/14/18 1845)  ondansetron (ZOFRAN-ODT) disintegrating tablet 4 mg (4 mg Oral Given 12/14/18 2020)  sodium chloride 0.9 % bolus 2,000 mL (2,000 mLs Intravenous New Bag/Given 12/15/18 0256)  promethazine (PHENERGAN) injection 12.5 mg (12.5 mg Intravenous  Given 12/15/18 0252)     Initial Impression / Assessment and Plan / ED Course  I have reviewed the triage vital signs and the nursing notes.  Pertinent labs & imaging results that were available during my care of the patient were reviewed by me and considered in my medical decision making (see chart for details).        Patient to ED with nausea and vomiting since this afternoon. History of same. No fever.   IVF's provided with IV phenergan which the patient states has worked best in the past. She was given Zofran on arrival to ED without benefit.   On recheck, she appears more comfortable. She is given ginger ale for a PO challenge. Nausea did return and additional medications provided.   6:30 - No vomiting in over an hour. Patient up to the bathroom without recurrent emesis. She can be discharged home with PCP follow up prn.   Final Clinical Impressions(s) / ED Diagnoses   Final diagnoses:  None   1. Nausea and vomiting  ED Discharge Orders    None       Charlann Lange, Hershal Coria 12/15/18 0708    Maudie Flakes, MD 12/17/18 1115

## 2018-12-15 NOTE — ED Notes (Addendum)
Pt states "my pants are asleep" pants were then found in the toilet, after voiding on herself.

## 2018-12-16 ENCOUNTER — Other Ambulatory Visit: Payer: Self-pay

## 2018-12-16 ENCOUNTER — Encounter (HOSPITAL_BASED_OUTPATIENT_CLINIC_OR_DEPARTMENT_OTHER): Payer: Self-pay | Admitting: Adult Health

## 2018-12-16 ENCOUNTER — Emergency Department (HOSPITAL_BASED_OUTPATIENT_CLINIC_OR_DEPARTMENT_OTHER)
Admission: EM | Admit: 2018-12-16 | Discharge: 2018-12-16 | Disposition: A | Discharge status: Home / Self Care | Payer: BC Managed Care – PPO | Attending: Emergency Medicine | Admitting: Emergency Medicine

## 2018-12-16 ENCOUNTER — Emergency Department (HOSPITAL_COMMUNITY)
Admission: EM | Admit: 2018-12-16 | Discharge: 2018-12-16 | Discharge status: Against Medical Advice | Payer: BC Managed Care – PPO

## 2018-12-16 ENCOUNTER — Emergency Department (HOSPITAL_BASED_OUTPATIENT_CLINIC_OR_DEPARTMENT_OTHER)
Admission: EM | Admit: 2018-12-16 | Discharge: 2018-12-16 | Disposition: A | Discharge status: Home / Self Care | Payer: BC Managed Care – PPO | Source: Home / Self Care | Attending: Emergency Medicine | Admitting: Emergency Medicine

## 2018-12-16 ENCOUNTER — Encounter (HOSPITAL_BASED_OUTPATIENT_CLINIC_OR_DEPARTMENT_OTHER): Payer: Self-pay | Admitting: Emergency Medicine

## 2018-12-16 DIAGNOSIS — R1115 Cyclical vomiting syndrome unrelated to migraine: Secondary | ICD-10-CM

## 2018-12-16 DIAGNOSIS — R112 Nausea with vomiting, unspecified: Secondary | ICD-10-CM | POA: Diagnosis present

## 2018-12-16 DIAGNOSIS — Z79899 Other long term (current) drug therapy: Secondary | ICD-10-CM | POA: Insufficient documentation

## 2018-12-16 DIAGNOSIS — Z87891 Personal history of nicotine dependence: Secondary | ICD-10-CM | POA: Insufficient documentation

## 2018-12-16 LAB — CBC
HCT: 38.1 % (ref 36.0–46.0)
Hemoglobin: 13.1 g/dL (ref 12.0–15.0)
MCH: 32.4 pg (ref 26.0–34.0)
MCHC: 34.4 g/dL (ref 30.0–36.0)
MCV: 94.3 fL (ref 80.0–100.0)
Platelets: 208 10*3/uL (ref 150–400)
RBC: 4.04 MIL/uL (ref 3.87–5.11)
RDW: 12 % (ref 11.5–15.5)
WBC: 9.8 10*3/uL (ref 4.0–10.5)
nRBC: 0 % (ref 0.0–0.2)

## 2018-12-16 LAB — URINALYSIS, ROUTINE W REFLEX MICROSCOPIC
Glucose, UA: NEGATIVE mg/dL
Ketones, ur: >80 mg/dL — AB
Leukocytes,Ua: NEGATIVE
Nitrite: NEGATIVE
Protein, ur: NEGATIVE mg/dL
Specific Gravity, Urine: >1.03 — ABNORMAL HIGH (ref 1.005–1.030)
pH: 6 (ref 5.0–8.0)

## 2018-12-16 LAB — COMPREHENSIVE METABOLIC PANEL
ALT: 21 U/L (ref 0–44)
ALT: 21 U/L (ref 0–44)
AST: 27 U/L (ref 15–41)
AST: 25 U/L (ref 15–41)
Albumin: 4.4 g/dL (ref 3.5–5.0)
Albumin: 4.5 g/dL (ref 3.5–5.0)
Alkaline Phosphatase: 49 U/L (ref 38–126)
Alkaline Phosphatase: 49 U/L (ref 38–126)
Anion gap: 12 (ref 5–15)
Anion gap: 16 — ABNORMAL HIGH (ref 5–15)
BUN: 13 mg/dL (ref 6–20)
BUN: 12 mg/dL (ref 6–20)
CO2: 20 mmol/L — ABNORMAL LOW (ref 22–32)
CO2: 18 mmol/L — ABNORMAL LOW (ref 22–32)
Calcium: 9.2 mg/dL (ref 8.9–10.3)
Calcium: 9.1 mg/dL (ref 8.9–10.3)
Chloride: 105 mmol/L (ref 98–111)
Chloride: 104 mmol/L (ref 98–111)
Creatinine, Ser: 0.65 mg/dL (ref 0.44–1.00)
Creatinine, Ser: 0.7 mg/dL (ref 0.44–1.00)
GFR calc Af Amer: >60 mL/min (ref >60)
GFR calc Af Amer: >60 mL/min (ref >60)
GFR calc non Af Amer: >60 mL/min (ref >60)
GFR calc non Af Amer: >60 mL/min (ref >60)
Glucose, Bld: 118 mg/dL — ABNORMAL HIGH (ref 70–99)
Glucose, Bld: 88 mg/dL (ref 70–99)
Potassium: 3.3 mmol/L — ABNORMAL LOW (ref 3.5–5.1)
Potassium: 3.2 mmol/L — ABNORMAL LOW (ref 3.5–5.1)
Sodium: 137 mmol/L (ref 135–145)
Sodium: 138 mmol/L (ref 135–145)
Total Bilirubin: 1.1 mg/dL (ref 0.3–1.2)
Total Bilirubin: 1.2 mg/dL (ref 0.3–1.2)
Total Protein: 7.3 g/dL (ref 6.5–8.1)
Total Protein: 7.2 g/dL (ref 6.5–8.1)

## 2018-12-16 LAB — URINALYSIS, MICROSCOPIC (REFLEX)

## 2018-12-16 LAB — CBC WITH DIFFERENTIAL/PLATELET
Abs Immature Granulocytes: 0.03 10*3/uL (ref 0.00–0.07)
Basophils Absolute: 0 10*3/uL (ref 0.0–0.1)
Basophils Relative: 0 %
Eosinophils Absolute: 0 10*3/uL (ref 0.0–0.5)
Eosinophils Relative: 0 %
HCT: 37.4 % (ref 36.0–46.0)
Hemoglobin: 12.7 g/dL (ref 12.0–15.0)
Immature Granulocytes: 0 %
Lymphocytes Relative: 11 %
Lymphs Abs: 1.3 10*3/uL (ref 0.7–4.0)
MCH: 31.9 pg (ref 26.0–34.0)
MCHC: 34 g/dL (ref 30.0–36.0)
MCV: 94 fL (ref 80.0–100.0)
Monocytes Absolute: 0.6 10*3/uL (ref 0.1–1.0)
Monocytes Relative: 5 %
Neutro Abs: 9.4 10*3/uL — ABNORMAL HIGH (ref 1.7–7.7)
Neutrophils Relative %: 84 %
Platelets: 217 10*3/uL (ref 150–400)
RBC: 3.98 MIL/uL (ref 3.87–5.11)
RDW: 12 % (ref 11.5–15.5)
WBC: 11.4 10*3/uL — ABNORMAL HIGH (ref 4.0–10.5)
nRBC: 0 % (ref 0.0–0.2)

## 2018-12-16 LAB — PREGNANCY, URINE: Preg Test, Ur: NEGATIVE

## 2018-12-16 LAB — LIPASE, BLOOD: Lipase: 27 U/L (ref 11–51)

## 2018-12-16 MED ORDER — METOCLOPRAMIDE HCL 5 MG/ML IJ SOLN
10.0000 mg | Freq: Once | INTRAMUSCULAR | Status: AC
  Administered 2018-12-16: 10 mg via INTRAVENOUS
  Filled 2018-12-16: qty 2

## 2018-12-16 MED ORDER — ONDANSETRON 4 MG PO TBDP: 4.0000 mg | ORAL_TABLET | Freq: Four times a day (QID) | ORAL | 0 refills | Status: DC | PRN

## 2018-12-16 MED ORDER — HALOPERIDOL LACTATE 5 MG/ML IJ SOLN
2.0000 mg | Freq: Once | INTRAMUSCULAR | Status: AC
  Administered 2018-12-16: 2 mg via INTRAVENOUS
  Filled 2018-12-16: qty 1

## 2018-12-16 MED ORDER — SODIUM CHLORIDE 0.9 % IV BOLUS (SEPSIS)
1000.0000 mL | Freq: Once | INTRAVENOUS | Status: AC
  Administered 2018-12-16: 1000 mL via INTRAVENOUS

## 2018-12-16 MED ORDER — POTASSIUM CHLORIDE CRYS ER 20 MEQ PO TBCR
40.0000 meq | EXTENDED_RELEASE_TABLET | Freq: Once | ORAL | Status: AC
  Administered 2018-12-16: 40 meq via ORAL
  Filled 2018-12-16: qty 2

## 2018-12-16 MED ORDER — SODIUM CHLORIDE 0.9 % IV SOLN
INTRAVENOUS | Status: DC
  Administered 2018-12-16: 20:00:00 via INTRAVENOUS

## 2018-12-16 MED ORDER — DICYCLOMINE HCL 10 MG/ML IM SOLN
20.0000 mg | Freq: Once | INTRAMUSCULAR | Status: AC
  Administered 2018-12-16: 20 mg via INTRAMUSCULAR
  Filled 2018-12-16: qty 2

## 2018-12-16 MED ORDER — SODIUM CHLORIDE 0.9 % IV BOLUS
1000.0000 mL | Freq: Once | INTRAVENOUS | Status: AC
  Administered 2018-12-16: 1000 mL via INTRAVENOUS

## 2018-12-16 MED ORDER — HALOPERIDOL LACTATE 5 MG/ML IJ SOLN
5.0000 mg | Freq: Once | INTRAMUSCULAR | Status: AC
  Administered 2018-12-16: 5 mg via INTRAVENOUS
  Filled 2018-12-16: qty 1

## 2018-12-16 MED ORDER — METOCLOPRAMIDE HCL 10 MG PO TABS: 10.0000 mg | ORAL_TABLET | Freq: Four times a day (QID) | ORAL | 0 refills | Status: DC | PRN

## 2018-12-16 NOTE — ED Provider Notes (Signed)
[facility] EMERGENCY DEPARTMENT Provider Note   CSN: [id number] Arrival date & time: 12/16/18  1634     History   Chief Complaint Chief Complaint  Patient presents with  . Emesis    HPI Debbie Hughes is a 28 y.o. female.     Patient just discharged for cyclical vomiting at about 630 this morning.  Got home slept for couple hours.  And started back in with the nausea and vomiting.  Patient is followed by primary care patient also followed by [facility] gastroenterology.  Has an appointment for an upper endoscopy on August 21.  Patient's had several episodes of vomiting since getting home and multiple episodes of dry heaves.  Patient was treated last evening with IV Reglan and then eventually responded well to IV Haldol.  Patient received IV fluids as well.  Patient's had multiple visits for cyclical type vomiting syndrome.     Past Medical History:  Diagnosis Date  . Breast mass in female 01/21/06  . Breast nodule 04/26/10   Left  . BV (bacterial vaginosis) 5.10/11  . Candida vaginitis 04/26/10  . Ectopic pregnancy 03/24/2013  . Fibroadenoma   . H/O varicella   . History of chlamydia   . Irregular menstrual cycle 07/28/07  . 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 Vaginal discharge 10/01/07  . Vaginitis     Patient Active Problem List   Diagnosis Date Noted  . Intractable nausea and vomiting 11/08/2018  . Hypokalemia 11/08/2018  . Leukocytosis 11/08/2018  . Hyponatremia 11/08/2018  . Normal postpartum course 08/27/2018  . Indication for care in labor or delivery 08/26/2018  . Nausea and vomiting during pregnancy 08/16/2018  . Abdominal pain during pregnancy in third trimester 08/16/2018  . Emesis 08/16/2018  . Preterm contractions 08/16/2018  . Preterm labor 07/25/2017  . NSVD (normal spontaneous vaginal delivery) 07/25/2017  . Pregnant 12/17/2016  . Breast cyst, left (excised in 2007) 11/23/2016  . Hyperemesis gravidarum  11/22/2016  . Acute generalized abdominal pain 11/22/2016  . Allergy to penicillin 09/22/2013  . Hx of ectopic pregnancy---s/p left salpingectomy 03/2013 09/22/2013    Past Surgical History:  Procedure Laterality Date  . BREAST CYST EXCISION Left 2007  . DILATION AND CURETTAGE OF UTERUS    . LAPAROSCOPY N/A 03/24/2013   Procedure: LAPAROSCOPY OPERATIVE;  Surgeon: Betsy Coder, MD;  Location: [facility];  Service: Gynecology;  Laterality: N/A;  . Left Salpingectomy  03/24/2013   Ectopic Pregnancy  . UNILATERAL SALPINGECTOMY Left 03/24/2013   Procedure: UNILATERAL SALPINGECTOMY;  Surgeon: Betsy Coder, MD;  Location: [facility];  Service: Gynecology;  Laterality: Left;     OB History    Gravida  5   Para  3   Term  2   Preterm  1   AB  2   Living  3     SAB  1   TAB      Ectopic  1   Multiple  0   Live Births  3            Home Medications    Prior to Admission medications   Medication Sig Start Date End Date Taking? Authorizing Provider  metoCLOPramide (REGLAN) 10 MG tablet Take 1 tablet (10 mg total) by mouth every 6 (six) hours as needed for nausea. 12/16/18   Ward, Delice Bison, DO  ondansetron (ZOFRAN ODT) 4 MG disintegrating tablet Take 1 tablet (4 mg total) by mouth every 6 (six)  hours as needed for nausea or vomiting. 12/16/18   Ward, Delice Bison, DO  pantoprazole (PROTONIX) 40 MG tablet Take 1 tablet (40 mg total) by mouth daily. 11/09/18 12/09/18  Antonieta Pert, MD  promethazine (PHENERGAN) 12.5 MG tablet Take 1 tablet (12.5 mg total) by mouth every 6 (six) hours as needed for up to 30 doses for nausea or vomiting. 12/15/18   Charlann Lange, PA-C  promethazine (PHENERGAN) 25 MG suppository Place 1 suppository (25 mg total) rectally every 6 (six) hours as needed for nausea or vomiting. 12/15/18   Charlann Lange, PA-C    Family History Family History  Problem Relation Age of Onset  . Asthma Maternal Grandmother   . Hypertension Maternal Grandmother   . Diabetes  Maternal Grandmother   . Hypertension Paternal Grandfather   . Diabetes Paternal Grandfather     Social History Social History   Tobacco Use  . Smoking status: Former Research scientist (life sciences)  . Smokeless tobacco: Never Used  Substance Use Topics  . Alcohol use: No  . Drug use: No     Allergies   Penicillins   Review of Systems Review of Systems  Constitutional: Negative for chills and fever.  HENT: Negative for rhinorrhea and sore throat.   Eyes: Negative for visual disturbance.  Respiratory: Negative for cough and shortness of breath.   Cardiovascular: Negative for chest pain and leg swelling.  Gastrointestinal: Positive for nausea and vomiting. Negative for abdominal pain and diarrhea.  Genitourinary: Negative for dysuria.  Musculoskeletal: Negative for back pain and neck pain.  Skin: Negative for rash.  Neurological: Negative for dizziness, light-headedness and headaches.  Hematological: Does not bruise/bleed easily.  Psychiatric/Behavioral: Negative for confusion.     Physical Exam Updated Vital Signs BP (!) 157/97 (BP Location: Right Arm)   Pulse 74   Temp 99 F (37.2 C) (Oral)   Resp 16   Ht 1.6 m (5\' 3" )   Wt 50.7 kg   LMP  (LMP Unknown)   SpO2 100%   BMI 19.80 kg/m   Physical Exam Vitals signs and nursing note reviewed.  Constitutional:      General: She is not in acute distress.    Appearance: Normal appearance. She is well-developed.  HENT:     Head: Normocephalic and atraumatic.     Mouth/Throat:     Mouth: Mucous membranes are dry.  Eyes:     Extraocular Movements: Extraocular movements intact.     Conjunctiva/sclera: Conjunctivae normal.     Pupils: Pupils are equal, round, and reactive to light.  Neck:     Musculoskeletal: Normal range of motion and neck supple.  Cardiovascular:     Rate and Rhythm: Normal rate and regular rhythm.     Heart sounds: No murmur.  Pulmonary:     Effort: Pulmonary effort is normal. No respiratory distress.     Breath  sounds: Normal breath sounds.  Abdominal:     Palpations: Abdomen is soft.     Tenderness: There is no abdominal tenderness.  Musculoskeletal: Normal range of motion.  Skin:    General: Skin is warm and dry.  Neurological:     General: No focal deficit present.     Mental Status: She is alert and oriented to person, place, and time.      ED Treatments / Results  Labs (all labs ordered are listed, but only abnormal results are displayed) Labs Reviewed  COMPREHENSIVE METABOLIC PANEL - Abnormal; Notable for the following components:      Result  Value   Potassium 3.2 (*)    CO2 18 (*)    Anion gap 16 (*)    All other components within normal limits  CBC    EKG None  Radiology No results found.  Procedures Procedures (including critical care time)  Medications Ordered in ED Medications  0.9 %  sodium chloride infusion ( Intravenous New Bag/Given 12/16/18 1955)  sodium chloride 0.9 % bolus 1,000 mL (0 mLs Intravenous Stopped 12/16/18 1851)  haloperidol lactate (HALDOL) injection 5 mg (5 mg Intravenous Given 12/16/18 1752)  metoCLOPramide (REGLAN) injection 10 mg (10 mg Intravenous Given 12/16/18 1803)  sodium chloride 0.9 % bolus 1,000 mL (0 mLs Intravenous Stopped 12/16/18 1954)     Initial Impression / Assessment and Plan / ED Course  I have reviewed the triage vital signs and the nursing notes.  Pertinent labs & imaging results that were available during my care of the patient were reviewed by me and considered in my medical decision making (see chart for details).        Patient's nausea and vomiting controlled here with IV Reglan and IV Haldol.  Electrolytes without any significant changes or indication for admission.  Overall patient feeling better.  She does have follow-up with [facility] gastroenterology on August 21 to have upper endoscopy.  Patient will continue her medications at home.  She also has primary care follow-up.  Patient received 2 L of normal saline  fluid feeling better.  Patient has urinated here.  Final Clinical Impressions(s) / ED Diagnoses   Final diagnoses:  Cyclical vomiting    ED Discharge Orders    None       Fredia Sorrow, MD 12/16/18 2020

## 2018-12-16 NOTE — ED Notes (Signed)
No emesis since IV reglan.

## 2018-12-16 NOTE — ED Notes (Signed)
Pt encouraged to try po fluids again.

## 2018-12-16 NOTE — ED Notes (Signed)
Pt unable to give urine sample at this time 

## 2018-12-16 NOTE — ED Notes (Signed)
ED Provider at bedside. 

## 2018-12-16 NOTE — Discharge Instructions (Addendum)
Continue your current medications.  Follow-up with the [facility] gastroenterology as scheduled for your upper endoscopy.  Return for any new or worse symptoms.

## 2018-12-16 NOTE — ED Notes (Signed)
Po gingerale given. No emesis. Pt up to BR

## 2018-12-16 NOTE — ED Provider Notes (Signed)
TIME SEEN: 1:47 AM  CHIEF COMPLAINT: Nausea, vomiting  HPI: Patient is a 28 year old female with history of previous ectopic pregnancy who presents to the emergency department with nausea and vomiting for the past 2 days.  States that she was diagnosed with cyclic vomiting and has had multiple episodes since July 4 and has had 2 admissions for intractable vomiting.  States she previously was a marijuana smoker but quit on July 4 when she was told that her symptoms could be secondary to hyperemesis cannabinoid syndrome.  She denies any history of diabetes or gastroparesis.  She does not think that she is pregnant.  She has been taking promethazine suppositories and Protonix at home without relief.  No abdominal pain.  No fevers.  No diarrhea.  No dysuria, hematuria, vaginal bleeding or discharge.  She is being followed by [facility].  ROS: See HPI Constitutional: no fever  Eyes: no drainage  ENT: no runny nose   Cardiovascular:  no chest pain  Resp: no SOB  GI: vomiting GU: no dysuria Integumentary: no rash  Allergy: no hives  Musculoskeletal: no leg swelling  Neurological: no slurred speech ROS otherwise negative  PAST MEDICAL HISTORY/PAST SURGICAL HISTORY:  Past Medical History:  Diagnosis Date  . Breast mass in female 01/21/06  . Breast nodule 04/26/10   Left  . BV (bacterial vaginosis) 5.10/11  . Candida vaginitis 04/26/10  . Ectopic pregnancy 03/24/2013  . Fibroadenoma   . H/O varicella   . History of chlamydia   . Irregular menstrual cycle 07/28/07  . 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 Vaginal discharge 10/01/07  . Vaginitis     MEDICATIONS:  Prior to Admission medications   Medication Sig Start Date End Date Taking? Authorizing Provider  ondansetron (ZOFRAN ODT) 4 MG disintegrating tablet Take 1 tablet (4 mg total) by mouth every 8 (eight) hours as needed for nausea or vomiting. 11/07/18   Deno Etienne, DO  pantoprazole (PROTONIX) 40 MG  tablet Take 1 tablet (40 mg total) by mouth daily. 11/09/18 12/09/18  Antonieta Pert, MD  promethazine (PHENERGAN) 12.5 MG tablet Take 1 tablet (12.5 mg total) by mouth every 6 (six) hours as needed for up to 30 doses for nausea or vomiting. 12/15/18   Charlann Lange, PA-C  promethazine (PHENERGAN) 25 MG suppository Place 1 suppository (25 mg total) rectally every 6 (six) hours as needed for nausea or vomiting. 12/15/18   Charlann Lange, PA-C    ALLERGIES:  Allergies  Allergen Reactions  . Penicillins Rash    Has patient had a PCN reaction causing immediate rash, facial/tongue/throat swelling, SOB or lightheadedness with hypotension: yes Has patient had a PCN reaction causing severe rash involving mucus membranes or skin necrosis: no Has patient had a PCN reaction that required hospitalization no Has patient had a PCN reaction occurring within the last 10 years: yes If all of the above answers are "NO", then may proceed with Cephalosporin use.     SOCIAL HISTORY:  Social History   Tobacco Use  . Smoking status: Former Research scientist (life sciences)  . Smokeless tobacco: Never Used  Substance Use Topics  . Alcohol use: No    FAMILY HISTORY: Family History  Problem Relation Age of Onset  . Asthma Maternal Grandmother   . Hypertension Maternal Grandmother   . Diabetes Maternal Grandmother   . Hypertension Paternal Grandfather   . Diabetes Paternal Grandfather     EXAM: BP (!) 160/104 (BP Location: Right Arm)   Pulse Marland Kitchen)  102   Temp 98.6 F (37 C) (Oral)   Resp 20   Ht 5\' 2"  (1.575 m)   Wt 49 kg   SpO2 100%   BMI 19.76 kg/m  CONSTITUTIONAL: Alert and oriented and responds appropriately to questions. Well-appearing; well-nourished HEAD: Normocephalic EYES: Conjunctivae clear, pupils appear equal, EOMI ENT: normal nose; moist mucous membranes NECK: Supple, no meningismus, no nuchal rigidity, no LAD  CARD: Regular and tachycardic; S1 and S2 appreciated; no murmurs, no clicks, no rubs, no gallops RESP:  Normal chest excursion without splinting or tachypnea; breath sounds clear and equal bilaterally; no wheezes, no rhonchi, no rales, no hypoxia or respiratory distress, speaking full sentences ABD/GI: Normal bowel sounds; non-distended; soft, non-tender, no rebound, no guarding, no peritoneal signs, no hepatosplenomegaly BACK:  The back appears normal and is non-tender to palpation, there is no CVA tenderness EXT: Normal ROM in all joints; non-tender to palpation; no edema; normal capillary refill; no cyanosis, no calf tenderness or swelling    SKIN: Normal color for age and race; warm; no rash NEURO: Moves all extremities equally PSYCH: The patient's mood and manner are appropriate. Grooming and personal hygiene are appropriate.  MEDICAL DECISION MAKING: Patient here with vomiting.  States Zofran does not normally help her symptoms.  Has been using Phenergan at home without relief.  Will give dose of IV Reglan.  Her abdominal exam is benign.  Doubt appendicitis, colitis, diverticulitis, bowel obstruction, perforation.  States she has been seen by [facility] gastroenterology through virtual visit.  She has follow-up scheduled with GI.  ED PROGRESS: Labs show mild leukocytosis with left shift.  Slightly low potassium of 3.3.  LFTs, lipase normal.  Patient reports feeling better after IV Reglan.  Will p.o. challenge.  Urine pending.  4:10 AM  Urine shows no sign of infection but does show large ketones.  Pregnancy test negative.  Patient began vomiting again after drinking ginger ale and trying to eat crackers.  Will give dose of IV Haldol.  Complaining of some abdominal cramping.  Will give IM Bentyl.  6:33 AM  Pt reports feeling much better and has not had any further vomiting.  She has been able to tolerate a repeat fluid challenge and eating crackers.  She would like to try to go home.  Will provide with prescription for Zofran and Reglan.  She has Phenergan at home.  Discussed return precautions.  She has  GI follow-up.   At this time, I do not feel there is any life-threatening condition present. I have reviewed and discussed all results (EKG, imaging, lab, urine as appropriate) and exam findings with patient/family. I have reviewed nursing notes and appropriate previous records.  I feel the patient is safe to be discharged home without further emergent workup and can continue workup as an outpatient as needed. Discussed usual and customary return precautions. Patient/family verbalize understanding and are comfortable with this plan.  Outpatient follow-up has been provided as needed. All questions have been answered.    Dace Denn, Delice Bison, DO 12/16/18 [phone]

## 2018-12-16 NOTE — ED Triage Notes (Signed)
Presents with nausea and active vomiting. She has hx of the same over the past few months. This episode began 36 hours ago, she was seen at [facility] yesterday and given RX. She states the RX do not work. She is currently dry heaving.

## 2018-12-16 NOTE — ED Notes (Signed)
Pt with emesis. MD aware.

## 2018-12-16 NOTE — Discharge Instructions (Signed)
Please follow-up with your PCP and gastroenterologist.  You may alternate between Zofran, promethazine and Reglan as needed for nausea and vomiting.

## 2018-12-16 NOTE — ED Triage Notes (Signed)
Pt DC'd this morning with same complaint. Has been vomiting all day.  Did not get the prescriptions she was given at discharge. Took Phenergan Supp at noon but it didn't work.  Pt drove herself to the ED.

## 2018-12-28 ENCOUNTER — Ambulatory Visit
Admission: RE | Admit: 2018-12-28 | Discharge: 2018-12-28 | Disposition: A | Discharge status: Home / Self Care | Payer: BC Managed Care – PPO | Source: Ambulatory Visit | Attending: Gastroenterology | Admitting: Gastroenterology

## 2018-12-28 DIAGNOSIS — R112 Nausea with vomiting, unspecified: Secondary | ICD-10-CM

## 2019-02-21 ENCOUNTER — Other Ambulatory Visit: Payer: Self-pay

## 2019-02-21 ENCOUNTER — Emergency Department (HOSPITAL_BASED_OUTPATIENT_CLINIC_OR_DEPARTMENT_OTHER): Payer: BC Managed Care – PPO

## 2019-02-21 ENCOUNTER — Emergency Department (HOSPITAL_BASED_OUTPATIENT_CLINIC_OR_DEPARTMENT_OTHER)
Admission: EM | Admit: 2019-02-21 | Discharge: 2019-02-21 | Disposition: A | Discharge status: Home / Self Care | Payer: BC Managed Care – PPO | Attending: Emergency Medicine | Admitting: Emergency Medicine

## 2019-02-21 ENCOUNTER — Encounter (HOSPITAL_BASED_OUTPATIENT_CLINIC_OR_DEPARTMENT_OTHER): Payer: Self-pay | Admitting: *Deleted

## 2019-02-21 DIAGNOSIS — R112 Nausea with vomiting, unspecified: Secondary | ICD-10-CM | POA: Diagnosis present

## 2019-02-21 DIAGNOSIS — Z87891 Personal history of nicotine dependence: Secondary | ICD-10-CM | POA: Insufficient documentation

## 2019-02-21 DIAGNOSIS — R1030 Lower abdominal pain, unspecified: Secondary | ICD-10-CM | POA: Insufficient documentation

## 2019-02-21 LAB — URINALYSIS, ROUTINE W REFLEX MICROSCOPIC
Bilirubin Urine: NEGATIVE
Glucose, UA: NEGATIVE mg/dL
Ketones, ur: >80 mg/dL — AB
Leukocytes,Ua: NEGATIVE
Nitrite: NEGATIVE
Protein, ur: NEGATIVE mg/dL
Specific Gravity, Urine: 1.015 (ref 1.005–1.030)
pH: 7.5 (ref 5.0–8.0)

## 2019-02-21 LAB — CBC WITH DIFFERENTIAL/PLATELET
Abs Immature Granulocytes: 0.1 10*3/uL — ABNORMAL HIGH (ref 0.00–0.07)
Basophils Absolute: 0 10*3/uL (ref 0.0–0.1)
Basophils Relative: 0 %
Eosinophils Absolute: 0 10*3/uL (ref 0.0–0.5)
Eosinophils Relative: 0 %
HCT: 38.9 % (ref 36.0–46.0)
Hemoglobin: 12.8 g/dL (ref 12.0–15.0)
Immature Granulocytes: 1 %
Lymphocytes Relative: 7 %
Lymphs Abs: 1.1 10*3/uL (ref 0.7–4.0)
MCH: 32.2 pg (ref 26.0–34.0)
MCHC: 32.9 g/dL (ref 30.0–36.0)
MCV: 97.7 fL (ref 80.0–100.0)
Monocytes Absolute: 0.8 10*3/uL (ref 0.1–1.0)
Monocytes Relative: 5 %
Neutro Abs: 15 10*3/uL — ABNORMAL HIGH (ref 1.7–7.7)
Neutrophils Relative %: 87 %
Platelets: 251 10*3/uL (ref 150–400)
RBC: 3.98 MIL/uL (ref 3.87–5.11)
RDW: 12 % (ref 11.5–15.5)
WBC: 17.1 10*3/uL — ABNORMAL HIGH (ref 4.0–10.5)
nRBC: 0 % (ref 0.0–0.2)

## 2019-02-21 LAB — COMPREHENSIVE METABOLIC PANEL
ALT: 28 U/L (ref 0–44)
AST: 39 U/L (ref 15–41)
Albumin: 4.7 g/dL (ref 3.5–5.0)
Alkaline Phosphatase: 59 U/L (ref 38–126)
Anion gap: 17 — ABNORMAL HIGH (ref 5–15)
BUN: 14 mg/dL (ref 6–20)
CO2: 17 mmol/L — ABNORMAL LOW (ref 22–32)
Calcium: 9.6 mg/dL (ref 8.9–10.3)
Chloride: 109 mmol/L (ref 98–111)
Creatinine, Ser: 0.63 mg/dL (ref 0.44–1.00)
GFR calc Af Amer: >60 mL/min (ref >60)
GFR calc non Af Amer: >60 mL/min (ref >60)
Glucose, Bld: 107 mg/dL — ABNORMAL HIGH (ref 70–99)
Potassium: 3.8 mmol/L (ref 3.5–5.1)
Sodium: 143 mmol/L (ref 135–145)
Total Bilirubin: 0.8 mg/dL (ref 0.3–1.2)
Total Protein: 8 g/dL (ref 6.5–8.1)

## 2019-02-21 LAB — URINALYSIS, MICROSCOPIC (REFLEX)

## 2019-02-21 LAB — LIPASE, BLOOD: Lipase: 23 U/L (ref 11–51)

## 2019-02-21 LAB — PREGNANCY, URINE: Preg Test, Ur: NEGATIVE

## 2019-02-21 MED ORDER — IOHEXOL 300 MG/ML  SOLN
100.0000 mL | Freq: Once | INTRAMUSCULAR | Status: AC | PRN
  Administered 2019-02-21: 80 mL via INTRAVENOUS

## 2019-02-21 MED ORDER — HALOPERIDOL LACTATE 5 MG/ML IJ SOLN
5.0000 mg | Freq: Once | INTRAMUSCULAR | Status: AC
  Administered 2019-02-21: 15:00:00 5 mg via INTRAVENOUS
  Filled 2019-02-21: qty 1

## 2019-02-21 MED ORDER — HALOPERIDOL LACTATE 5 MG/ML IJ SOLN: 5.0000 mg | Freq: Once | INTRAMUSCULAR | Status: DC

## 2019-02-21 MED ORDER — SODIUM CHLORIDE 0.9 % IV BOLUS
1000.0000 mL | Freq: Once | INTRAVENOUS | Status: AC
  Administered 2019-02-21: 1000 mL via INTRAVENOUS

## 2019-02-21 NOTE — ED Notes (Signed)
ED Provider at bedside. 

## 2019-02-21 NOTE — ED Notes (Signed)
Patient transported to CT 

## 2019-02-21 NOTE — ED Notes (Signed)
Pt tolerating ginger ale at this time.

## 2019-02-21 NOTE — Discharge Instructions (Signed)
As we discussed, your labs did show a slightly elevated white blood cell count.  This could be from vomiting but can also indicate infection.  Your CT scan did not see your appendix but did not notice any surrounding inflammatory changes.  As we discussed, you will need a repeat evaluation in 24 hours.  You can return here in to the emergency department and to ensure that somebody evaluates you and sees how your abdominal tenderness is doing.  Return to emergency department sooner if your pain gets worse, vomiting gets worse, you have fever or any other worsening or concerning symptoms.

## 2019-02-21 NOTE — ED Provider Notes (Signed)
[facility] EMERGENCY DEPARTMENT Provider Note   CSN: [id number] Arrival date & time: 02/21/19  1337     History   Chief Complaint Chief Complaint  Patient presents with  . Vomiting    HPI Debbie Hughes is a 28 y.o. female with past medical history of ectopic pregnancy, chlamydia, cyclical vomiting who presents for evaluation of nausea/vomiting, abdominal pain that began at 7 AM this morning.  She reports that she has had multiple episodes of nonbloody, nonbilious vomiting.  She has not been tolerating p.o.  She took Phenergan both tablet and suppository with no improvement.  Patient states that she did drink alcohol last night.  She denies any other drug use.  She states her last marijuana use was about a month ago.  She reports that she has had some episodes of nonbloody diarrhea.  She reports that her pain is in the lower abdomen and states that it feels "tender."  She denies any fevers, chest pain, difficulty breathing, urinary complaints.     The history is provided by the patient.    Past Medical History:  Diagnosis Date  . Breast mass in female 01/21/06  . Breast nodule 04/26/10   Left  . BV (bacterial vaginosis) 5.10/11  . Candida vaginitis 04/26/10  . Ectopic pregnancy 03/24/2013  . Fibroadenoma   . H/O varicella   . History of chlamydia   . Irregular menstrual cycle 07/28/07  . 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 Vaginal discharge 10/01/07  . Vaginitis     Patient Active Problem List   Diagnosis Date Noted  . Intractable nausea and vomiting 11/08/2018  . Hypokalemia 11/08/2018  . Leukocytosis 11/08/2018  . Hyponatremia 11/08/2018  . Normal postpartum course 08/27/2018  . Indication for care in labor or delivery 08/26/2018  . Nausea and vomiting during pregnancy 08/16/2018  . Abdominal pain during pregnancy in third trimester 08/16/2018  . Emesis 08/16/2018  . Preterm contractions 08/16/2018  . Preterm labor  07/25/2017  . NSVD (normal spontaneous vaginal delivery) 07/25/2017  . Pregnant 12/17/2016  . Breast cyst, left (excised in 2007) 11/23/2016  . Hyperemesis gravidarum 11/22/2016  . Acute generalized abdominal pain 11/22/2016  . Allergy to penicillin 09/22/2013  . Hx of ectopic pregnancy---s/p left salpingectomy 03/2013 09/22/2013    Past Surgical History:  Procedure Laterality Date  . BREAST CYST EXCISION Left 2007  . DILATION AND CURETTAGE OF UTERUS    . LAPAROSCOPY N/A 03/24/2013   Procedure: LAPAROSCOPY OPERATIVE;  Surgeon: Betsy Coder, MD;  Location: [facility];  Service: Gynecology;  Laterality: N/A;  . Left Salpingectomy  03/24/2013   Ectopic Pregnancy  . UNILATERAL SALPINGECTOMY Left 03/24/2013   Procedure: UNILATERAL SALPINGECTOMY;  Surgeon: Betsy Coder, MD;  Location: [facility];  Service: Gynecology;  Laterality: Left;     OB History    Gravida  5   Para  3   Term  2   Preterm  1   AB  2   Living  3     SAB  1   TAB      Ectopic  1   Multiple  0   Live Births  3            Home Medications    Prior to Admission medications   Medication Sig Start Date End Date Taking? Authorizing Provider  metoCLOPramide (REGLAN) 10 MG tablet Take 1 tablet (10 mg total) by mouth every 6 (six) hours as  needed for nausea. 12/16/18   Ward, Delice Bison, DO  ondansetron (ZOFRAN ODT) 4 MG disintegrating tablet Take 1 tablet (4 mg total) by mouth every 6 (six) hours as needed for nausea or vomiting. 12/16/18   Ward, Delice Bison, DO  pantoprazole (PROTONIX) 40 MG tablet Take 1 tablet (40 mg total) by mouth daily. 11/09/18 12/09/18  Antonieta Pert, MD  promethazine (PHENERGAN) 12.5 MG tablet Take 1 tablet (12.5 mg total) by mouth every 6 (six) hours as needed for up to 30 doses for nausea or vomiting. 12/15/18   Charlann Lange, PA-C  promethazine (PHENERGAN) 25 MG suppository Place 1 suppository (25 mg total) rectally every 6 (six) hours as needed for nausea or vomiting. 12/15/18    Charlann Lange, PA-C    Family History Family History  Problem Relation Age of Onset  . Asthma Maternal Grandmother   . Hypertension Maternal Grandmother   . Diabetes Maternal Grandmother   . Hypertension Paternal Grandfather   . Diabetes Paternal Grandfather     Social History Social History   Tobacco Use  . Smoking status: Former Research scientist (life sciences)  . Smokeless tobacco: Never Used  Substance Use Topics  . Alcohol use: No  . Drug use: No     Allergies   Penicillins   Review of Systems Review of Systems  Constitutional: Negative for fever.  Respiratory: Negative for cough and shortness of breath.   Cardiovascular: Negative for chest pain.  Gastrointestinal: Positive for abdominal pain, diarrhea, nausea and vomiting. Negative for blood in stool.  Genitourinary: Negative for dysuria and hematuria.  Neurological: Negative for headaches.  All other systems reviewed and are negative.    Physical Exam Updated Vital Signs BP 112/72 (BP Location: Right Arm)   Pulse 74   Temp 98.2 F (36.8 C) (Oral)   Resp 16   Ht 5\' 3"  (1.6 m)   Wt 49.9 kg   SpO2 100%   BMI 19.49 kg/m   Physical Exam Vitals signs and nursing note reviewed.  Constitutional:      Appearance: Normal appearance. She is well-developed.     Comments: Actively vomiting  HENT:     Head: Normocephalic and atraumatic.  Eyes:     General: Lids are normal.     Conjunctiva/sclera: Conjunctivae normal.     Pupils: Pupils are equal, round, and reactive to light.  Neck:     Musculoskeletal: Full passive range of motion without pain.  Cardiovascular:     Rate and Rhythm: Normal rate and regular rhythm.     Pulses: Normal pulses.     Heart sounds: Normal heart sounds. No murmur. No friction rub. No gallop.   Pulmonary:     Effort: Pulmonary effort is normal.     Breath sounds: Normal breath sounds.  Abdominal:     Palpations: Abdomen is soft. Abdomen is not rigid.     Tenderness: There is abdominal tenderness in  the right lower quadrant, suprapubic area and left lower quadrant. There is no guarding.     Comments: Abdomen is soft, nondistended.  No rigidity, guarding.  Diffuse tenderness noted to lower abdomen no focal point  Musculoskeletal: Normal range of motion.  Skin:    General: Skin is warm and dry.     Capillary Refill: Capillary refill takes less than 2 seconds.  Neurological:     Mental Status: She is alert and oriented to person, place, and time.  Psychiatric:        Speech: Speech normal.  ED Treatments / Results  Labs (all labs ordered are listed, but only abnormal results are displayed) Labs Reviewed  URINALYSIS, ROUTINE W REFLEX MICROSCOPIC - Abnormal; Notable for the following components:      Result Value   Hgb urine dipstick TRACE (*)    Ketones, ur >80 (*)    All other components within normal limits  COMPREHENSIVE METABOLIC PANEL - Abnormal; Notable for the following components:   CO2 17 (*)    Glucose, Bld 107 (*)    Anion gap 17 (*)    All other components within normal limits  CBC WITH DIFFERENTIAL/PLATELET - Abnormal; Notable for the following components:   WBC 17.1 (*)    Neutro Abs 15.0 (*)    Abs Immature Granulocytes 0.10 (*)    All other components within normal limits  URINALYSIS, MICROSCOPIC (REFLEX) - Abnormal; Notable for the following components:   Bacteria, UA FEW (*)    All other components within normal limits  PREGNANCY, URINE  LIPASE, BLOOD    EKG None  Radiology Ct Abdomen Pelvis W Contrast  Result Date: 02/21/2019 CLINICAL DATA:  Abdominal pain, nausea, vomiting EXAM: CT ABDOMEN AND PELVIS WITH CONTRAST TECHNIQUE: Multidetector CT imaging of the abdomen and pelvis was performed using the standard protocol following bolus administration of intravenous contrast. CONTRAST:  22mL OMNIPAQUE IOHEXOL 300 MG/ML  SOLN COMPARISON:  11/08/2018 FINDINGS: Lower chest: No acute abnormality. Hepatobiliary: No focal liver abnormality is seen. No  gallstones, gallbladder wall thickening, or biliary dilatation. Pancreas: Unremarkable. No pancreatic ductal dilatation or surrounding inflammatory changes. Spleen: Normal in size without focal abnormality. Adrenals/Urinary Tract: Unremarkable adrenal glands. Small stone within the left kidney is unchanged. There is no hydronephrosis. Kidneys enhance symmetrically. No ureteral stone. Urinary bladder appears unremarkable. Stomach/Bowel: Grossly unremarkable. Evaluation of individual bowel loops is degraded by paucity of intra-abdominal fat. There is no dilated loops of bowel. No evidence of focal thickening or inflammatory change. An appendix is not clearly visualized. Vascular/Lymphatic: No significant vascular findings are present. No enlarged abdominal or pelvic lymph nodes. Reproductive: IUD present within the endometrial canal, new from prior. Adnexal regions are within normal limits. Other: Trace free fluid within the pelvis, decreased from prior, and is likely physiologic. No abdominal wall hernia. Musculoskeletal: No acute or significant osseous findings. IMPRESSION: 1. No acute abdominopelvic findings. 2. Non-obstructing left renal calculus. 3. Trace free fluid in the pelvis, likely physiologic. Electronically Signed   By: Davina Poke M.D.   On: 02/21/2019 16:16    Procedures Procedures (including critical care time)  Medications Ordered in ED Medications  sodium chloride 0.9 % bolus 1,000 mL (0 mLs Intravenous Stopped 02/21/19 1803)  haloperidol lactate (HALDOL) injection 5 mg (5 mg Intravenous Given 02/21/19 1439)  iohexol (OMNIPAQUE) 300 MG/ML solution 100 mL (80 mLs Intravenous Contrast Given 02/21/19 1551)  sodium chloride 0.9 % bolus 1,000 mL (0 mLs Intravenous Stopped 02/21/19 1803)     Initial Impression / Assessment and Plan / ED Course  I have reviewed the triage vital signs and the nursing notes.  Pertinent labs & imaging results that were available during my care of the  patient were reviewed by me and considered in my medical decision making (see chart for details).        28 year old female who presents for evaluation of vomiting, abdominal pain that began this morning at 7 AM.  Endorses alcohol use last night.  No recent marijuana use. Patient is afebrile, non-toxic appearing, sitting comfortably on examination table. Vital  signs reviewed and stable.  She has some diffuse abdominal tenderness is actively vomiting.  Consider vomiting after alcohol use versus gastritis versus viral GI process.  History/physical exam not concerning for appendicitis, obstruction, diverticulitis.  Doubt kidney stone given presentation.  Plan to check labs, give fluids and antiemetics.  CBC shows leukocytosis of 17.1.  CMP shows bicarb of 17, anion gap of 17.  Suspect this is likely from dehydration.  UA negative for any infectious etiology.  Urine pregnancy negative.  Repeat abdominal exam after Haldol.  She reports that vomiting has improved but she still having some pain.  On repeat exam, she is more tender in the right lower quadrant, specifically at McBurney's point.  Concern for possible appendicitis given her leukocytosis, vomiting and abdominal pain.  I discussed treatment options with patient and she.  Will for CT and pelvis here in the ED.  CT abdomen pelvis shows no acute abdominopelvic findings.  Appendix was unable to be visualized but there is no surrounding inflammatory changes.  Repeat evaluation.  Patient does report improvement in pain.  Repeat abdominal exam is slightly improved though she still has some mild tenderness noted to the right lower quadrant.  No rigidity, guarding.  She states she feels better and has not had any more vomiting.  We will plan to p.o. challenge.  I discussed at length regarding her CT findings engaged in shared decision making.  I gave the option of further observation and possible surgery consult.  Additionally, we discussed the possibility  of her going home and closely monitoring symptoms with repeat evaluation 24 hours.  Patient states she feels good enough and that she would like to go home.  She does not wish to stay any longer or transfer for further evaluation.  Portions of this note were generated with [employer]. Dictation errors may occur despite best attempts at proofreading.   Final Clinical Impressions(s) / ED Diagnoses   Final diagnoses:  Lower abdominal pain  Non-intractable vomiting with nausea, unspecified vomiting type    ED Discharge Orders    None       Desma Mcgregor 02/21/19 1829    Hayden Rasmussen, MD 02/22/19 [id number]

## 2019-02-21 NOTE — ED Triage Notes (Signed)
Pt drank ETOH last night to celebrate a birthday and started vomiting this morning.

## 2019-02-22 ENCOUNTER — Other Ambulatory Visit: Payer: Self-pay

## 2019-02-22 ENCOUNTER — Encounter (HOSPITAL_BASED_OUTPATIENT_CLINIC_OR_DEPARTMENT_OTHER): Payer: Self-pay | Admitting: *Deleted

## 2019-02-22 ENCOUNTER — Emergency Department (HOSPITAL_BASED_OUTPATIENT_CLINIC_OR_DEPARTMENT_OTHER)
Admission: EM | Admit: 2019-02-22 | Discharge: 2019-02-23 | Disposition: A | Discharge status: Home / Self Care | Payer: BC Managed Care – PPO | Attending: Emergency Medicine | Admitting: Emergency Medicine

## 2019-02-22 DIAGNOSIS — R112 Nausea with vomiting, unspecified: Secondary | ICD-10-CM | POA: Diagnosis not present

## 2019-02-22 DIAGNOSIS — Z87891 Personal history of nicotine dependence: Secondary | ICD-10-CM | POA: Insufficient documentation

## 2019-02-22 DIAGNOSIS — R1030 Lower abdominal pain, unspecified: Secondary | ICD-10-CM | POA: Diagnosis present

## 2019-02-22 DIAGNOSIS — Z79899 Other long term (current) drug therapy: Secondary | ICD-10-CM | POA: Diagnosis not present

## 2019-02-22 LAB — URINALYSIS, ROUTINE W REFLEX MICROSCOPIC
Glucose, UA: NEGATIVE mg/dL
Ketones, ur: >80 mg/dL — AB
Leukocytes,Ua: NEGATIVE
Nitrite: NEGATIVE
Protein, ur: 100 mg/dL — AB
Specific Gravity, Urine: >1.03 — ABNORMAL HIGH (ref 1.005–1.030)
pH: 6.5 (ref 5.0–8.0)

## 2019-02-22 LAB — COMPREHENSIVE METABOLIC PANEL
ALT: 31 U/L (ref 0–44)
AST: 32 U/L (ref 15–41)
Albumin: 5.3 g/dL — ABNORMAL HIGH (ref 3.5–5.0)
Alkaline Phosphatase: 67 U/L (ref 38–126)
Anion gap: 16 — ABNORMAL HIGH (ref 5–15)
BUN: 17 mg/dL (ref 6–20)
CO2: 26 mmol/L (ref 22–32)
Calcium: 9.9 mg/dL (ref 8.9–10.3)
Chloride: 97 mmol/L — ABNORMAL LOW (ref 98–111)
Creatinine, Ser: 0.68 mg/dL (ref 0.44–1.00)
GFR calc Af Amer: >60 mL/min (ref >60)
GFR calc non Af Amer: >60 mL/min (ref >60)
Glucose, Bld: 103 mg/dL — ABNORMAL HIGH (ref 70–99)
Potassium: 3.3 mmol/L — ABNORMAL LOW (ref 3.5–5.1)
Sodium: 139 mmol/L (ref 135–145)
Total Bilirubin: 1.1 mg/dL (ref 0.3–1.2)
Total Protein: 9 g/dL — ABNORMAL HIGH (ref 6.5–8.1)

## 2019-02-22 LAB — WET PREP, GENITAL
Clue Cells Wet Prep HPF POC: NONE SEEN
Sperm: NONE SEEN
Trich, Wet Prep: NONE SEEN
Yeast Wet Prep HPF POC: NONE SEEN

## 2019-02-22 LAB — CBC
HCT: 43.2 % (ref 36.0–46.0)
Hemoglobin: 14.7 g/dL (ref 12.0–15.0)
MCH: 32.7 pg (ref 26.0–34.0)
MCHC: 34 g/dL (ref 30.0–36.0)
MCV: 96.2 fL (ref 80.0–100.0)
Platelets: 240 10*3/uL (ref 150–400)
RBC: 4.49 MIL/uL (ref 3.87–5.11)
RDW: 12.3 % (ref 11.5–15.5)
WBC: 13.9 10*3/uL — ABNORMAL HIGH (ref 4.0–10.5)
nRBC: 0 % (ref 0.0–0.2)

## 2019-02-22 LAB — URINALYSIS, MICROSCOPIC (REFLEX)

## 2019-02-22 LAB — HCG, QUANTITATIVE, PREGNANCY: hCG, Beta Chain, Quant, S: <1 m[IU]/mL (ref <5)

## 2019-02-22 LAB — LIPASE, BLOOD: Lipase: 61 U/L — ABNORMAL HIGH (ref 11–51)

## 2019-02-22 MED ORDER — SODIUM CHLORIDE 0.9 % IV BOLUS
1000.0000 mL | Freq: Once | INTRAVENOUS | Status: AC
  Administered 2019-02-23: 01:00:00 1000 mL via INTRAVENOUS

## 2019-02-22 MED ORDER — ONDANSETRON HCL 4 MG/2ML IJ SOLN
4.0000 mg | Freq: Once | INTRAMUSCULAR | Status: AC | PRN
  Administered 2019-02-22: 4 mg via INTRAVENOUS
  Filled 2019-02-22: qty 2

## 2019-02-22 MED ORDER — HALOPERIDOL LACTATE 5 MG/ML IJ SOLN
5.0000 mg | Freq: Once | INTRAMUSCULAR | Status: AC
  Administered 2019-02-22: 5 mg via INTRAVENOUS
  Filled 2019-02-22: qty 1

## 2019-02-22 MED ORDER — POTASSIUM CHLORIDE CRYS ER 20 MEQ PO TBCR
40.0000 meq | EXTENDED_RELEASE_TABLET | Freq: Once | ORAL | Status: AC
  Administered 2019-02-23: 01:00:00 40 meq via ORAL
  Filled 2019-02-22: qty 2

## 2019-02-22 MED ORDER — SODIUM CHLORIDE 0.9% FLUSH
3.0000 mL | Freq: Once | INTRAVENOUS | Status: DC
  Filled 2019-02-22: qty 3

## 2019-02-22 NOTE — ED Provider Notes (Signed)
[facility] EMERGENCY DEPARTMENT Provider Note   CSN: [id number] Arrival date & time: 02/22/19  1731     History   Chief Complaint Chief Complaint  Patient presents with   Abdominal Pain   Emesis    HPI Debbie Hughes is a 28 y.o. female with a past medical history of ectopic pregnancy, who presents today for evaluation of lower abdominal pain. She was seen here yesterday for the same.  Yesterday morning at 7 AM she woke up with nausea vomiting and abdominal pain.  She states that at this point she is dry heaving and she has not been able to eat or drink anything since.  She states that she has a history of Phenergan suppositories at home however continues to vomit.  She did drink alcohol the night before her symptoms started.  She denies any fevers, chest pain, shortness of breath.  No dysuria increased frequency or urgency. Chart review shows that she has multiple visits for cyclic vomiting. It looks like she usually gets relief from Haldol for her vomiting and abdominal pain.  States that frequently when she has nausea and vomiting like this she will get lower abdominal pain and this is not abnormal for her.  Her primary concern is inability to control her nausea and vomiting at home. She did not have a pelvic exam performed yesterday.     HPI  Past Medical History:  Diagnosis Date   Breast mass in female 01/21/06   Breast nodule 04/26/10   Left   BV (bacterial vaginosis) 5.10/11   Candida vaginitis 04/26/10   Ectopic pregnancy 03/24/2013   Fibroadenoma    H/O varicella    History of chlamydia    Irregular menstrual cycle 07/28/07   LGSIL (low grade squamous intraepithelial lesion) on Pap smear 06/22/07   Vaginal discharge 10/01/07   Vaginitis     Patient Active Problem List   Diagnosis Date Noted   Intractable nausea and vomiting 11/08/2018   Hypokalemia 11/08/2018   Leukocytosis 11/08/2018   Hyponatremia 11/08/2018   Normal  postpartum course 08/27/2018   Indication for care in labor or delivery 08/26/2018   Nausea and vomiting during pregnancy 08/16/2018   Abdominal pain during pregnancy in third trimester 08/16/2018   Emesis 08/16/2018   Preterm contractions 08/16/2018   Preterm labor 07/25/2017   NSVD (normal spontaneous vaginal delivery) 07/25/2017   Pregnant 12/17/2016   Breast cyst, left (excised in 2007) 11/23/2016   Hyperemesis gravidarum 11/22/2016   Acute generalized abdominal pain 11/22/2016   Allergy to penicillin 09/22/2013   Hx of ectopic pregnancy---s/p left salpingectomy 03/2013 09/22/2013    Past Surgical History:  Procedure Laterality Date   BREAST CYST EXCISION Left 2007   DILATION AND CURETTAGE OF UTERUS     LAPAROSCOPY N/A 03/24/2013   Procedure: LAPAROSCOPY OPERATIVE;  Surgeon: Betsy Coder, MD;  Location: [facility];  Service: Gynecology;  Laterality: N/A;   Left Salpingectomy  03/24/2013   Ectopic Pregnancy   UNILATERAL SALPINGECTOMY Left 03/24/2013   Procedure: UNILATERAL SALPINGECTOMY;  Surgeon: Betsy Coder, MD;  Location: [facility];  Service: Gynecology;  Laterality: Left;     OB History    Gravida  5   Para  3   Term  2   Preterm  1   AB  2   Living  3     SAB  1   TAB      Ectopic  1   Multiple  0   Live  Births  3            Home Medications    Prior to Admission medications   Medication Sig Start Date End Date Taking? Authorizing Provider  metoCLOPramide (REGLAN) 10 MG tablet Take 1 tablet (10 mg total) by mouth every 6 (six) hours as needed for nausea. 12/16/18   Ward, Delice Bison, DO  ondansetron (ZOFRAN ODT) 4 MG disintegrating tablet Take 1 tablet (4 mg total) by mouth every 6 (six) hours as needed for nausea or vomiting. 12/16/18   Ward, Delice Bison, DO  pantoprazole (PROTONIX) 40 MG tablet Take 1 tablet (40 mg total) by mouth daily. 11/09/18 12/09/18  Antonieta Pert, MD  promethazine (PHENERGAN) 12.5 MG tablet Take 1 tablet (12.5 mg  total) by mouth every 6 (six) hours as needed for up to 30 doses for nausea or vomiting. 12/15/18   Charlann Lange, PA-C  promethazine (PHENERGAN) 25 MG suppository Place 1 suppository (25 mg total) rectally every 6 (six) hours as needed for nausea or vomiting. 12/15/18   Charlann Lange, PA-C    Family History Family History  Problem Relation Age of Onset   Asthma Maternal Grandmother    Hypertension Maternal Grandmother    Diabetes Maternal Grandmother    Hypertension Paternal Grandfather    Diabetes Paternal Grandfather     Social History Social History   Tobacco Use   Smoking status: Former Smoker   Smokeless tobacco: Never Used  Substance Use Topics   Alcohol use: No   Drug use: No     Allergies   Penicillins   Review of Systems Review of Systems  Constitutional: Negative for chills and fever.  HENT: Negative for congestion.   Respiratory: Negative for chest tightness and shortness of breath.   Cardiovascular: Negative for chest pain.  Gastrointestinal: Positive for abdominal pain, nausea and vomiting. Negative for constipation and diarrhea.  Genitourinary: Negative for pelvic pain, vaginal bleeding and vaginal pain.  Musculoskeletal: Negative for back pain and neck pain.  Skin: Negative for color change, rash and wound.  Neurological: Negative for dizziness, weakness and headaches.  Psychiatric/Behavioral: Negative for confusion.  All other systems reviewed and are negative.    Physical Exam Updated Vital Signs BP 132/87    Pulse 77    Temp 98.9 F (37.2 C) (Oral)    Resp 20    SpO2 100%   Physical Exam Vitals signs and nursing note reviewed. Chaperone present: Agricultural engineer, Therapist, sports.  Constitutional:      Appearance: She is well-developed. She is not diaphoretic.     Comments: Frequently dry heaving while I am in the room.  HENT:     Head: Normocephalic and atraumatic.     Mouth/Throat:     Mouth: Mucous membranes are moist.  Eyes:     General: No scleral  icterus.       Right eye: No discharge.        Left eye: No discharge.     Conjunctiva/sclera: Conjunctivae normal.  Neck:     Musculoskeletal: Normal range of motion.  Cardiovascular:     Rate and Rhythm: Normal rate and regular rhythm.     Heart sounds: Normal heart sounds.  Pulmonary:     Effort: Pulmonary effort is normal. No respiratory distress.     Breath sounds: No stridor.  Abdominal:     General: Abdomen is flat. There is no distension.     Palpations: Abdomen is soft.     Tenderness: There is abdominal tenderness in  the right lower quadrant and suprapubic area.  Genitourinary:    Vagina: No signs of injury. No vaginal discharge or bleeding.     Cervix: No cervical motion tenderness or discharge.     Uterus: Normal. Not tender.      Adnexa: Right adnexa normal and left adnexa normal.       Right: No mass, tenderness or fullness.         Left: No mass, tenderness or fullness.    Musculoskeletal:        General: No deformity.  Skin:    General: Skin is warm and dry.  Neurological:     Mental Status: She is alert.     Motor: No abnormal muscle tone.  Psychiatric:        Behavior: Behavior normal.      ED Treatments / Results  Labs (all labs ordered are listed, but only abnormal results are displayed) Labs Reviewed  WET PREP, GENITAL - Abnormal; Notable for the following components:      Result Value   WBC, Wet Prep HPF POC MANY (*)    All other components within normal limits  LIPASE, BLOOD - Abnormal; Notable for the following components:   Lipase 61 (*)    All other components within normal limits  COMPREHENSIVE METABOLIC PANEL - Abnormal; Notable for the following components:   Potassium 3.3 (*)    Chloride 97 (*)    Glucose, Bld 103 (*)    Total Protein 9.0 (*)    Albumin 5.3 (*)    Anion gap 16 (*)    All other components within normal limits  CBC - Abnormal; Notable for the following components:   WBC 13.9 (*)    All other components within normal  limits  URINALYSIS, ROUTINE W REFLEX MICROSCOPIC - Abnormal; Notable for the following components:   APPearance HAZY (*)    Specific Gravity, Urine >1.030 (*)    Hgb urine dipstick MODERATE (*)    Bilirubin Urine MODERATE (*)    Ketones, ur >80 (*)    Protein, ur 100 (*)    All other components within normal limits  URINALYSIS, MICROSCOPIC (REFLEX) - Abnormal; Notable for the following components:   Bacteria, UA FEW (*)    All other components within normal limits  HCG, QUANTITATIVE, PREGNANCY  GC/CHLAMYDIA PROBE AMP ([place]) NOT AT [facility]    EKG None  Radiology-CT scan was obtained at yesterday's visit. Ct Abdomen Pelvis W Contrast  Result Date: 02/21/2019 CLINICAL DATA:  Abdominal pain, nausea, vomiting EXAM: CT ABDOMEN AND PELVIS WITH CONTRAST TECHNIQUE: Multidetector CT imaging of the abdomen and pelvis was performed using the standard protocol following bolus administration of intravenous contrast. CONTRAST:  34mL OMNIPAQUE IOHEXOL 300 MG/ML  SOLN COMPARISON:  11/08/2018 FINDINGS: Lower chest: No acute abnormality. Hepatobiliary: No focal liver abnormality is seen. No gallstones, gallbladder wall thickening, or biliary dilatation. Pancreas: Unremarkable. No pancreatic ductal dilatation or surrounding inflammatory changes. Spleen: Normal in size without focal abnormality. Adrenals/Urinary Tract: Unremarkable adrenal glands. Small stone within the left kidney is unchanged. There is no hydronephrosis. Kidneys enhance symmetrically. No ureteral stone. Urinary bladder appears unremarkable. Stomach/Bowel: Grossly unremarkable. Evaluation of individual bowel loops is degraded by paucity of intra-abdominal fat. There is no dilated loops of bowel. No evidence of focal thickening or inflammatory change. An appendix is not clearly visualized. Vascular/Lymphatic: No significant vascular findings are present. No enlarged abdominal or pelvic lymph nodes. Reproductive: IUD present within the  endometrial canal, new from  prior. Adnexal regions are within normal limits. Other: Trace free fluid within the pelvis, decreased from prior, and is likely physiologic. No abdominal wall hernia. Musculoskeletal: No acute or significant osseous findings. IMPRESSION: 1. No acute abdominopelvic findings. 2. Non-obstructing left renal calculus. 3. Trace free fluid in the pelvis, likely physiologic. Electronically Signed   By: Davina Poke M.D.   On: 02/21/2019 16:16    Procedures Procedures (including critical care time)  Medications Ordered in ED Medications  sodium chloride flush (NS) 0.9 % injection 3 mL (3 mLs Intravenous Not Given 02/22/19 2004)  metoCLOPramide (REGLAN) injection 10 mg (has no administration in time range)  diphenhydrAMINE (BENADRYL) injection 25 mg (has no administration in time range)  pantoprazole (PROTONIX) injection 40 mg (has no administration in time range)  ondansetron (ZOFRAN) injection 4 mg (4 mg Intravenous Given 02/22/19 2135)  haloperidol lactate (HALDOL) injection 5 mg (5 mg Intravenous Given 02/22/19 2201)  potassium chloride SA (KLOR-CON) CR tablet 40 mEq (40 mEq Oral Given 02/23/19 0030)  sodium chloride 0.9 % bolus 1,000 mL (1,000 mLs Intravenous New Bag/Given 02/23/19 0030)     Initial Impression / Assessment and Plan / ED Course  I have reviewed the triage vital signs and the nursing notes.  Pertinent labs & imaging results that were available during my care of the patient were reviewed by me and considered in my medical decision making (see chart for details).  Clinical Course as of Feb 22 50  Tue Feb 23, 2019  0039 Patient reevaluated, her abdominal pain is fully resolved after Haldol.  Attempted p.o. challenge however she is again dry heaving.  Ordered IV Protonix, Reglan, and Benadryl.   [EH]    Clinical Course User Index [EH] Lorin Glass, PA-C      Patient presents today for repeat evaluation of abdominal pain.  She has a  longstanding history of cyclic vomiting.  Pain yesterday was primarily in the right lower quadrant.  CT scan yesterday was unable to visualize her appendix.  She denies any dysuria, increased frequency or urgency.    Labs were obtained and reviewed, and compared with yesterday that her white count has decreased from 17.1 down to 13.9.  Her anion gap is slightly elevated, however is better than yesterday.  Chart review shows that usually when she is here she has a slightly elevated anion gap.  Potassium is slightly low at 3.3.  This was orally repleted.  Since yesterday her lipase is elevated from 23 up to 61.  She does not have upper abdominal pain.  On exam she has mild right lower quadrant abdominal pain.  Pelvic exam was performed which was unremarkable. After chart review her abdominal pain was treated with Haldol which fully resolved her pain.  After this she attempted p.o. challenge however again became nauseous and dry heaving.  Ordered additional liter of fluids, IV Protonix, Reglan and Benadryl.  .At shift change care was transferred to Dr. Dayna Barker who will follow pending studies, re-evaulate and determine disposition.      Final Clinical Impressions(s) / ED Diagnoses   Final diagnoses:  Nausea and vomiting, intractability of vomiting not specified, unspecified vomiting type    ED Discharge Orders    None       Lorin Glass, [place] 02/23/19 [id number]    Tegeler, Gwenyth Allegra, MD 02/23/19 1343

## 2019-02-22 NOTE — ED Notes (Signed)
Explained to pt. We will help her with her nausea asap

## 2019-02-22 NOTE — ED Triage Notes (Signed)
Abdominal pain and vomiting. She was told to come back today to r/o appendicitis.

## 2019-02-23 ENCOUNTER — Emergency Department (HOSPITAL_BASED_OUTPATIENT_CLINIC_OR_DEPARTMENT_OTHER): Payer: BC Managed Care – PPO

## 2019-02-23 ENCOUNTER — Telehealth: Payer: Self-pay | Admitting: *Deleted

## 2019-02-23 ENCOUNTER — Emergency Department (HOSPITAL_BASED_OUTPATIENT_CLINIC_OR_DEPARTMENT_OTHER)
Admission: EM | Admit: 2019-02-23 | Discharge: 2019-02-23 | Disposition: A | Discharge status: Home / Self Care | Payer: BC Managed Care – PPO | Source: Home / Self Care | Attending: Emergency Medicine | Admitting: Emergency Medicine

## 2019-02-23 ENCOUNTER — Other Ambulatory Visit: Payer: Self-pay

## 2019-02-23 ENCOUNTER — Encounter (HOSPITAL_BASED_OUTPATIENT_CLINIC_OR_DEPARTMENT_OTHER): Payer: Self-pay | Admitting: *Deleted

## 2019-02-23 DIAGNOSIS — Z87891 Personal history of nicotine dependence: Secondary | ICD-10-CM | POA: Insufficient documentation

## 2019-02-23 DIAGNOSIS — R1031 Right lower quadrant pain: Secondary | ICD-10-CM | POA: Insufficient documentation

## 2019-02-23 DIAGNOSIS — R1110 Vomiting, unspecified: Secondary | ICD-10-CM | POA: Insufficient documentation

## 2019-02-23 MED ORDER — IOHEXOL 300 MG/ML  SOLN
100.0000 mL | Freq: Once | INTRAMUSCULAR | Status: AC | PRN
  Administered 2019-02-23: 100 mL via INTRAVENOUS

## 2019-02-23 MED ORDER — PROMETHAZINE HCL 25 MG PO TABS: 25.0000 mg | ORAL_TABLET | Freq: Four times a day (QID) | ORAL | 0 refills | Status: DC | PRN

## 2019-02-23 MED ORDER — KETOROLAC TROMETHAMINE 15 MG/ML IJ SOLN
15.0000 mg | Freq: Once | INTRAMUSCULAR | Status: AC
  Administered 2019-02-23: 15 mg via INTRAVENOUS
  Filled 2019-02-23: qty 1

## 2019-02-23 MED ORDER — PANTOPRAZOLE SODIUM 40 MG IV SOLR
40.0000 mg | Freq: Once | INTRAVENOUS | Status: AC
  Administered 2019-02-23: 40 mg via INTRAVENOUS
  Filled 2019-02-23: qty 40

## 2019-02-23 MED ORDER — METOCLOPRAMIDE HCL 5 MG/ML IJ SOLN
10.0000 mg | Freq: Once | INTRAMUSCULAR | Status: AC
  Administered 2019-02-23: 01:00:00 10 mg via INTRAVENOUS
  Filled 2019-02-23: qty 2

## 2019-02-23 MED ORDER — ONDANSETRON HCL 4 MG/2ML IJ SOLN
4.0000 mg | Freq: Once | INTRAMUSCULAR | Status: AC
  Administered 2019-02-23: 4 mg via INTRAVENOUS
  Filled 2019-02-23: qty 2

## 2019-02-23 MED ORDER — DIPHENHYDRAMINE HCL 50 MG/ML IJ SOLN
25.0000 mg | Freq: Once | INTRAMUSCULAR | Status: AC
  Administered 2019-02-23: 01:00:00 25 mg via INTRAVENOUS
  Filled 2019-02-23: qty 1

## 2019-02-23 NOTE — ED Triage Notes (Signed)
Abdominal pain and vomiting.

## 2019-02-23 NOTE — ED Notes (Signed)
Patient transported to CT 

## 2019-02-23 NOTE — Discharge Instructions (Addendum)
You were evaluated in the Emergency Department and after careful evaluation, we did not find any emergent condition requiring admission or further testing in the hospital.  Your exam/testing today was overall reassuring.  We recommend follow-up with your GI specialist for further testing or discussion of new medications to control your symptoms.  Until then you can use the medication provided as needed.  Please return to the Emergency Department if you experience any worsening of your condition.  We encourage you to follow up with a primary care provider.  Thank you for allowing [place] to be a part of your care.

## 2019-02-23 NOTE — Telephone Encounter (Signed)
TOC CM consult 9 ED visits/2 IP admissions. Will need follow up with GI specialist. Attempted call to pt and unable to leave message, mailbox full. [place], [place] ED TOC CM [phone]

## 2019-02-23 NOTE — ED Provider Notes (Signed)
[facility] Emergency Department Provider Note MRN:  [id number]  Arrival date & time: 02/23/19     Chief Complaint   Abdominal Pain and Emesis   History of Present Illness   Debbie Hughes is a 28 y.o. year-old female with no pertinent past medical history presenting to the ED with chief complaint of abdominal pain.  3 days of persistent right lower quadrant abdominal pain.  Seen in the emergency department 2 times, seen her primary care doctor again today.  Had a CT scan performed in the ED that was unable to fully visualize the appendix.  Primary care doctor today concerned about her continued tenderness and vomiting, sent here for repeat evaluation.  Patient denies fever, no chest pain or shortness of breath, no upper abdominal pain, no vaginal bleeding or discharge, no dysuria or hematuria.  Review of Systems  A complete 10 system review of systems was obtained and all systems are negative except as noted in the HPI and PMH.   Patient's Health History    Past Medical History:  Diagnosis Date  . Breast mass in female 01/21/06  . Breast nodule 04/26/10   Left  . BV (bacterial vaginosis) 5.10/11  . Candida vaginitis 04/26/10  . Ectopic pregnancy 03/24/2013  . Fibroadenoma   . H/O varicella   . History of chlamydia   . Irregular menstrual cycle 07/28/07  . 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 Vaginal discharge 10/01/07  . Vaginitis     Past Surgical History:  Procedure Laterality Date  . BREAST CYST EXCISION Left 2007  . DILATION AND CURETTAGE OF UTERUS    . LAPAROSCOPY N/A 03/24/2013   Procedure: LAPAROSCOPY OPERATIVE;  Surgeon: Betsy Coder, MD;  Location: [facility];  Service: Gynecology;  Laterality: N/A;  . Left Salpingectomy  03/24/2013   Ectopic Pregnancy  . UNILATERAL SALPINGECTOMY Left 03/24/2013   Procedure: UNILATERAL SALPINGECTOMY;  Surgeon: Betsy Coder, MD;  Location: [facility];  Service:  Gynecology;  Laterality: Left;    Family History  Problem Relation Age of Onset  . Asthma Maternal Grandmother   . Hypertension Maternal Grandmother   . Diabetes Maternal Grandmother   . Hypertension Paternal Grandfather   . Diabetes Paternal Grandfather     Social History   Socioeconomic History  . Marital status: Single    Spouse name: Not on file  . Number of children: Not on file  . Years of education: Not on file  . Highest education level: Not on file  Occupational History  . Not on file  Social Needs  . Financial resource strain: Not on file  . Food insecurity    Worry: Never true    Inability: Never true  . Transportation needs    Medical: No    Non-medical: No  Tobacco Use  . Smoking status: Former Research scientist (life sciences)  . Smokeless tobacco: Never Used  Substance and Sexual Activity  . Alcohol use: No  . Drug use: No  . Sexual activity: Yes  Lifestyle  . Physical activity    Days per week: 0 days    Minutes per session: 0 min  . Stress: Not at all  Relationships  . Social Herbalist on phone: Three times a week    Gets together: Three times a week    Attends religious service: Never    Active member of club or organization: No    Attends meetings of clubs or organizations:  Never    Relationship status: Not on file  . Intimate partner violence    Fear of current or ex partner: Not on file    Emotionally abused: Not on file    Physically abused: Not on file    Forced sexual activity: Not on file  Other Topics Concern  . Not on file  Social History Narrative  . Not on file     Physical Exam  Vital Signs and Nursing Notes reviewed Vitals:   02/23/19 1150  BP: (!) 139/96  Pulse: 66  Resp: 14  Temp: 98.7 F (37.1 C)  SpO2: 100%    CONSTITUTIONAL: Well-appearing, NAD NEURO:  Alert and oriented x 3, no focal deficits EYES:  eyes equal and reactive ENT/NECK:  no LAD, no JVD CARDIO: Regular rate, well-perfused, normal S1 and S2 PULM:  CTAB no  wheezing or rhonchi GI/GU:  normal bowel sounds, non-distended, moderate right lower quadrant tenderness to palpation MSK/SPINE:  No gross deformities, no edema SKIN:  no rash, atraumatic PSYCH:  Appropriate speech and behavior  Diagnostic and Interventional Summary    Labs Reviewed - No data to display  CT ABDOMEN PELVIS W CONTRAST  Final Result      Medications  ondansetron (ZOFRAN) injection 4 mg (4 mg Intravenous Given 02/23/19 1218)  ketorolac (TORADOL) 15 MG/ML injection 15 mg (15 mg Intravenous Given 02/23/19 1220)  iohexol (OMNIPAQUE) 300 MG/ML solution 100 mL (100 mLs Intravenous Contrast Given 02/23/19 1222)     Procedures Critical Care  ED Course and Medical Decision Making  I have reviewed the triage vital signs and the nursing notes.  Pertinent labs & imaging results that were available during my care of the patient were reviewed by me and considered in my medical decision making (see below for details).  Patient sent here for repeat CT imaging given the PCP concern for appendicitis.  According to chart review patient does have a long history of cyclical vomiting and so this is also a possibility.  Patient made aware of the risks associated with repeat CT and radiation exposure.  CT is pending.  CT is without acute process.  Upon further questioning, patient does explain that her symptoms feel similar to prior cyclical vomiting spells.  She is appropriate for GI follow-up.  Barth Kirks. [place]onia Small, [place] [email or web address]  Final Clinical Impressions(s) / ED Diagnoses     ICD-10-CM   1. Right lower quadrant abdominal pain  R10.31     ED Discharge Orders         Ordered    promethazine (PHENERGAN) 25 MG tablet  Every 6 hours PRN     02/23/19 1402          Discharge Instructions Discussed with and Provided to Patient:   Discharge Instructions     You were evaluated in the Emergency Department and after  careful evaluation, we did not find any emergent condition requiring admission or further testing in the hospital.  Your exam/testing today was overall reassuring.  We recommend follow-up with your GI specialist for further testing or discussion of new medications to control your symptoms.  Until then you can use the medication provided as needed.  Please return to the Emergency Department if you experience any worsening of your condition.  We encourage you to follow up with a primary care provider.  Thank you for allowing [place] to be a part of your care.        ,  Barth Kirks, MD 02/23/19 [phone]

## 2019-02-23 NOTE — ED Notes (Signed)
Nausea continues with Pt.

## 2019-02-23 NOTE — ED Notes (Signed)
ED Provider at bedside. 

## 2019-02-23 NOTE — ED Provider Notes (Signed)
12:54 AM Assumed care from Dr. Sherry Ruffing and PA Wyn Quaker, please see their note for full history, physical and decision making until this point. In brief this is a 28 y.o. year old female who presented to the ED tonight with Abdominal Pain and Emesis     Vomiting and abdominal pain.  Seen here multiple times previously for the same.  As recently as yesterday which had a negative CT scan.  Pending improving symptoms.  Patient here for multiple hours without any episodes of abdominal pain or vomiting.  Has been tolerating small amounts of food and fluids.   Discharge instructions, including strict return precautions for new or worsening symptoms, given. Patient and/or family verbalized understanding and agreement with the plan as described.   Labs, studies and imaging reviewed by myself and considered in medical decision making if ordered. Imaging interpreted by radiology.  Labs Reviewed  WET PREP, GENITAL - Abnormal; Notable for the following components:      Result Value   WBC, Wet Prep HPF POC MANY (*)    All other components within normal limits  LIPASE, BLOOD - Abnormal; Notable for the following components:   Lipase 61 (*)    All other components within normal limits  COMPREHENSIVE METABOLIC PANEL - Abnormal; Notable for the following components:   Potassium 3.3 (*)    Chloride 97 (*)    Glucose, Bld 103 (*)    Total Protein 9.0 (*)    Albumin 5.3 (*)    Anion gap 16 (*)    All other components within normal limits  CBC - Abnormal; Notable for the following components:   WBC 13.9 (*)    All other components within normal limits  URINALYSIS, ROUTINE W REFLEX MICROSCOPIC - Abnormal; Notable for the following components:   APPearance HAZY (*)    Specific Gravity, Urine >1.030 (*)    Hgb urine dipstick MODERATE (*)    Bilirubin Urine MODERATE (*)    Ketones, ur >80 (*)    Protein, ur 100 (*)    All other components within normal limits  URINALYSIS, MICROSCOPIC (REFLEX) -  Abnormal; Notable for the following components:   Bacteria, UA FEW (*)    All other components within normal limits  HCG, QUANTITATIVE, PREGNANCY  GC/CHLAMYDIA PROBE AMP ([place]) NOT AT [facility]    No orders to display    No follow-ups on file.    Matilde Pottenger, Corene Cornea, MD 02/23/19 [phone]

## 2019-02-24 LAB — GC/CHLAMYDIA PROBE AMP (~~LOC~~) NOT AT ARMC
Chlamydia: NEGATIVE
Neisseria Gonorrhea: NEGATIVE

## 2019-04-30 ENCOUNTER — Other Ambulatory Visit: Payer: Self-pay

## 2019-04-30 ENCOUNTER — Emergency Department (HOSPITAL_BASED_OUTPATIENT_CLINIC_OR_DEPARTMENT_OTHER)
Admission: EM | Admit: 2019-04-30 | Discharge: 2019-04-30 | Disposition: A | Discharge status: Home / Self Care | Payer: BC Managed Care – PPO | Attending: Emergency Medicine | Admitting: Emergency Medicine

## 2019-04-30 ENCOUNTER — Encounter (HOSPITAL_BASED_OUTPATIENT_CLINIC_OR_DEPARTMENT_OTHER): Payer: Self-pay | Admitting: Emergency Medicine

## 2019-04-30 DIAGNOSIS — R1115 Cyclical vomiting syndrome unrelated to migraine: Secondary | ICD-10-CM | POA: Insufficient documentation

## 2019-04-30 DIAGNOSIS — R1084 Generalized abdominal pain: Secondary | ICD-10-CM | POA: Diagnosis not present

## 2019-04-30 DIAGNOSIS — Z79899 Other long term (current) drug therapy: Secondary | ICD-10-CM | POA: Diagnosis not present

## 2019-04-30 DIAGNOSIS — Z87891 Personal history of nicotine dependence: Secondary | ICD-10-CM | POA: Diagnosis not present

## 2019-04-30 DIAGNOSIS — R112 Nausea with vomiting, unspecified: Secondary | ICD-10-CM | POA: Diagnosis present

## 2019-04-30 LAB — CBC WITH DIFFERENTIAL/PLATELET
Abs Immature Granulocytes: 0.04 10*3/uL (ref 0.00–0.07)
Basophils Absolute: 0 10*3/uL (ref 0.0–0.1)
Basophils Relative: 0 %
Eosinophils Absolute: 0 10*3/uL (ref 0.0–0.5)
Eosinophils Relative: 0 %
HCT: 41.2 % (ref 36.0–46.0)
Hemoglobin: 13.9 g/dL (ref 12.0–15.0)
Immature Granulocytes: 0 %
Lymphocytes Relative: 13 %
Lymphs Abs: 1.6 10*3/uL (ref 0.7–4.0)
MCH: 32.8 pg (ref 26.0–34.0)
MCHC: 33.7 g/dL (ref 30.0–36.0)
MCV: 97.2 fL (ref 80.0–100.0)
Monocytes Absolute: 0.3 10*3/uL (ref 0.1–1.0)
Monocytes Relative: 2 %
Neutro Abs: 10.5 10*3/uL — ABNORMAL HIGH (ref 1.7–7.7)
Neutrophils Relative %: 85 %
Platelets: 271 10*3/uL (ref 150–400)
RBC: 4.24 MIL/uL (ref 3.87–5.11)
RDW: 11.3 % — ABNORMAL LOW (ref 11.5–15.5)
WBC: 12.4 10*3/uL — ABNORMAL HIGH (ref 4.0–10.5)
nRBC: 0 % (ref 0.0–0.2)

## 2019-04-30 LAB — URINALYSIS, ROUTINE W REFLEX MICROSCOPIC
Bilirubin Urine: NEGATIVE
Glucose, UA: NEGATIVE mg/dL
Ketones, ur: 15 mg/dL — AB
Leukocytes,Ua: NEGATIVE
Nitrite: NEGATIVE
Protein, ur: NEGATIVE mg/dL
Specific Gravity, Urine: 1.025 (ref 1.005–1.030)
pH: 8.5 — ABNORMAL HIGH (ref 5.0–8.0)

## 2019-04-30 LAB — COMPREHENSIVE METABOLIC PANEL
ALT: 20 U/L (ref 0–44)
AST: 27 U/L (ref 15–41)
Albumin: 4.6 g/dL (ref 3.5–5.0)
Alkaline Phosphatase: 61 U/L (ref 38–126)
Anion gap: 12 (ref 5–15)
BUN: 12 mg/dL (ref 6–20)
CO2: 22 mmol/L (ref 22–32)
Calcium: 9.3 mg/dL (ref 8.9–10.3)
Chloride: 104 mmol/L (ref 98–111)
Creatinine, Ser: 0.73 mg/dL (ref 0.44–1.00)
GFR calc Af Amer: >60 mL/min (ref >60)
GFR calc non Af Amer: >60 mL/min (ref >60)
Glucose, Bld: 164 mg/dL — ABNORMAL HIGH (ref 70–99)
Potassium: 3.2 mmol/L — ABNORMAL LOW (ref 3.5–5.1)
Sodium: 138 mmol/L (ref 135–145)
Total Bilirubin: 0.6 mg/dL (ref 0.3–1.2)
Total Protein: 7.8 g/dL (ref 6.5–8.1)

## 2019-04-30 LAB — URINALYSIS, MICROSCOPIC (REFLEX)

## 2019-04-30 LAB — RAPID URINE DRUG SCREEN, HOSP PERFORMED
Amphetamines: NOT DETECTED
Barbiturates: NOT DETECTED
Benzodiazepines: NOT DETECTED
Cocaine: NOT DETECTED
Opiates: NOT DETECTED
Tetrahydrocannabinol: POSITIVE — AB

## 2019-04-30 LAB — LIPASE, BLOOD: Lipase: 29 U/L (ref 11–51)

## 2019-04-30 LAB — PREGNANCY, URINE: Preg Test, Ur: NEGATIVE

## 2019-04-30 MED ORDER — METOCLOPRAMIDE HCL 5 MG/ML IJ SOLN
10.0000 mg | Freq: Once | INTRAMUSCULAR | Status: AC
  Administered 2019-04-30: 10 mg via INTRAVENOUS
  Filled 2019-04-30: qty 2

## 2019-04-30 MED ORDER — ONDANSETRON HCL 4 MG/2ML IJ SOLN
4.0000 mg | Freq: Once | INTRAMUSCULAR | Status: AC
  Administered 2019-04-30: 4 mg via INTRAVENOUS
  Filled 2019-04-30: qty 2

## 2019-04-30 MED ORDER — SODIUM CHLORIDE 0.9 % IV SOLN: INTRAVENOUS | Status: DC

## 2019-04-30 MED ORDER — SODIUM CHLORIDE 0.9 % IV BOLUS
1000.0000 mL | Freq: Once | INTRAVENOUS | Status: AC
  Administered 2019-04-30: 1000 mL via INTRAVENOUS

## 2019-04-30 MED ORDER — POTASSIUM CHLORIDE 10 MEQ/100ML IV SOLN
10.0000 meq | Freq: Once | INTRAVENOUS | Status: AC
  Administered 2019-04-30: 10 meq via INTRAVENOUS
  Filled 2019-04-30: qty 100

## 2019-04-30 MED ORDER — HALOPERIDOL LACTATE 5 MG/ML IJ SOLN
5.0000 mg | Freq: Once | INTRAMUSCULAR | Status: AC
  Administered 2019-04-30: 5 mg via INTRAVENOUS
  Filled 2019-04-30: qty 1

## 2019-04-30 MED ORDER — DIPHENHYDRAMINE HCL 50 MG/ML IJ SOLN
25.0000 mg | Freq: Once | INTRAMUSCULAR | Status: AC
  Administered 2019-04-30: 25 mg via INTRAVENOUS
  Filled 2019-04-30: qty 1

## 2019-04-30 MED ORDER — PROMETHAZINE HCL 25 MG PO TABS: 25.0000 mg | ORAL_TABLET | Freq: Four times a day (QID) | ORAL | 1 refills | Status: DC | PRN

## 2019-04-30 NOTE — Discharge Instructions (Signed)
Vomiting now under control.  Potassium was slightly low.  But he got potassium supplement here.  Vomiting may be secondary to marijuana use.  Would recommend that he stop marijuana use.  Take the Phenergan as needed to help control the vomiting.

## 2019-04-30 NOTE — ED Notes (Signed)
Patient is resting comfortably. 

## 2019-04-30 NOTE — ED Provider Notes (Signed)
[facility] EMERGENCY DEPARTMENT Provider Note   CSN: [id number] Arrival date & time: 04/30/19  1037     History Chief Complaint  Patient presents with  . Emesis    Debbie Hughes is a 28 y.o. female.  Patient with a history of cyclical vomiting or intractable vomiting.  Patient presents with acute onset of vomiting multiple times since 2 this morning.  Associated with generalized abdominal pain.  Patient stated she felt fine earlier in the day yesterday.  No vomiting of blood.  No diarrhea.        Past Medical History:  Diagnosis Date  . Breast mass in female 01/21/06  . Breast nodule 04/26/10   Left  . BV (bacterial vaginosis) 5.10/11  . Candida vaginitis 04/26/10  . Ectopic pregnancy 03/24/2013  . Fibroadenoma   . H/O varicella   . History of chlamydia   . Irregular menstrual cycle 07/28/07  . 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 Vaginal discharge 10/01/07  . Vaginitis     Patient Active Problem List   Diagnosis Date Noted  . Intractable nausea and vomiting 11/08/2018  . Hypokalemia 11/08/2018  . Leukocytosis 11/08/2018  . Hyponatremia 11/08/2018  . Normal postpartum course 08/27/2018  . Indication for care in labor or delivery 08/26/2018  . Nausea and vomiting during pregnancy 08/16/2018  . Abdominal pain during pregnancy in third trimester 08/16/2018  . Emesis 08/16/2018  . Preterm contractions 08/16/2018  . Preterm labor 07/25/2017  . NSVD (normal spontaneous vaginal delivery) 07/25/2017  . Pregnant 12/17/2016  . Breast cyst, left (excised in 2007) 11/23/2016  . Hyperemesis gravidarum 11/22/2016  . Acute generalized abdominal pain 11/22/2016  . Allergy to penicillin 09/22/2013  . Hx of ectopic pregnancy---s/p left salpingectomy 03/2013 09/22/2013    Past Surgical History:  Procedure Laterality Date  . BREAST CYST EXCISION Left 2007  . DILATION AND CURETTAGE OF UTERUS    . LAPAROSCOPY N/A 03/24/2013     Procedure: LAPAROSCOPY OPERATIVE;  Surgeon: Betsy Coder, MD;  Location: [facility];  Service: Gynecology;  Laterality: N/A;  . Left Salpingectomy  03/24/2013   Ectopic Pregnancy  . UNILATERAL SALPINGECTOMY Left 03/24/2013   Procedure: UNILATERAL SALPINGECTOMY;  Surgeon: Betsy Coder, MD;  Location: [facility];  Service: Gynecology;  Laterality: Left;     OB History    Gravida  5   Para  3   Term  2   Preterm  1   AB  2   Living  3     SAB  1   TAB      Ectopic  1   Multiple  0   Live Births  3           Family History  Problem Relation Age of Onset  . Asthma Maternal Grandmother   . Hypertension Maternal Grandmother   . Diabetes Maternal Grandmother   . Hypertension Paternal Grandfather   . Diabetes Paternal Grandfather     Social History   Tobacco Use  . Smoking status: Former Research scientist (life sciences)  . Smokeless tobacco: Never Used  Substance Use Topics  . Alcohol use: Yes    Comment: occasional   . Drug use: Not Currently    Types: Marijuana    Home Medications Prior to Admission medications   Medication Sig Start Date End Date Taking? Authorizing Provider  Acetaminophen-Codeine 300-30 MG tablet Take 1 tablet by mouth every 6 (six) hours as needed. 04/16/19   [provider]  cyclobenzaprine (FLEXERIL) 5 MG tablet Take 5-10 mg by mouth at bedtime as needed. 04/27/19   [provider]  methocarbamol (ROBAXIN) 500 MG tablet Take 500 mg by mouth every 6 (six) hours as needed. 04/27/19   [provider]  metoCLOPramide (REGLAN) 10 MG tablet Take 1 tablet (10 mg total) by mouth every 6 (six) hours as needed for nausea. 12/16/18   Ward, Delice Bison, DO  MOBIC 15 MG tablet Take 15 mg by mouth daily as needed. 04/27/19   [provider]  ondansetron (ZOFRAN ODT) 4 MG disintegrating tablet Take 1 tablet (4 mg total) by mouth every 6 (six) hours as needed for nausea or vomiting. 12/16/18   Ward, Delice Bison, DO  pantoprazole (PROTONIX) 20 MG  tablet Take 20 mg by mouth 2 (two) times daily. 03/03/19   [provider]  pantoprazole (PROTONIX) 40 MG tablet Take 1 tablet (40 mg total) by mouth daily. 11/09/18 12/09/18  Antonieta Pert, MD  promethazine (PHENERGAN) 12.5 MG tablet Take 1 tablet (12.5 mg total) by mouth every 6 (six) hours as needed for up to 30 doses for nausea or vomiting. 12/15/18   Charlann Lange, PA-C  promethazine (PHENERGAN) 25 MG suppository Place 1 suppository (25 mg total) rectally every 6 (six) hours as needed for nausea or vomiting. 12/15/18   Charlann Lange, PA-C  promethazine (PHENERGAN) 25 MG tablet Take 1 tablet (25 mg total) by mouth every 6 (six) hours as needed for nausea or vomiting. 02/23/19   Maudie Flakes, MD  promethazine (PHENERGAN) 25 MG tablet Take 1 tablet (25 mg total) by mouth every 6 (six) hours as needed for nausea or vomiting. 04/30/19   Fredia Sorrow, MD    Allergies    Penicillins  Review of Systems   Review of Systems  Constitutional: Negative for chills and fever.  HENT: Negative for congestion, rhinorrhea and sore throat.   Eyes: Negative for visual disturbance.  Respiratory: Negative for cough and shortness of breath.   Cardiovascular: Negative for chest pain and leg swelling.  Gastrointestinal: Positive for abdominal pain, nausea and vomiting. Negative for diarrhea.  Genitourinary: Negative for dysuria.  Musculoskeletal: Negative for back pain and neck pain.  Skin: Negative for rash.  Neurological: Negative for dizziness, light-headedness and headaches.  Hematological: Does not bruise/bleed easily.  Psychiatric/Behavioral: Negative for confusion.    Physical Exam Updated Vital Signs BP 112/73   Pulse 69 Comment: Simultaneous filing. User may not have seen previous data.  Temp 97.6 F (36.4 C) (Oral)   Resp 20   Ht 1.575 m (5\' 2" )   Wt 50.8 kg   SpO2 100%   BMI 20.49 kg/m   Physical Exam Vitals and nursing note reviewed.  Constitutional:      General: She is  not in acute distress.    Appearance: Normal appearance. She is well-developed.  HENT:     Head: Normocephalic and atraumatic.  Eyes:     Conjunctiva/sclera: Conjunctivae normal.     Pupils: Pupils are equal, round, and reactive to light.  Cardiovascular:     Rate and Rhythm: Normal rate and regular rhythm.     Heart sounds: No murmur.  Pulmonary:     Effort: Pulmonary effort is normal. No respiratory distress.     Breath sounds: Normal breath sounds.  Abdominal:     Palpations: Abdomen is soft.     Tenderness: There is no abdominal tenderness.  Musculoskeletal:        General: Normal  range of motion.     Cervical back: Normal range of motion and neck supple.  Skin:    General: Skin is warm and dry.  Neurological:     General: No focal deficit present.     Mental Status: She is alert and oriented to person, place, and time.     Cranial Nerves: No cranial nerve deficit.     Sensory: No sensory deficit.     ED Results / Procedures / Treatments   Labs (all labs ordered are listed, but only abnormal results are displayed) Labs Reviewed  URINALYSIS, ROUTINE W REFLEX MICROSCOPIC - Abnormal; Notable for the following components:      Result Value   pH 8.5 (*)    Hgb urine dipstick TRACE (*)    Ketones, ur 15 (*)    All other components within normal limits  RAPID URINE DRUG SCREEN, HOSP PERFORMED - Abnormal; Notable for the following components:   Tetrahydrocannabinol POSITIVE (*)    All other components within normal limits  COMPREHENSIVE METABOLIC PANEL - Abnormal; Notable for the following components:   Potassium 3.2 (*)    Glucose, Bld 164 (*)    All other components within normal limits  CBC WITH DIFFERENTIAL/PLATELET - Abnormal; Notable for the following components:   WBC 12.4 (*)    RDW 11.3 (*)    Neutro Abs 10.5 (*)    All other components within normal limits  URINALYSIS, MICROSCOPIC (REFLEX) - Abnormal; Notable for the following components:   Bacteria, UA RARE  (*)    All other components within normal limits  PREGNANCY, URINE  LIPASE, BLOOD    EKG None  Radiology No results found.  Procedures Procedures (including critical care time)  Medications Ordered in ED Medications  0.9 %  sodium chloride infusion (has no administration in time range)  potassium chloride 10 mEq in 100 mL IVPB (10 mEq Intravenous New Bag/Given 04/30/19 1158)  sodium chloride 0.9 % bolus 1,000 mL (1,000 mLs Intravenous New Bag/Given 04/30/19 1103)  ondansetron (ZOFRAN) injection 4 mg (4 mg Intravenous Given 04/30/19 1115)  diphenhydrAMINE (BENADRYL) injection 25 mg (25 mg Intravenous Given 04/30/19 1114)  metoCLOPramide (REGLAN) injection 10 mg (10 mg Intravenous Given 04/30/19 1116)  haloperidol lactate (HALDOL) injection 5 mg (5 mg Intravenous Given 04/30/19 1115)    ED Course  I have reviewed the triage vital signs and the nursing notes.  Pertinent labs & imaging results that were available during my care of the patient were reviewed by me and considered in my medical decision making (see chart for details).    MDM Rules/Calculators/A&P                       Patient responded well to IV fluids IV Reglan IV Haldol IV Zofran and Benadryl.  Vomiting is stopped.  Patient feeling better.  Electrolytes without any significant abnormalities other than potassium being slightly low at 3.2.  Patient given IV potassium supplementation.  Patient stable for discharge home.  Will renew her prescription for Phenergan.  Have her follow-up with her primary care doctor.  Patient will return for any new or worse symptoms.  Patient did have THC in her urine drug screen.  Patient was made aware that the marijuana use could be responsible for these cyclical vomiting episodes.    Final Clinical Impression(s) / ED Diagnoses Final diagnoses:  Cyclical vomiting    Rx / DC Orders ED Discharge Orders  Ordered    promethazine (PHENERGAN) 25 MG tablet  Every 6 hours PRN      04/30/19 1256           Fredia Sorrow, MD 04/30/19 1300

## 2019-04-30 NOTE — ED Triage Notes (Addendum)
N/V since 0500 this am, diarrhea as well. States, last used marijuana "few months ago" Retching loudly

## 2019-05-02 ENCOUNTER — Other Ambulatory Visit: Payer: Self-pay

## 2019-05-02 ENCOUNTER — Emergency Department (HOSPITAL_BASED_OUTPATIENT_CLINIC_OR_DEPARTMENT_OTHER)
Admission: EM | Admit: 2019-05-02 | Discharge: 2019-05-03 | Disposition: A | Discharge status: Home / Self Care | Payer: BC Managed Care – PPO | Source: Home / Self Care | Attending: Emergency Medicine | Admitting: Emergency Medicine

## 2019-05-02 ENCOUNTER — Encounter (HOSPITAL_BASED_OUTPATIENT_CLINIC_OR_DEPARTMENT_OTHER): Payer: Self-pay | Admitting: *Deleted

## 2019-05-02 ENCOUNTER — Emergency Department (HOSPITAL_BASED_OUTPATIENT_CLINIC_OR_DEPARTMENT_OTHER)
Admission: EM | Admit: 2019-05-02 | Discharge: 2019-05-02 | Disposition: A | Discharge status: Home / Self Care | Payer: BC Managed Care – PPO | Attending: Emergency Medicine | Admitting: Emergency Medicine

## 2019-05-02 DIAGNOSIS — Z87891 Personal history of nicotine dependence: Secondary | ICD-10-CM | POA: Insufficient documentation

## 2019-05-02 DIAGNOSIS — R1110 Vomiting, unspecified: Secondary | ICD-10-CM | POA: Diagnosis not present

## 2019-05-02 DIAGNOSIS — R1115 Cyclical vomiting syndrome unrelated to migraine: Secondary | ICD-10-CM

## 2019-05-02 DIAGNOSIS — Z79899 Other long term (current) drug therapy: Secondary | ICD-10-CM | POA: Insufficient documentation

## 2019-05-02 DIAGNOSIS — R112 Nausea with vomiting, unspecified: Secondary | ICD-10-CM | POA: Insufficient documentation

## 2019-05-02 DIAGNOSIS — R109 Unspecified abdominal pain: Secondary | ICD-10-CM | POA: Insufficient documentation

## 2019-05-02 HISTORY — DX: Cyclical vomiting syndrome unrelated to migraine: R11.15

## 2019-05-02 LAB — URINALYSIS, MICROSCOPIC (REFLEX)

## 2019-05-02 LAB — PREGNANCY, URINE: Preg Test, Ur: NEGATIVE

## 2019-05-02 LAB — CBC WITH DIFFERENTIAL/PLATELET
Abs Immature Granulocytes: 0.02 10*3/uL (ref 0.00–0.07)
Basophils Absolute: 0 10*3/uL (ref 0.0–0.1)
Basophils Relative: 0 %
Eosinophils Absolute: 0 10*3/uL (ref 0.0–0.5)
Eosinophils Relative: 0 %
HCT: 43.4 % (ref 36.0–46.0)
Hemoglobin: 14.9 g/dL (ref 12.0–15.0)
Immature Granulocytes: 0 %
Lymphocytes Relative: 8 %
Lymphs Abs: 1 10*3/uL (ref 0.7–4.0)
MCH: 32.8 pg (ref 26.0–34.0)
MCHC: 34.3 g/dL (ref 30.0–36.0)
MCV: 95.6 fL (ref 80.0–100.0)
Monocytes Absolute: 0.5 10*3/uL (ref 0.1–1.0)
Monocytes Relative: 4 %
Neutro Abs: 11 10*3/uL — ABNORMAL HIGH (ref 1.7–7.7)
Neutrophils Relative %: 88 %
Platelets: 263 10*3/uL (ref 150–400)
RBC: 4.54 MIL/uL (ref 3.87–5.11)
RDW: 11.7 % (ref 11.5–15.5)
WBC: 12.6 10*3/uL — ABNORMAL HIGH (ref 4.0–10.5)
nRBC: 0 % (ref 0.0–0.2)

## 2019-05-02 LAB — URINALYSIS, ROUTINE W REFLEX MICROSCOPIC
Glucose, UA: NEGATIVE mg/dL
Hgb urine dipstick: NEGATIVE
Ketones, ur: 40 mg/dL — AB
Leukocytes,Ua: NEGATIVE
Nitrite: NEGATIVE
Protein, ur: 100 mg/dL — AB
Specific Gravity, Urine: >1.03 — ABNORMAL HIGH (ref 1.005–1.030)
pH: 6.5 (ref 5.0–8.0)

## 2019-05-02 LAB — COMPREHENSIVE METABOLIC PANEL
ALT: 18 U/L (ref 0–44)
AST: 25 U/L (ref 15–41)
Albumin: 4.7 g/dL (ref 3.5–5.0)
Alkaline Phosphatase: 62 U/L (ref 38–126)
Anion gap: 13 (ref 5–15)
BUN: 20 mg/dL (ref 6–20)
CO2: 26 mmol/L (ref 22–32)
Calcium: 9.6 mg/dL (ref 8.9–10.3)
Chloride: 97 mmol/L — ABNORMAL LOW (ref 98–111)
Creatinine, Ser: 0.83 mg/dL (ref 0.44–1.00)
GFR calc Af Amer: >60 mL/min (ref >60)
GFR calc non Af Amer: >60 mL/min (ref >60)
Glucose, Bld: 116 mg/dL — ABNORMAL HIGH (ref 70–99)
Potassium: 3.8 mmol/L (ref 3.5–5.1)
Sodium: 136 mmol/L (ref 135–145)
Total Bilirubin: 1.9 mg/dL — ABNORMAL HIGH (ref 0.3–1.2)
Total Protein: 8.4 g/dL — ABNORMAL HIGH (ref 6.5–8.1)

## 2019-05-02 LAB — LIPASE, BLOOD: Lipase: 28 U/L (ref 11–51)

## 2019-05-02 MED ORDER — HALOPERIDOL LACTATE 5 MG/ML IJ SOLN
2.0000 mg | Freq: Once | INTRAMUSCULAR | Status: AC
  Administered 2019-05-02: 23:00:00 2 mg via INTRAVENOUS
  Filled 2019-05-02: qty 1

## 2019-05-02 MED ORDER — SODIUM CHLORIDE 0.9 % IV BOLUS (SEPSIS)
1000.0000 mL | Freq: Once | INTRAVENOUS | Status: AC
  Administered 2019-05-02: 1000 mL via INTRAVENOUS

## 2019-05-02 MED ORDER — METOCLOPRAMIDE HCL 10 MG PO TABS: 10.0000 mg | ORAL_TABLET | Freq: Four times a day (QID) | ORAL | 0 refills | Status: DC | PRN

## 2019-05-02 MED ORDER — HALOPERIDOL LACTATE 5 MG/ML IJ SOLN
5.0000 mg | Freq: Once | INTRAMUSCULAR | Status: AC
  Administered 2019-05-02: 5 mg via INTRAVENOUS
  Filled 2019-05-02: qty 1

## 2019-05-02 MED ORDER — PANTOPRAZOLE SODIUM 40 MG IV SOLR
40.0000 mg | Freq: Once | INTRAVENOUS | Status: AC
  Administered 2019-05-02: 40 mg via INTRAVENOUS
  Filled 2019-05-02: qty 40

## 2019-05-02 MED ORDER — SODIUM CHLORIDE 0.9 % IV BOLUS
1000.0000 mL | Freq: Once | INTRAVENOUS | Status: AC
  Administered 2019-05-02: 1000 mL via INTRAVENOUS

## 2019-05-02 NOTE — ED Triage Notes (Addendum)
Pt c/o n/v since Christmas day. Was seen in the ED for same symptoms. States she has been using phenergan suppositories without relief. Denies any fevers. C/o general abd cramping. Hx of cyclic vomiting.

## 2019-05-02 NOTE — ED Provider Notes (Signed)
TIME SEEN: 3:33 AM  CHIEF COMPLAINT: Intractable vomiting  HPI: Patient is a 28 year old female who presents to the emergency department with intractable vomiting.  Has history of cyclic vomiting.  States she used to smoke marijuana but quit over a month ago.  She is being followed by gastroenterology at [facility].  They have recommended a gastric emptying study to evaluate for gastroparesis.  She is not a known diabetic.  Has recurrent symptoms every 1 to 2 months.  Has been vomiting for the past 2 days without relief from oral or rectal Phenergan.  Was seen in the ED and required multiple antiemetics prior to discharge.  Is having diffuse abdominal cramping.  Denies fevers, bloody stools or melena, dysuria or hematuria, vaginal bleeding or discharge, diarrhea.  History of previous right ectopic pregnancy requiring surgical management.  No known sick contacts.  No Covid exposures.  Husband brought her to the emergency department.  ROS: See HPI Constitutional: no fever  Eyes: no drainage  ENT: no runny nose   Cardiovascular:  no chest pain  Resp: no SOB  GI:  vomiting GU: no dysuria Integumentary: no rash  Allergy: no hives  Musculoskeletal: no leg swelling  Neurological: no slurred speech ROS otherwise negative  PAST MEDICAL HISTORY/PAST SURGICAL HISTORY:  Past Medical History:  Diagnosis Date  . Breast mass in female 01/21/06  . Breast nodule 04/26/10   Left  . BV (bacterial vaginosis) 5.10/11  . Candida vaginitis 04/26/10  . Ectopic pregnancy 03/24/2013  . Fibroadenoma   . H/O varicella   . History of chlamydia   . Irregular menstrual cycle 07/28/07  . 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 Vaginal discharge 10/01/07  . Vaginitis     MEDICATIONS:  Prior to Admission medications   Medication Sig Start Date End Date Taking? Authorizing Provider  Acetaminophen-Codeine 300-30 MG tablet Take 1 tablet by mouth every 6 (six) hours as needed.  04/16/19   [provider]  cyclobenzaprine (FLEXERIL) 5 MG tablet Take 5-10 mg by mouth at bedtime as needed. 04/27/19   [provider]  methocarbamol (ROBAXIN) 500 MG tablet Take 500 mg by mouth every 6 (six) hours as needed. 04/27/19   [provider]  metoCLOPramide (REGLAN) 10 MG tablet Take 1 tablet (10 mg total) by mouth every 6 (six) hours as needed for nausea. 12/16/18   Jailey Booton, Delice Bison, DO  MOBIC 15 MG tablet Take 15 mg by mouth daily as needed. 04/27/19   [provider]  ondansetron (ZOFRAN ODT) 4 MG disintegrating tablet Take 1 tablet (4 mg total) by mouth every 6 (six) hours as needed for nausea or vomiting. 12/16/18   Margurete Guaman, Delice Bison, DO  pantoprazole (PROTONIX) 20 MG tablet Take 20 mg by mouth 2 (two) times daily. 03/03/19   [provider]  pantoprazole (PROTONIX) 40 MG tablet Take 1 tablet (40 mg total) by mouth daily. 11/09/18 12/09/18  Antonieta Pert, MD  promethazine (PHENERGAN) 12.5 MG tablet Take 1 tablet (12.5 mg total) by mouth every 6 (six) hours as needed for up to 30 doses for nausea or vomiting. 12/15/18   Charlann Lange, PA-C  promethazine (PHENERGAN) 25 MG suppository Place 1 suppository (25 mg total) rectally every 6 (six) hours as needed for nausea or vomiting. 12/15/18   Charlann Lange, PA-C  promethazine (PHENERGAN) 25 MG tablet Take 1 tablet (25 mg total) by mouth every 6 (six) hours as needed for nausea or vomiting. 02/23/19  Maudie Flakes, MD  promethazine (PHENERGAN) 25 MG tablet Take 1 tablet (25 mg total) by mouth every 6 (six) hours as needed for nausea or vomiting. 04/30/19   Fredia Sorrow, MD    ALLERGIES:  Allergies  Allergen Reactions  . Penicillins Rash    Has patient had a PCN reaction causing immediate rash, facial/tongue/throat swelling, SOB or lightheadedness with hypotension: yes Has patient had a PCN reaction causing severe rash involving mucus membranes or skin necrosis: no Has patient had a PCN reaction  that required hospitalization no Has patient had a PCN reaction occurring within the last 10 years: yes If all of the above answers are "NO", then may proceed with Cephalosporin use.     SOCIAL HISTORY:  Social History   Tobacco Use  . Smoking status: Former Research scientist (life sciences)  . Smokeless tobacco: Never Used  Substance Use Topics  . Alcohol use: Yes    Comment: occasional     FAMILY HISTORY: Family History  Problem Relation Age of Onset  . Asthma Maternal Grandmother   . Hypertension Maternal Grandmother   . Diabetes Maternal Grandmother   . Hypertension Paternal Grandfather   . Diabetes Paternal Grandfather     EXAM: BP (!) 127/104   Pulse 75   Temp 98 F (36.7 C)   Resp 16   Ht 5\' 3"  (1.6 m)   Wt 50.8 kg   SpO2 99%   BMI 19.84 kg/m  CONSTITUTIONAL: Alert and oriented and responds appropriately to questions. Well-appearing; well-nourished, thin, in no distress, extremely pleasant HEAD: Normocephalic EYES: Conjunctivae clear, pupils appear equal, EOM appear intact ENT: normal nose; moist mucous membranes NECK: Supple, normal ROM CARD: RRR; S1 and S2 appreciated; no murmurs, no clicks, no rubs, no gallops RESP: Normal chest excursion without splinting or tachypnea; breath sounds clear and equal bilaterally; no wheezes, no rhonchi, no rales, no hypoxia or respiratory distress, speaking full sentences ABD/GI: Normal bowel sounds; non-distended; soft, non-tender, no rebound, no guarding, no peritoneal signs, no hepatosplenomegaly BACK:  The back appears normal EXT: Normal ROM in all joints; no deformity noted, no edema; no cyanosis SKIN: Normal color for age and race; warm; no rash on exposed skin NEURO: Moves all extremities equally PSYCH: The patient's mood and manner are appropriate.   MEDICAL DECISION MAKING: Patient here with cyclic vomiting.  States she previously smoked marijuana but has quit over a month ago.  She is being followed by gastroenterology with [facility]  physicians.  No known history of gastroparesis, diabetes.  Discussed plan of care with patient to include repeat labs, urine.  Abdominal exam benign and I do not feel she needs emergent imaging.  Doubt appendicitis, cholecystitis, pancreatitis, perforation, bowel obstruction, diverticulitis today.  Plan will be to hydrate patient with IV fluids, treat with Haldol and reassess.  ED PROGRESS: 4:20 AM  Pt's labs show mild leukocytosis with left shift which appears chronic.  Electrolytes, renal function, LFTs, lipase appear normal.  Urine does show white blood cells, red blood cells and bacteria but also many squamous cells.  She is not having urinary symptoms.  Suspect this is a dirty catch.  Pregnancy test is negative.  After 1 dose of IV Haldol and IV fluids, patient is sleeping comfortably.  After arousal, patient states she is feeling much better and would like to try crackers and ginger ale.    4:50 AM  Pt has been able to drink some ginger ale without vomiting.  States she has no nausea at this time  is comfortable with plan for discharge home.  She has Phenergan at home.  We will proceed bide with prescription for Reglan to take as needed.  Recommended follow-up with her gastroenterologist and continued abstinence from marijuana.   At this time, I do not feel there is any life-threatening condition present. I have reviewed, interpreted and discussed all results (EKG, imaging, lab, urine as appropriate) and exam findings with patient/family. I have reviewed nursing notes and appropriate previous records.  I feel the patient is safe to be discharged home without further emergent workup and can continue workup as an outpatient as needed. Discussed usual and customary return precautions. Patient/family verbalize understanding and are comfortable with this plan.  Outpatient follow-up has been provided as needed. All questions have been answered.  SHANNONE LOVULLO was evaluated in Emergency Department on  05/02/2019 for the symptoms described in the history of present illness. She was evaluated in the context of the global COVID-19 pandemic, which necessitated consideration that the patient might be at risk for infection with the SARS-CoV-2 virus that causes COVID-19. Institutional protocols and algorithms that pertain to the evaluation of patients at risk for COVID-19 are in a state of rapid change based on information released by regulatory bodies including the CDC and federal and state organizations. These policies and algorithms were followed during the patient's care in the ED.  Patient was seen wearing N95, face shield, gloves.    Stacey Maura, Delice Bison, DO 05/02/19 [phone]

## 2019-05-02 NOTE — ED Triage Notes (Signed)
C/o 6 episodes of vomiting since 5pm today. She has been seen for same earlier today

## 2019-05-02 NOTE — Discharge Instructions (Signed)
I recommend that you continue to abstain from marijuana use.  Your labs, urine were normal today other than signs of mild dehydration.  Please continue bland diet and increase fluid intake.

## 2019-05-02 NOTE — ED Notes (Signed)
ED Provider at bedside. 

## 2019-05-02 NOTE — ED Notes (Signed)
Pt provided gingerale and crackers for PO challenge 

## 2019-05-02 NOTE — ED Provider Notes (Signed)
Emergency Department Provider Note   I have reviewed the triage vital signs and the nursing notes.   HISTORY  Chief Complaint Emesis   HPI Debbie Hughes is a 28 y.o. female returns to the ED with vomiting and abdominal cramping.  This is her third visit in the past 2 days and second visit in the last 24 hours for similar presentation.  She has had multiple prior ED visits in the past 6 months with her last admission in July.  Patient tells me that she felt better after leaving the emergency department earlier this morning after receiving IV fluids and Haldol.  She began to feel nauseated once at home again and developed additional vomiting.  She been vomiting approximately every 30 minutes for several hours and had difficulty sleeping so return to the emergency department.  She has any fevers or chest pain.  She did have rectal Phenergan at home but did not try that.  She tells me she tried it before her 12/25 visit and it did not help.  She has followed in the past with gastroenterology and tells me that she stopped smoking marijuana in October of this year although her UDS was positive for [facility] on the 25th.    Past Medical History:  Diagnosis Date  . Breast mass in female 01/21/06  . Breast nodule 04/26/10   Left  . BV (bacterial vaginosis) 5.10/11  . Candida vaginitis 04/26/10  . Cyclical vomiting   . Ectopic pregnancy 03/24/2013  . Fibroadenoma   . H/O varicella   . History of chlamydia   . Irregular menstrual cycle 07/28/07  . 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 Vaginal discharge 10/01/07  . Vaginitis     Patient Active Problem List   Diagnosis Date Noted  . Intractable nausea and vomiting 11/08/2018  . Hypokalemia 11/08/2018  . Leukocytosis 11/08/2018  . Hyponatremia 11/08/2018  . Normal postpartum course 08/27/2018  . Indication for care in labor or delivery 08/26/2018  . Nausea and vomiting during pregnancy 08/16/2018  .  Abdominal pain during pregnancy in third trimester 08/16/2018  . Emesis 08/16/2018  . Preterm contractions 08/16/2018  . Preterm labor 07/25/2017  . NSVD (normal spontaneous vaginal delivery) 07/25/2017  . Pregnant 12/17/2016  . Breast cyst, left (excised in 2007) 11/23/2016  . Hyperemesis gravidarum 11/22/2016  . Acute generalized abdominal pain 11/22/2016  . Allergy to penicillin 09/22/2013  . Hx of ectopic pregnancy---s/p left salpingectomy 03/2013 09/22/2013    Past Surgical History:  Procedure Laterality Date  . BREAST CYST EXCISION Left 2007  . DILATION AND CURETTAGE OF UTERUS    . LAPAROSCOPY N/A 03/24/2013   Procedure: LAPAROSCOPY OPERATIVE;  Surgeon: Betsy Coder, MD;  Location: [facility];  Service: Gynecology;  Laterality: N/A;  . Left Salpingectomy  03/24/2013   Ectopic Pregnancy  . UNILATERAL SALPINGECTOMY Left 03/24/2013   Procedure: UNILATERAL SALPINGECTOMY;  Surgeon: Betsy Coder, MD;  Location: [facility];  Service: Gynecology;  Laterality: Left;    Allergies Penicillins  Family History  Problem Relation Age of Onset  . Asthma Maternal Grandmother   . Hypertension Maternal Grandmother   . Diabetes Maternal Grandmother   . Hypertension Paternal Grandfather   . Diabetes Paternal Grandfather     Social History Social History   Tobacco Use  . Smoking status: Former Research scientist (life sciences)  . Smokeless tobacco: Never Used  Substance Use Topics  . Alcohol use: Yes    Comment: occasional   .  Drug use: Not Currently    Types: Marijuana    Comment: states she has not smoked marijuana in 2 months    Review of Systems  Constitutional: No fever/chills Eyes: No visual changes. ENT: No sore throat. Cardiovascular: Denies chest pain. Respiratory: Denies shortness of breath. Gastrointestinal: Positive cramping abdominal pain. Positive nausea and vomiting.  No diarrhea.  No constipation. Genitourinary: Negative for dysuria. Musculoskeletal: Negative for back pain. Skin:  Negative for rash. Neurological: Negative for headaches, focal weakness or numbness.  10-point ROS otherwise negative.  ____________________________________________   PHYSICAL EXAM:  VITAL SIGNS: ED Triage Vitals  Enc Vitals Group     BP 05/02/19 2218 (!) 137/94     Pulse Rate 05/02/19 2218 80     Resp 05/02/19 2218 16     Temp 05/02/19 2218 99.2 F (37.3 C)     Temp Source 05/02/19 2218 Oral     SpO2 05/02/19 2218 99 %     Weight 05/02/19 2213 106 lb (48.1 kg)     Height 05/02/19 2213 5\' 3"  (1.6 m)   Constitutional: Alert and oriented. Well appearing and in no acute distress. Eyes: Conjunctivae are normal.  Head: Atraumatic. Nose: No congestion/rhinnorhea. Mouth/Throat: Mucous membranes are moist.  Neck: No stridor.   Cardiovascular: Normal rate, regular rhythm. Good peripheral circulation. Grossly normal heart sounds.   Respiratory: Normal respiratory effort.  No retractions. Lungs CTAB. Gastrointestinal: Soft and nontender. No distention.  Musculoskeletal: No gross deformities of extremities. Neurologic:  Normal speech and language.  Skin:  Skin is warm, dry and intact. No rash noted.   ____________________________________________   PROCEDURES  Procedure(s) performed:   Procedures  None  ____________________________________________   INITIAL IMPRESSION / ASSESSMENT AND PLAN / ED COURSE  Pertinent labs & imaging results that were available during my care of the patient were reviewed by me and considered in my medical decision making (see chart for details).   Patient with history of cyclical vomiting question related to [facility] returns with nausea vomiting.  Abdominal exam is reassuring.  Patient is awake and alert with largely normal vitals.  She had labs drawn at 3 AM this morning.  I do not plan on repeating most.  I reviewed her lab work and presentations from both the 25th and 27th.  Plan for IV fluids, Haldol, Protonix, and reassess.   Care transferred to  Dr. Christy Gentles pending reassess after meds.  ____________________________________________  FINAL CLINICAL IMPRESSION(S) / ED DIAGNOSES  Final diagnoses:  Intractable vomiting with nausea, unspecified vomiting type     MEDICATIONS GIVEN DURING THIS VISIT:  Medications  sodium chloride 0.9 % bolus 1,000 mL (0 mLs Intravenous Stopped 05/02/19 2343)  haloperidol lactate (HALDOL) injection 2 mg (2 mg Intravenous Given 05/02/19 2244)  pantoprazole (PROTONIX) injection 40 mg (40 mg Intravenous Given 05/02/19 2244)     Note:  This document was prepared using Dragon voice recognition software and may include unintentional dictation errors.  Nanda Quinton, MD, [facility] Emergency Medicine    Long, Wonda Olds, MD 05/03/19 [phone]

## 2019-05-03 ENCOUNTER — Other Ambulatory Visit (HOSPITAL_COMMUNITY): Payer: Self-pay | Admitting: Physician Assistant

## 2019-05-03 ENCOUNTER — Other Ambulatory Visit: Payer: Self-pay | Admitting: Physician Assistant

## 2019-05-03 DIAGNOSIS — R112 Nausea with vomiting, unspecified: Secondary | ICD-10-CM

## 2019-05-03 MED ORDER — PROMETHAZINE HCL 25 MG RE SUPP: 25.0000 mg | Freq: Four times a day (QID) | RECTAL | 0 refills | Status: DC | PRN

## 2019-05-03 NOTE — ED Provider Notes (Signed)
I assumed care in signout to reassess patient after meds.  Patient has a history of cyclical vomiting syndrome who presented with another round of vomiting/abdominal pain.  She received IV fluids and Haldol and is now improved.  She is awake alert in no acute distress.  She is able to take p.o. fluids without difficulty. She just had labs done on previous ER visit that were overall reassuring Patient is requesting a new prescription for Phenergan She plans to follow-up with gastroenterology this week. Patient appears appropriate and stable for discharge home   Ripley Fraise, MD 05/03/19 [phone]

## 2019-05-03 NOTE — Discharge Instructions (Addendum)
  SEEK IMMEDIATE MEDICAL ATTENTION IF: A temperature above 100.61F develops.  Repeated vomiting occurs (multiple episodes) with blood mixed in the vomit Blood is being passed in stools or vomit (bright red or black tarry stools).  Return also if you develop chest pain, difficulty breathing, dizziness or fainting, or become confused, poorly responsive, or inconsolable.

## 2019-05-03 NOTE — ED Notes (Signed)
  Patient given water and crackers for PO challenge.  Patient states she feels better and sat up in bed to drink fluids.

## 2019-05-08 ENCOUNTER — Emergency Department (HOSPITAL_BASED_OUTPATIENT_CLINIC_OR_DEPARTMENT_OTHER)
Admission: EM | Admit: 2019-05-08 | Discharge: 2019-05-09 | Disposition: A | Discharge status: Home / Self Care | Payer: BC Managed Care – PPO | Attending: Emergency Medicine | Admitting: Emergency Medicine

## 2019-05-08 ENCOUNTER — Encounter (HOSPITAL_BASED_OUTPATIENT_CLINIC_OR_DEPARTMENT_OTHER): Payer: Self-pay | Admitting: Emergency Medicine

## 2019-05-08 ENCOUNTER — Other Ambulatory Visit: Payer: Self-pay

## 2019-05-08 DIAGNOSIS — E876 Hypokalemia: Secondary | ICD-10-CM | POA: Diagnosis not present

## 2019-05-08 DIAGNOSIS — Z87891 Personal history of nicotine dependence: Secondary | ICD-10-CM | POA: Diagnosis not present

## 2019-05-08 DIAGNOSIS — R1115 Cyclical vomiting syndrome unrelated to migraine: Secondary | ICD-10-CM | POA: Diagnosis present

## 2019-05-08 LAB — CBC WITH DIFFERENTIAL/PLATELET
Abs Immature Granulocytes: 0.03 K/uL (ref 0.00–0.07)
Basophils Absolute: 0.1 K/uL (ref 0.0–0.1)
Basophils Relative: 1 %
Eosinophils Absolute: 0.1 K/uL (ref 0.0–0.5)
Eosinophils Relative: 1 %
HCT: 41.4 % (ref 36.0–46.0)
Hemoglobin: 14 g/dL (ref 12.0–15.0)
Immature Granulocytes: 0 %
Lymphocytes Relative: 20 %
Lymphs Abs: 2 K/uL (ref 0.7–4.0)
MCH: 32.9 pg (ref 26.0–34.0)
MCHC: 33.8 g/dL (ref 30.0–36.0)
MCV: 97.4 fL (ref 80.0–100.0)
Monocytes Absolute: 0.3 K/uL (ref 0.1–1.0)
Monocytes Relative: 3 %
Neutro Abs: 7.8 K/uL — ABNORMAL HIGH (ref 1.7–7.7)
Neutrophils Relative %: 75 %
Platelets: 284 K/uL (ref 150–400)
RBC: 4.25 MIL/uL (ref 3.87–5.11)
RDW: 11.4 % — ABNORMAL LOW (ref 11.5–15.5)
WBC: 10.3 K/uL (ref 4.0–10.5)
nRBC: 0 % (ref 0.0–0.2)

## 2019-05-08 LAB — COMPREHENSIVE METABOLIC PANEL
ALT: 16 U/L (ref 0–44)
AST: 25 U/L (ref 15–41)
Albumin: 4.8 g/dL (ref 3.5–5.0)
Alkaline Phosphatase: 56 U/L (ref 38–126)
Anion gap: 11 (ref 5–15)
BUN: 16 mg/dL (ref 6–20)
CO2: 23 mmol/L (ref 22–32)
Calcium: 9.4 mg/dL (ref 8.9–10.3)
Chloride: 104 mmol/L (ref 98–111)
Creatinine, Ser: 0.7 mg/dL (ref 0.44–1.00)
GFR calc Af Amer: >60 mL/min (ref >60)
GFR calc non Af Amer: >60 mL/min (ref >60)
Glucose, Bld: 106 mg/dL — ABNORMAL HIGH (ref 70–99)
Potassium: 3 mmol/L — ABNORMAL LOW (ref 3.5–5.1)
Sodium: 138 mmol/L (ref 135–145)
Total Bilirubin: 0.4 mg/dL (ref 0.3–1.2)
Total Protein: 7.9 g/dL (ref 6.5–8.1)

## 2019-05-08 LAB — URINALYSIS, ROUTINE W REFLEX MICROSCOPIC
Bilirubin Urine: NEGATIVE
Glucose, UA: NEGATIVE mg/dL
Hgb urine dipstick: NEGATIVE
Ketones, ur: NEGATIVE mg/dL
Leukocytes,Ua: NEGATIVE
Nitrite: NEGATIVE
Protein, ur: NEGATIVE mg/dL
Specific Gravity, Urine: 1.015 (ref 1.005–1.030)
pH: 8.5 — ABNORMAL HIGH (ref 5.0–8.0)

## 2019-05-08 LAB — PREGNANCY, URINE: Preg Test, Ur: NEGATIVE

## 2019-05-08 LAB — LIPASE, BLOOD: Lipase: 57 U/L — ABNORMAL HIGH (ref 11–51)

## 2019-05-08 MED ORDER — POTASSIUM CHLORIDE ER 10 MEQ PO TBCR: 20.0000 meq | EXTENDED_RELEASE_TABLET | Freq: Every day | ORAL | 0 refills | Status: DC

## 2019-05-08 MED ORDER — POTASSIUM CHLORIDE 20 MEQ/15ML (10%) PO SOLN
ORAL | Status: AC
  Administered 2019-05-08: 40 meq
  Filled 2019-05-08: qty 30

## 2019-05-08 MED ORDER — SODIUM CHLORIDE 0.9 % IV BOLUS
500.0000 mL | Freq: Once | INTRAVENOUS | Status: AC
  Administered 2019-05-08: 500 mL via INTRAVENOUS

## 2019-05-08 MED ORDER — POTASSIUM CHLORIDE CRYS ER 20 MEQ PO TBCR: 40.0000 meq | EXTENDED_RELEASE_TABLET | Freq: Once | ORAL | Status: DC

## 2019-05-08 MED ORDER — HALOPERIDOL LACTATE 5 MG/ML IJ SOLN
2.0000 mg | Freq: Once | INTRAMUSCULAR | Status: AC
  Administered 2019-05-08: 2 mg via INTRAVENOUS
  Filled 2019-05-08: qty 1

## 2019-05-08 MED ORDER — ONDANSETRON 4 MG PO TBDP
ORAL_TABLET | ORAL | Status: AC
  Administered 2019-05-08: 21:00:00 4 mg
  Filled 2019-05-08: qty 1

## 2019-05-08 NOTE — ED Provider Notes (Addendum)
[facility] EMERGENCY DEPARTMENT Provider Note   CSN: [id number] Arrival date & time: 05/08/19  2049     History Chief Complaint  Patient presents with  . Vomiting    Debbie Hughes is a 29 y.o. female presents today for nausea and vomiting.  Patient reports that 2 PM today she developed generalized abdominal cramping, moderate intensity constant nonradiating without alleviating or aggravating factors.  This was shortly followed by multiple episodes of nonbloody/nonbilious emesis.  Patient reports that she has been dry heaving for several hours.  She reports a history of cyclical vomiting and is currently being evaluated by gastroenterology for this problem but has not come to a clear diagnosis yet.  Denies fevers/chills, fall/injury, chest pain/shortness of breath, diarrhea, dysuria/hematuria, vaginal bleeding/discharge or additional concerns. HPI     Past Medical History:  Diagnosis Date  . Breast mass in female 01/21/06  . Breast nodule 04/26/10   Left  . BV (bacterial vaginosis) 5.10/11  . Candida vaginitis 04/26/10  . Cyclical vomiting   . Ectopic pregnancy 03/24/2013  . Fibroadenoma   . H/O varicella   . History of chlamydia   . Irregular menstrual cycle 07/28/07  . 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 Vaginal discharge 10/01/07  . Vaginitis     Patient Active Problem List   Diagnosis Date Noted  . Intractable nausea and vomiting 11/08/2018  . Hypokalemia 11/08/2018  . Leukocytosis 11/08/2018  . Hyponatremia 11/08/2018  . Normal postpartum course 08/27/2018  . Indication for care in labor or delivery 08/26/2018  . Nausea and vomiting during pregnancy 08/16/2018  . Abdominal pain during pregnancy in third trimester 08/16/2018  . Emesis 08/16/2018  . Preterm contractions 08/16/2018  . Preterm labor 07/25/2017  . NSVD (normal spontaneous vaginal delivery) 07/25/2017  . Pregnant 12/17/2016  . Breast cyst, left  (excised in 2007) 11/23/2016  . Hyperemesis gravidarum 11/22/2016  . Acute generalized abdominal pain 11/22/2016  . Allergy to penicillin 09/22/2013  . Hx of ectopic pregnancy---s/p left salpingectomy 03/2013 09/22/2013    Past Surgical History:  Procedure Laterality Date  . BREAST CYST EXCISION Left 2007  . DILATION AND CURETTAGE OF UTERUS    . LAPAROSCOPY N/A 03/24/2013   Procedure: LAPAROSCOPY OPERATIVE;  Surgeon: Betsy Coder, MD;  Location: [facility];  Service: Gynecology;  Laterality: N/A;  . Left Salpingectomy  03/24/2013   Ectopic Pregnancy  . UNILATERAL SALPINGECTOMY Left 03/24/2013   Procedure: UNILATERAL SALPINGECTOMY;  Surgeon: Betsy Coder, MD;  Location: [facility];  Service: Gynecology;  Laterality: Left;     OB History    Gravida  5   Para  3   Term  2   Preterm  1   AB  2   Living  3     SAB  1   TAB      Ectopic  1   Multiple  0   Live Births  3           Family History  Problem Relation Age of Onset  . Asthma Maternal Grandmother   . Hypertension Maternal Grandmother   . Diabetes Maternal Grandmother   . Hypertension Paternal Grandfather   . Diabetes Paternal Grandfather     Social History   Tobacco Use  . Smoking status: Former Research scientist (life sciences)  . Smokeless tobacco: Never Used  Substance Use Topics  . Alcohol use: Yes    Comment: occasional   . Drug use: Not Currently  Types: Marijuana    Comment: states she has not smoked marijuana in 2 months    Home Medications Prior to Admission medications   Medication Sig Start Date End Date Taking? Authorizing Provider  promethazine (PHENERGAN) 25 MG suppository Place 1 suppository (25 mg total) rectally every 6 (six) hours as needed for nausea or vomiting. 05/03/19  Yes Ripley Fraise, MD  metoCLOPramide (REGLAN) 10 MG tablet Take 1 tablet (10 mg total) by mouth every 6 (six) hours as needed for nausea. 05/02/19   Ward, Delice Bison, DO  pantoprazole (PROTONIX) 40 MG tablet Take 1 tablet (40  mg total) by mouth daily. 11/09/18 12/09/18  Antonieta Pert, MD  potassium chloride (KLOR-CON) 10 MEQ tablet Take 2 tablets (20 mEq total) by mouth daily for 3 days. 05/08/19 05/11/19  Deliah Boston, PA-C    Allergies    Penicillins  Review of Systems   Review of Systems Ten systems are reviewed and are negative for acute change except as noted in the HPI  Physical Exam Updated Vital Signs BP (!) 112/99 (BP Location: Left Arm)   Pulse 79   Temp 98 F (36.7 C) (Oral)   Resp 18   Wt 51.8 kg   SpO2 100%   BMI 20.23 kg/m   Physical Exam Constitutional:      General: She is not in acute distress.    Appearance: Normal appearance. She is well-developed. She is not ill-appearing or diaphoretic.  HENT:     Head: Normocephalic and atraumatic.     Right Ear: External ear normal.     Left Ear: External ear normal.     Nose: Nose normal.  Eyes:     General: Vision grossly intact. Gaze aligned appropriately.     Pupils: Pupils are equal, round, and reactive to light.  Neck:     Trachea: Trachea and phonation normal. No tracheal deviation.  Pulmonary:     Effort: Pulmonary effort is normal. No respiratory distress.  Abdominal:     General: There is no distension.     Palpations: Abdomen is soft.     Tenderness: There is generalized abdominal tenderness. There is no guarding or rebound.  Musculoskeletal:        General: Normal range of motion.     Cervical back: Normal range of motion.  Skin:    General: Skin is warm and dry.  Neurological:     Mental Status: She is alert.     GCS: GCS eye subscore is 4. GCS verbal subscore is 5. GCS motor subscore is 6.     Comments: Speech is clear and goal oriented, follows commands Major Cranial nerves without deficit, no facial droop Moves extremities without ataxia, coordination intact  Psychiatric:        Behavior: Behavior normal.    ED Results / Procedures / Treatments   Labs (all labs ordered are listed, but only abnormal results are  displayed) Labs Reviewed  CBC WITH DIFFERENTIAL/PLATELET - Abnormal; Notable for the following components:      Result Value   RDW 11.4 (*)    Neutro Abs 7.8 (*)    All other components within normal limits  COMPREHENSIVE METABOLIC PANEL - Abnormal; Notable for the following components:   Potassium 3.0 (*)    Glucose, Bld 106 (*)    All other components within normal limits  LIPASE, BLOOD - Abnormal; Notable for the following components:   Lipase 57 (*)    All other components within normal limits  URINALYSIS, ROUTINE W REFLEX MICROSCOPIC - Abnormal; Notable for the following components:   pH 8.5 (*)    All other components within normal limits  PREGNANCY, URINE    EKG EKG Interpretation  Date/Time:  Saturday May 08 2019 23:37:32 EST Ventricular Rate:  76 PR Interval:  168 QRS Duration: 96 QT Interval:  402 QTC Calculation: 452 R Axis:   92 Text Interpretation: Normal sinus rhythm with sinus arrhythmia Rightward axis Incomplete right bundle branch block Possible Anterior infarct , age undetermined Abnormal ECG Artifact Confirmed by Ezequiel Essex [id number]) on 05/08/2019 11:50:08 PM   Radiology No results found.  Procedures Procedures (including critical care time)  Medications Ordered in ED Medications  ondansetron (ZOFRAN-ODT) 4 MG disintegrating tablet (4 mg  Given 05/08/19 2107)  haloperidol lactate (HALDOL) injection 2 mg (2 mg Intravenous Given 05/08/19 2150)  sodium chloride 0.9 % bolus 500 mL (0 mLs Intravenous Stopped 05/08/19 2223)  potassium chloride 20 MEQ/15ML (10%) solution (40 mEq  Given 05/08/19 2331)    ED Course  I have reviewed the triage vital signs and the nursing notes.  Pertinent labs & imaging results that were available during my care of the patient were reviewed by me and considered in my medical decision making (see chart for details).    MDM Rules/Calculators/A&P                     29 year old female with history of cyclical vomiting  arrives today with nausea and vomiting since 2 PM, not abdominal cramping as well.  No rebound, guarding peritoneal signs.  She is brought back to hallway bed after a fall in the lobby.  She denies any pain from her fall.  On initial evaluation she is sitting up in bed and right knee pain persists.  She was given 2 mg Haldol.  Assessed approximately 10 minutes later dry heaving and stop she is sleeping comfortably and in no acute distress. - CBC nonacute CMP potassium 3.0, nonacute Lipase 57 Urinalysis in the emergency Patient reassessed resting comfortably no acute distress, she is requesting discharge at this time is reports she is feeling better and plans to follow-up with her gastroenterologist.  She denies any alcohol use or marijuana use.  We will begin potassium repletion in the ER and discharged with supplements.  With reassuring lab work, history and exam doubt appendicitis, SBO, perforation or other acute intra-abdominal etiology patient's symptoms at this time. - Urinalysis nonacute Urine pregnancy negative  At this time there does not appear to be any evidence of an acute emergency medical condition and the patient appears stable for discharge with appropriate outpatient follow up. Diagnosis was discussed with patient who verbalizes understanding of care plan and is agreeable to discharge. I have discussed return precautions with patient who verbalizes understanding of return precautions. Patient encouraged to follow-up with their PCP and GI. All questions answered.   Patient's case discussed with Dr. Meriel Pica who agrees with plan to discharge with follow-up.   Note: Portions of this report may have been transcribed using voice recognition software. Every effort was made to ensure accuracy; however, inadvertent computerized transcription errors may still be present. Final Clinical Impression(s) / ED Diagnoses Final diagnoses:  Cyclical vomiting  Hypokalemia    Rx / DC Orders ED  Discharge Orders         Ordered    potassium chloride (KLOR-CON) 10 MEQ tablet  Daily     05/08/19 2355  Deliah Boston, PA-C 05/08/19 2357    Deliah Boston, PA-C 05/08/19 2358    Ezequiel Essex, MD 05/09/19 [phone]

## 2019-05-08 NOTE — ED Notes (Signed)
Tolerating po fluids

## 2019-05-08 NOTE — ED Notes (Signed)
Pt ambulated to lobby bathroom despite being told by this RN to remain in wheelchair. Pulled call bell in bathroom and this RN responded, pt had locked door so delay in response time. Pt states she fell again. States she keeps blacking out. Moved to hallway bed. EDP made aware.

## 2019-05-08 NOTE — ED Triage Notes (Signed)
Reports nausea and vomiting for five hours. Also endorsing abd pain.

## 2019-05-08 NOTE — ED Notes (Addendum)
Pt states she is ready to go and her ride is here to pick her up. Pt requested liquid potassium. PA aware and states this is ok.

## 2019-05-08 NOTE — ED Notes (Signed)
Assisted patient to the bathroom in a wheelchair. Tolerated well. Assisted pt back to her stretcher.

## 2019-05-08 NOTE — Discharge Instructions (Addendum)
You have been diagnosed today with cyclical vomiting, hypokalemia.  At this time there does not appear to be the presence of an emergent medical condition, however there is always the potential for conditions to change. Please read and follow the below instructions.  Please return to the Emergency Department immediately for any new or worsening symptoms. Please be sure to follow up with your Primary Care Provider within one week regarding your visit today; please call their office to schedule an appointment even if you are feeling better for a follow-up visit. Your potassium level was low today, please take the potassium supplement as prescribed to help with your symptoms.  Additionally your lipase level was slightly elevated today please discuss this with your gastroenterologist and your primary care provider at your follow-up visits. Do not use alcohol or smoke marijuana as these may worsen your symptoms.  Please call your gastroenterologist tomorrow morning to schedule a follow-up appointment.  Drink plenty water and get plenty rest.  Get help right away if: You have blood in your vomit. Your vomit looks like coffee grounds. You have stools that are bloody or black, or stools that look like tar. You have signs of dehydration, such as: Sunken eyes. Not making tears while crying. Very dry mouth. Cracked lips. Decreased urine production. Dark urine. Urine may be the color of tea. Weakness. Sleepiness. Have chest pain. Have shortness of breath. Have vomiting or diarrhea that lasts for more than 2 days. Faint. You have any new/concerning or worsening of symptoms  Please read the additional information packets attached to your discharge summary.  Do not take your medicine if  develop an itchy rash, swelling in your mouth or lips, or difficulty breathing; call 911 and seek immediate emergency medical attention if this occurs.  Note: Portions of this text may have been transcribed using  voice recognition software. Every effort was made to ensure accuracy; however, inadvertent computerized transcription errors may still be present.

## 2019-05-08 NOTE — ED Notes (Signed)
RN stepped out of triage to get zofran and found patient laying supine on the floor. Rousable to verbal/painful stimuli. No evident injury, trauma noted. Charge nurse notified.

## 2019-05-09 NOTE — ED Notes (Signed)
Refused vitals at discharge.

## 2019-05-14 ENCOUNTER — Ambulatory Visit (HOSPITAL_COMMUNITY)
Admission: RE | Admit: 2019-05-14 | Discharge: 2019-05-14 | Disposition: A | Discharge status: Home / Self Care | Payer: BC Managed Care – PPO | Source: Ambulatory Visit | Attending: Physician Assistant | Admitting: Physician Assistant

## 2019-05-14 ENCOUNTER — Other Ambulatory Visit: Payer: Self-pay

## 2019-05-14 ENCOUNTER — Other Ambulatory Visit (HOSPITAL_COMMUNITY): Payer: Self-pay | Admitting: Physician Assistant

## 2019-05-14 DIAGNOSIS — R112 Nausea with vomiting, unspecified: Secondary | ICD-10-CM | POA: Diagnosis present

## 2019-05-14 MED ORDER — TECHNETIUM TC 99M MEBROFENIN IV KIT
5.3900 | PACK | Freq: Once | INTRAVENOUS | Status: AC | PRN
  Administered 2019-05-14: 5.39 via INTRAVENOUS

## 2019-08-07 ENCOUNTER — Encounter (HOSPITAL_BASED_OUTPATIENT_CLINIC_OR_DEPARTMENT_OTHER): Payer: Self-pay | Admitting: Emergency Medicine

## 2019-08-07 ENCOUNTER — Other Ambulatory Visit: Payer: Self-pay

## 2019-08-07 ENCOUNTER — Inpatient Hospital Stay (HOSPITAL_BASED_OUTPATIENT_CLINIC_OR_DEPARTMENT_OTHER)
Admission: EM | Admit: 2019-08-07 | Discharge: 2019-08-11 | DRG: 395 | Disposition: A | Discharge status: Home / Self Care | Payer: BC Managed Care – PPO | Attending: Internal Medicine | Admitting: Internal Medicine

## 2019-08-07 DIAGNOSIS — D72828 Other elevated white blood cell count: Secondary | ICD-10-CM | POA: Diagnosis present

## 2019-08-07 DIAGNOSIS — E860 Dehydration: Secondary | ICD-10-CM

## 2019-08-07 DIAGNOSIS — D72829 Elevated white blood cell count, unspecified: Secondary | ICD-10-CM

## 2019-08-07 DIAGNOSIS — E876 Hypokalemia: Secondary | ICD-10-CM | POA: Diagnosis present

## 2019-08-07 DIAGNOSIS — F1220 Cannabis dependence, uncomplicated: Secondary | ICD-10-CM | POA: Diagnosis present

## 2019-08-07 DIAGNOSIS — R1115 Cyclical vomiting syndrome unrelated to migraine: Secondary | ICD-10-CM | POA: Diagnosis present

## 2019-08-07 DIAGNOSIS — R7989 Other specified abnormal findings of blood chemistry: Secondary | ICD-10-CM

## 2019-08-07 DIAGNOSIS — R945 Abnormal results of liver function studies: Secondary | ICD-10-CM

## 2019-08-07 DIAGNOSIS — Z87891 Personal history of nicotine dependence: Secondary | ICD-10-CM

## 2019-08-07 DIAGNOSIS — Z79899 Other long term (current) drug therapy: Secondary | ICD-10-CM

## 2019-08-07 DIAGNOSIS — Z880 Allergy status to penicillin: Secondary | ICD-10-CM

## 2019-08-07 DIAGNOSIS — R112 Nausea with vomiting, unspecified: Secondary | ICD-10-CM

## 2019-08-07 DIAGNOSIS — Z20822 Contact with and (suspected) exposure to covid-19: Secondary | ICD-10-CM | POA: Diagnosis present

## 2019-08-07 LAB — CBC WITH DIFFERENTIAL/PLATELET
Abs Immature Granulocytes: 0.08 10*3/uL — ABNORMAL HIGH (ref 0.00–0.07)
Basophils Absolute: 0 10*3/uL (ref 0.0–0.1)
Basophils Relative: 0 %
Eosinophils Absolute: 0 10*3/uL (ref 0.0–0.5)
Eosinophils Relative: 0 %
HCT: 39.7 % (ref 36.0–46.0)
Hemoglobin: 13.2 g/dL (ref 12.0–15.0)
Immature Granulocytes: 1 %
Lymphocytes Relative: 4 %
Lymphs Abs: 0.7 10*3/uL (ref 0.7–4.0)
MCH: 33 pg (ref 26.0–34.0)
MCHC: 33.2 g/dL (ref 30.0–36.0)
MCV: 99.3 fL (ref 80.0–100.0)
Monocytes Absolute: 0.5 10*3/uL (ref 0.1–1.0)
Monocytes Relative: 3 %
Neutro Abs: 16 10*3/uL — ABNORMAL HIGH (ref 1.7–7.7)
Neutrophils Relative %: 92 %
Platelets: 197 10*3/uL (ref 150–400)
RBC: 4 MIL/uL (ref 3.87–5.11)
RDW: 11.6 % (ref 11.5–15.5)
WBC: 17.3 10*3/uL — ABNORMAL HIGH (ref 4.0–10.5)
nRBC: 0 % (ref 0.0–0.2)

## 2019-08-07 MED ORDER — HALOPERIDOL LACTATE 5 MG/ML IJ SOLN
INTRAMUSCULAR | Status: AC
  Administered 2019-08-07: 2 mg via INTRAVENOUS
  Filled 2019-08-07: qty 1

## 2019-08-07 MED ORDER — HALOPERIDOL LACTATE 5 MG/ML IJ SOLN
2.0000 mg | Freq: Once | INTRAMUSCULAR | Status: AC
  Administered 2019-08-07: 2 mg via INTRAVENOUS
  Filled 2019-08-07: qty 1

## 2019-08-07 MED ORDER — HALOPERIDOL LACTATE 5 MG/ML IJ SOLN: 2.0000 mg | Freq: Once | INTRAMUSCULAR | Status: AC

## 2019-08-07 MED ORDER — SODIUM CHLORIDE 0.9 % IV BOLUS (SEPSIS)
1000.0000 mL | Freq: Once | INTRAVENOUS | Status: AC
  Administered 2019-08-07: 1000 mL via INTRAVENOUS

## 2019-08-07 NOTE — ED Triage Notes (Signed)
Pt states that she has been throwing up since 1500 - she reports that she feels like she is dehydrated. Her kids have a stomach bug right now.

## 2019-08-07 NOTE — ED Notes (Addendum)
Reminded pt about urine sample needed

## 2019-08-07 NOTE — ED Notes (Signed)
Pt vomited a small amount of emesis.  Dr. Leonides Schanz aware.

## 2019-08-07 NOTE — ED Provider Notes (Signed)
TIME SEEN: 10:59 PM  CHIEF COMPLAINT: Nausea, vomiting, diarrhea  HPI: Patient is a 29 year old female with history of cyclic vomiting who presents to the emergency department with nausea, vomiting that started at 3 PM today.  Describes it as nonbloody, nonbilious.  Feels lightheaded.  Has had some diarrhea.  No fevers, chills, vaginal bleeding or discharge, dysuria or hematuria.  States her children have a stomach bug and have similar symptoms.  She denies any abdominal pain.  She is followed by [facility] gastroenterology.  She had a normal HIDA in January 2020 on review of her records.  She states that she does not smoke marijuana.  She states she does use a vape pen that has CBD oil.  ROS: See HPI Constitutional: no fever  Eyes: no drainage  ENT: no runny nose   Cardiovascular:  no chest pain  Resp: no SOB  GI: Vomiting and diarrhea GU: no dysuria Integumentary: no rash  Allergy: no hives  Musculoskeletal: no leg swelling  Neurological: no slurred speech ROS otherwise negative  PAST MEDICAL HISTORY/PAST SURGICAL HISTORY:  Past Medical History:  Diagnosis Date  . Breast mass in female 01/21/06  . Breast nodule 04/26/10   Left  . BV (bacterial vaginosis) 5.10/11  . Candida vaginitis 04/26/10  . Cyclical vomiting   . Ectopic pregnancy 03/24/2013  . Fibroadenoma   . H/O varicella   . History of chlamydia   . Irregular menstrual cycle 07/28/07  . 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 Vaginal discharge 10/01/07  . Vaginitis     MEDICATIONS:  Prior to Admission medications   Medication Sig Start Date End Date Taking? Authorizing Provider  metoCLOPramide (REGLAN) 10 MG tablet Take 1 tablet (10 mg total) by mouth every 6 (six) hours as needed for nausea. 05/02/19   Amayra Kiedrowski, Delice Bison, DO  pantoprazole (PROTONIX) 40 MG tablet Take 1 tablet (40 mg total) by mouth daily. 11/09/18 12/09/18  Antonieta Pert, MD  potassium chloride (KLOR-CON) 10 MEQ tablet Take 2  tablets (20 mEq total) by mouth daily for 3 days. 05/08/19 05/11/19  Deliah Boston, PA-C  promethazine (PHENERGAN) 25 MG suppository Place 1 suppository (25 mg total) rectally every 6 (six) hours as needed for nausea or vomiting. 05/03/19   Ripley Fraise, MD    ALLERGIES:  Allergies  Allergen Reactions  . Penicillins Rash    Has patient had a PCN reaction causing immediate rash, facial/tongue/throat swelling, SOB or lightheadedness with hypotension: yes Has patient had a PCN reaction causing severe rash involving mucus membranes or skin necrosis: no Has patient had a PCN reaction that required hospitalization no Has patient had a PCN reaction occurring within the last 10 years: yes If all of the above answers are "NO", then may proceed with Cephalosporin use.     SOCIAL HISTORY:  Social History   Tobacco Use  . Smoking status: Former Research scientist (life sciences)  . Smokeless tobacco: Never Used  Substance Use Topics  . Alcohol use: Yes    Comment: occasional     FAMILY HISTORY: Family History  Problem Relation Age of Onset  . Asthma Maternal Grandmother   . Hypertension Maternal Grandmother   . Diabetes Maternal Grandmother   . Hypertension Paternal Grandfather   . Diabetes Paternal Grandfather     EXAM: BP 114/72 (BP Location: Left Arm)   Pulse 89   Temp 97.9 F (36.6 C) (Oral)   Resp 18   Ht 5\' 3"  (1.6 m)   Wt  54.4 kg   SpO2 100%   BMI 21.26 kg/m  CONSTITUTIONAL: Alert and oriented and responds appropriately to questions. Well-appearing; well-nourished HEAD: Normocephalic EYES: Conjunctivae clear, pupils appear equal, EOM appear intact ENT: normal nose; very dry appearing mucous membranes NECK: Supple, normal ROM CARD: RRR; S1 and S2 appreciated; no murmurs, no clicks, no rubs, no gallops RESP: Normal chest excursion without splinting or tachypnea; breath sounds clear and equal bilaterally; no wheezes, no rhonchi, no rales, no hypoxia or respiratory distress, speaking full  sentences ABD/GI: Normal bowel sounds; non-distended; soft, non-tender, no rebound, no guarding, no peritoneal signs, no hepatosplenomegaly BACK:  The back appears normal EXT: Normal ROM in all joints; no deformity noted, no edema; no cyanosis SKIN: Normal color for age and race; warm; no rash on exposed skin NEURO: Moves all extremities equally PSYCH: The patient's mood and manner are appropriate.   MEDICAL DECISION MAKING: Patient here with vomiting and diarrhea.  Suspect exacerbation of her cyclic vomiting versus viral gastroenteritis.  Abdominal exam is benign.  Low suspicion for appendicitis, diverticulitis, colitis, perforation, cholecystitis, pancreatitis.  Will obtain labs, urine.  She has received Haldol 2mg  IV which she reports has controlled her symptoms.  Will give IV fluids as she does appear dry on exam.  Will encourage oral fluids as well.  If work-up unremarkable and patient is feeling better and tolerating p.o., anticipate discharge home.  ED PROGRESS: 11:53 PM  Pt vomiting again.  Will give second dose of IV Haldol 2mg  given first worked well for over an hour and it has worked for her in the past.  Labs currently in process.   1:11 AM  Pt's urine shows no infection but does show large ketones suggestive of dehydration.  Will give second liter of IV fluids.  She did have another episode of dry heaving.  Nurse reports quickly fell back asleep.  We will continue to monitor and attempt p.o. challenge once more awake.  2:30 AM  Pt very drowsy but arousable to voice.  Falls asleep quickly on reexamination.  She continues to have dry heaving.  Will attempt p.o. challenge.  Will give dose of IV Reglan.  She is requesting admission for continued symptoms.  3:00 AM  Pt had another 300 mL of yellow appearing emesis here after p.o. challenge of Sprite.  She has received 2 doses of Haldol, IV Reglan and 2 L of IV fluids and continues to vomit after multiple p.o. challenges.  Will give IV  Phenergan and admit for intractable vomiting.  She reports she does not have a PCP currently.  3:17 AM Discussed patient's case with hospitalist, Dr. Marice Potter.  I have recommended admission and patient (and family if present) agree with this plan. Admitting physician will place admission orders.   I reviewed all nursing notes, vitals, pertinent previous records and interpreted all EKGs, lab and urine results, imaging (as available).   CRITICAL CARE Performed by: Pryor Curia   Total critical care time: 45 minutes  Critical care time was exclusive of separately billable procedures and treating other patients.  Critical care was necessary to treat or prevent imminent or life-threatening deterioration.  Critical care was time spent personally by me on the following activities: development of treatment plan with patient and/or surrogate as well as nursing, discussions with consultants, evaluation of patient's response to treatment, examination of patient, obtaining history from patient or surrogate, ordering and performing treatments and interventions, ordering and review of laboratory studies, ordering and review of radiographic studies, pulse  oximetry and re-evaluation of patient's condition.   Debbie Hughes was evaluated in Emergency Department on 08/07/2019 for the symptoms described in the history of present illness. She was evaluated in the context of the global COVID-19 pandemic, which necessitated consideration that the patient might be at risk for infection with the SARS-CoV-2 virus that causes COVID-19. Institutional protocols and algorithms that pertain to the evaluation of patients at risk for COVID-19 are in a state of rapid change based on information released by regulatory bodies including the CDC and federal and state organizations. These policies and algorithms were followed during the patient's care in the ED.      Haille Pardi, Delice Bison, DO 08/08/19 [phone]

## 2019-08-08 ENCOUNTER — Encounter (HOSPITAL_BASED_OUTPATIENT_CLINIC_OR_DEPARTMENT_OTHER): Payer: Self-pay | Admitting: Family Medicine

## 2019-08-08 DIAGNOSIS — D72828 Other elevated white blood cell count: Secondary | ICD-10-CM | POA: Diagnosis present

## 2019-08-08 DIAGNOSIS — R1115 Cyclical vomiting syndrome unrelated to migraine: Secondary | ICD-10-CM | POA: Diagnosis present

## 2019-08-08 DIAGNOSIS — D72829 Elevated white blood cell count, unspecified: Secondary | ICD-10-CM | POA: Diagnosis not present

## 2019-08-08 DIAGNOSIS — E876 Hypokalemia: Secondary | ICD-10-CM | POA: Diagnosis present

## 2019-08-08 DIAGNOSIS — Z79899 Other long term (current) drug therapy: Secondary | ICD-10-CM | POA: Diagnosis not present

## 2019-08-08 DIAGNOSIS — Z20822 Contact with and (suspected) exposure to covid-19: Secondary | ICD-10-CM | POA: Diagnosis present

## 2019-08-08 DIAGNOSIS — E860 Dehydration: Secondary | ICD-10-CM | POA: Diagnosis present

## 2019-08-08 DIAGNOSIS — Z87891 Personal history of nicotine dependence: Secondary | ICD-10-CM | POA: Diagnosis not present

## 2019-08-08 DIAGNOSIS — Z880 Allergy status to penicillin: Secondary | ICD-10-CM | POA: Diagnosis not present

## 2019-08-08 DIAGNOSIS — F1220 Cannabis dependence, uncomplicated: Secondary | ICD-10-CM | POA: Diagnosis present

## 2019-08-08 LAB — COMPREHENSIVE METABOLIC PANEL
ALT: 20 U/L (ref 0–44)
AST: 22 U/L (ref 15–41)
Albumin: 4.3 g/dL (ref 3.5–5.0)
Alkaline Phosphatase: 55 U/L (ref 38–126)
Anion gap: 9 (ref 5–15)
BUN: 15 mg/dL (ref 6–20)
CO2: 24 mmol/L (ref 22–32)
Calcium: 9.1 mg/dL (ref 8.9–10.3)
Chloride: 106 mmol/L (ref 98–111)
Creatinine, Ser: 0.63 mg/dL (ref 0.44–1.00)
GFR calc Af Amer: >60 mL/min (ref >60)
GFR calc non Af Amer: >60 mL/min (ref >60)
Glucose, Bld: 133 mg/dL — ABNORMAL HIGH (ref 70–99)
Potassium: 3.6 mmol/L (ref 3.5–5.1)
Sodium: 139 mmol/L (ref 135–145)
Total Bilirubin: 0.8 mg/dL (ref 0.3–1.2)
Total Protein: 7.3 g/dL (ref 6.5–8.1)

## 2019-08-08 LAB — CBC WITH DIFFERENTIAL/PLATELET
Abs Immature Granulocytes: 0.06 10*3/uL (ref 0.00–0.07)
Basophils Absolute: 0 10*3/uL (ref 0.0–0.1)
Basophils Relative: 0 %
Eosinophils Absolute: 0 10*3/uL (ref 0.0–0.5)
Eosinophils Relative: 0 %
HCT: 39.6 % (ref 36.0–46.0)
Hemoglobin: 13.1 g/dL (ref 12.0–15.0)
Immature Granulocytes: 0 %
Lymphocytes Relative: 4 %
Lymphs Abs: 0.5 10*3/uL — ABNORMAL LOW (ref 0.7–4.0)
MCH: 32.5 pg (ref 26.0–34.0)
MCHC: 33.1 g/dL (ref 30.0–36.0)
MCV: 98.3 fL (ref 80.0–100.0)
Monocytes Absolute: 0.2 10*3/uL (ref 0.1–1.0)
Monocytes Relative: 1 %
Neutro Abs: 13.9 10*3/uL — ABNORMAL HIGH (ref 1.7–7.7)
Neutrophils Relative %: 95 %
Platelets: 194 10*3/uL (ref 150–400)
RBC: 4.03 MIL/uL (ref 3.87–5.11)
RDW: 11.7 % (ref 11.5–15.5)
WBC: 14.6 10*3/uL — ABNORMAL HIGH (ref 4.0–10.5)
nRBC: 0 % (ref 0.0–0.2)

## 2019-08-08 LAB — RAPID URINE DRUG SCREEN, HOSP PERFORMED
Amphetamines: NOT DETECTED
Barbiturates: NOT DETECTED
Benzodiazepines: NOT DETECTED
Cocaine: NOT DETECTED
Opiates: NOT DETECTED
Tetrahydrocannabinol: POSITIVE — AB

## 2019-08-08 LAB — URINALYSIS, ROUTINE W REFLEX MICROSCOPIC
Bilirubin Urine: NEGATIVE
Glucose, UA: NEGATIVE mg/dL
Hgb urine dipstick: NEGATIVE
Ketones, ur: >80 mg/dL — AB
Leukocytes,Ua: NEGATIVE
Nitrite: NEGATIVE
Protein, ur: NEGATIVE mg/dL
Specific Gravity, Urine: 1.02 (ref 1.005–1.030)
pH: 8.5 — ABNORMAL HIGH (ref 5.0–8.0)

## 2019-08-08 LAB — RENAL FUNCTION PANEL
Albumin: 4.3 g/dL (ref 3.5–5.0)
Anion gap: 12 (ref 5–15)
BUN: 8 mg/dL (ref 6–20)
CO2: 21 mmol/L — ABNORMAL LOW (ref 22–32)
Calcium: 8.9 mg/dL (ref 8.9–10.3)
Chloride: 104 mmol/L (ref 98–111)
Creatinine, Ser: 0.52 mg/dL (ref 0.44–1.00)
GFR calc Af Amer: >60 mL/min (ref >60)
GFR calc non Af Amer: >60 mL/min (ref >60)
Glucose, Bld: 118 mg/dL — ABNORMAL HIGH (ref 70–99)
Phosphorus: 3.1 mg/dL (ref 2.5–4.6)
Potassium: 3.6 mmol/L (ref 3.5–5.1)
Sodium: 137 mmol/L (ref 135–145)

## 2019-08-08 LAB — HEMOGLOBIN A1C
Hgb A1c MFr Bld: 5.4 % (ref 4.8–5.6)
Mean Plasma Glucose: 108.28 mg/dL

## 2019-08-08 LAB — MAGNESIUM: Magnesium: 1.7 mg/dL (ref 1.7–2.4)

## 2019-08-08 LAB — PREGNANCY, URINE: Preg Test, Ur: NEGATIVE

## 2019-08-08 LAB — LIPASE, BLOOD: Lipase: 28 U/L (ref 11–51)

## 2019-08-08 LAB — SARS CORONAVIRUS 2 (TAT 6-24 HRS): SARS Coronavirus 2: NEGATIVE

## 2019-08-08 MED ORDER — PROMETHAZINE HCL 25 MG/ML IJ SOLN
25.0000 mg | Freq: Once | INTRAMUSCULAR | Status: DC
  Filled 2019-08-08: qty 1

## 2019-08-08 MED ORDER — ONDANSETRON HCL 4 MG/2ML IJ SOLN
4.0000 mg | Freq: Four times a day (QID) | INTRAMUSCULAR | Status: DC | PRN
  Administered 2019-08-08 – 2019-08-09 (×4): 4 mg via INTRAVENOUS
  Filled 2019-08-08 (×4): qty 2

## 2019-08-08 MED ORDER — SODIUM CHLORIDE 0.9 % IV SOLN: Freq: Once | INTRAVENOUS | Status: AC

## 2019-08-08 MED ORDER — POTASSIUM CHLORIDE 10 MEQ/100ML IV SOLN
10.0000 meq | INTRAVENOUS | Status: AC
  Administered 2019-08-08 (×4): 10 meq via INTRAVENOUS
  Filled 2019-08-08 (×4): qty 100

## 2019-08-08 MED ORDER — MAGNESIUM SULFATE 2 GM/50ML IV SOLN
2.0000 g | Freq: Once | INTRAVENOUS | Status: AC
  Administered 2019-08-08: 2 g via INTRAVENOUS
  Filled 2019-08-08: qty 50

## 2019-08-08 MED ORDER — SODIUM CHLORIDE 0.9 % IV SOLN: INTRAVENOUS | Status: AC

## 2019-08-08 MED ORDER — METOCLOPRAMIDE HCL 5 MG/ML IJ SOLN: 10.0000 mg | Freq: Once | INTRAMUSCULAR | Status: DC

## 2019-08-08 MED ORDER — MAGNESIUM SULFATE 50 % IJ SOLN: 2.0000 g | Freq: Once | INTRAMUSCULAR | Status: DC

## 2019-08-08 MED ORDER — LORAZEPAM 0.5 MG PO TABS
0.2500 mg | ORAL_TABLET | Freq: Two times a day (BID) | ORAL | Status: AC
  Administered 2019-08-08 – 2019-08-09 (×3): 0.25 mg via ORAL
  Filled 2019-08-08 (×3): qty 1

## 2019-08-08 MED ORDER — ONDANSETRON HCL 4 MG/2ML IJ SOLN
4.0000 mg | Freq: Once | INTRAMUSCULAR | Status: AC
  Administered 2019-08-08: 4 mg via INTRAVENOUS
  Filled 2019-08-08: qty 2

## 2019-08-08 MED ORDER — PANTOPRAZOLE SODIUM 40 MG IV SOLR
40.0000 mg | INTRAVENOUS | Status: DC
  Administered 2019-08-08 – 2019-08-09 (×2): 40 mg via INTRAVENOUS
  Filled 2019-08-08 (×2): qty 40

## 2019-08-08 MED ORDER — SODIUM CHLORIDE 0.9 % IV BOLUS (SEPSIS)
1000.0000 mL | Freq: Once | INTRAVENOUS | Status: AC
  Administered 2019-08-08: 1000 mL via INTRAVENOUS

## 2019-08-08 MED ORDER — PANTOPRAZOLE SODIUM 40 MG PO TBEC
40.0000 mg | DELAYED_RELEASE_TABLET | Freq: Every day | ORAL | Status: DC
  Filled 2019-08-08: qty 1

## 2019-08-08 MED ORDER — METOCLOPRAMIDE HCL 5 MG/ML IJ SOLN
10.0000 mg | Freq: Once | INTRAMUSCULAR | Status: AC
  Administered 2019-08-08: 10 mg via INTRAVENOUS
  Filled 2019-08-08: qty 2

## 2019-08-08 NOTE — ED Notes (Signed)
Placed in sitting position sipping on a sprite.

## 2019-08-08 NOTE — ED Notes (Signed)
Report to [facility] 

## 2019-08-08 NOTE — ED Notes (Signed)
[facility] notified (Tammy) - patient is ready for transport 

## 2019-08-08 NOTE — ED Notes (Signed)
Report called to the floor.

## 2019-08-08 NOTE — H&P (Signed)
[facility] History and Physical  [name] [id number] DOB: 16-Dec-1990 DOA: 08/07/2019  Referring EDP: Ward, DO ([place]) PCP: [name], PA-C   Chief Complaint: Nausea/Vomiting   HPI: Debbie Hughes is a 29 y.o. female with PMH of cyclic vomiting syndrome presented to [facility] ED with nausea/vomiting and transferred for admission due to intractable nausea/vomiting.   Patient reports nausea and vomiting starting at 3pm yesterday afternoon. Reports symptoms like this about twice weekly since November 2020. Vomit has been non-bloody, non-bilious. Reports minimal PO intake x2 days with mild diarrhea. Reports her children have had a "stomach bug" as well. Patient states she has had a lot of tests done over the last several months and no cause for her nausea/vomiting has been found. Abdominal pain is located "all over" without any specific location. Non-radiating. Reports that Haldol has helped some and she has been taking Reglan at home. Reports that nausea/vomiting is also improved by eating small meals. Patient denies any drug use. Denies headache, dizziness, fever, chills, cough, SOB, chest pain, constipation, dysuria, hematuria, hematochezia, melena, difficulty moving arms/legs, speech difficulty, confusion or any other complaints.  In the outside ED: Mild tachycardia otherwise vitals WNL. Labs remarkable for: WBC 17.3, glucose 133 otherwise CBC/CMP WNL. Lipase WNL. UA without evidence of infection. UDS with THC. EDP gave Haldol, IVF, Reglan, Phenergan. Reports she had patient drink small amount of Sprite which she vomited up about 30 seconds later. Admission requested for intractable nausea/vomiting and failed PO challenge.   Review of Systems:  All other systems negative unless noted above in HPI.   Past Medical History:  Diagnosis Date  . Breast mass in female 01/21/06  . Breast nodule 04/26/10   Left  . BV (bacterial vaginosis) 5.10/11  .  Candida vaginitis 04/26/10  . Cyclical vomiting   . Ectopic pregnancy 03/24/2013  . Fibroadenoma   . H/O varicella   . History of chlamydia   . Irregular menstrual cycle 07/28/07  . 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 Vaginal discharge 10/01/07  . Vaginitis    Past Surgical History:  Procedure Laterality Date  . BREAST CYST EXCISION Left 2007  . DILATION AND CURETTAGE OF UTERUS    . LAPAROSCOPY N/A 03/24/2013   Procedure: LAPAROSCOPY OPERATIVE;  Surgeon: Betsy Coder, MD;  Location: [facility];  Service: Gynecology;  Laterality: N/A;  . Left Salpingectomy  03/24/2013   Ectopic Pregnancy  . UNILATERAL SALPINGECTOMY Left 03/24/2013   Procedure: UNILATERAL SALPINGECTOMY;  Surgeon: Betsy Coder, MD;  Location: [facility];  Service: Gynecology;  Laterality: Left;   Social History:  reports that she has quit smoking. She has never used smokeless tobacco. She reports current alcohol use.  Drug: Marijuana.  Allergies  Allergen Reactions  . Penicillins Rash    Has patient had a PCN reaction causing immediate rash, facial/tongue/throat swelling, SOB or lightheadedness with hypotension: yes Has patient had a PCN reaction causing severe rash involving mucus membranes or skin necrosis: no Has patient had a PCN reaction that required hospitalization no Has patient had a PCN reaction occurring within the last 10 years: yes If all of the above answers are "NO", then may proceed with Cephalosporin use.     Family History  Problem Relation Age of Onset  . Asthma Maternal Grandmother   . Hypertension Maternal Grandmother   . Diabetes Maternal Grandmother   . Hypertension Paternal Grandfather   . Diabetes Paternal Grandfather  Prior to Admission medications   Medication Sig Start Date End Date Taking? Authorizing Provider  metoCLOPramide (REGLAN) 10 MG tablet Take 1 tablet (10 mg total) by mouth every 6 (six) hours as needed for nausea. 05/02/19   Ward,  Delice Bison, DO  pantoprazole (PROTONIX) 40 MG tablet Take 1 tablet (40 mg total) by mouth daily. 11/09/18 12/09/18  Antonieta Pert, MD  potassium chloride (KLOR-CON) 10 MEQ tablet Take 2 tablets (20 mEq total) by mouth daily for 3 days. 05/08/19 05/11/19  Deliah Boston, PA-C  promethazine (PHENERGAN) 25 MG suppository Place 1 suppository (25 mg total) rectally every 6 (six) hours as needed for nausea or vomiting. 05/03/19   Ripley Fraise, MD   Physical Exam: Vitals:   08/08/19 0345 08/08/19 0416 08/08/19 0537 08/08/19 0539  BP:  126/78  119/78  Pulse: 79 79  83  Resp:  18    Temp:  98.8 F (37.1 C)  98.7 F (37.1 C)  TempSrc:  Oral  Oral  SpO2: 100% 100%  100%  Weight:   53.4 kg   Height:   5\' 3"  (1.6 m)     Wt Readings from Last 3 Encounters:  08/08/19 53.4 kg  05/08/19 51.8 kg  05/02/19 48.1 kg    . General:  Resting in bed. Drowsy and continually falling asleep during interview. Oriented x3. . Eyes: EOMI, normal lids, irises & conjunctiva . ENT: grossly normal hearing, lips & tongue . Neck: normal ROM . Cardiovascular: RRR, no m/r/g. No LE edema. Marland Kitchen Respiratory: CTA bilaterally, no w/r/r. Normal respiratory effort. . Abdomen: generalized, non-localized TTP; no rebound or guarding  . Skin: no rash or induration seen on limited exam . Musculoskeletal: grossly normal tone BUE/BLE . Psychiatric: grossly normal mood and affect, speech fluent and appropriate . Neurologic: grossly non-focal.          Labs on Admission:  Basic Metabolic Panel: Recent Labs  Lab 08/07/19 2340  NA 139  K 3.6  CL 106  CO2 24  GLUCOSE 133*  BUN 15  CREATININE 0.63  CALCIUM 9.1   Liver Function Tests: Recent Labs  Lab 08/07/19 2340  AST 22  ALT 20  ALKPHOS 55  BILITOT 0.8  PROT 7.3  ALBUMIN 4.3   Recent Labs  Lab 08/07/19 2340  LIPASE 28   No results for input(s): AMMONIA in the last 168 hours. CBC: Recent Labs  Lab 08/07/19 2340  WBC 17.3*  NEUTROABS 16.0*  HGB 13.2  HCT  39.7  MCV 99.3  PLT 197   Cardiac Enzymes: No results for input(s): CKTOTAL, CKMB, CKMBINDEX, TROPONINI in the last 168 hours.  BNP (last 3 results) No results for input(s): BNP in the last 8760 hours.  ProBNP (last 3 results) No results for input(s): PROBNP in the last 8760 hours.  CBG: No results for input(s): GLUCAP in the last 168 hours.  Radiological Exams on Admission: No results found.  EKG: None  Assessment/Plan Principal Problem:   Cyclic vomiting syndrome  Nausea/Vomiting/Abdominal Pain - likely multifactorial: THC use, possible gastroenteritis - improved with Haldol but patient very drowsy right now, will hold further doses at this time - Cont PPI and Reglan  - ADAT  - Cont mIVF for next 8 hours - HIDA scan normal in Jan 2021 - A1c previously normal 2018, will recheck in case component of gastroparesis - Abd US unremarkable in Aug 2020 - Multiple CT Abd/Pelv scans most recently Oct 2020; all without acute findings to explain symptoms  Code Status: Full  DVT Prophylaxis: None; early ambulation Family Communication: None Disposition Plan: Admit under observation. Currently unable to tolerate PO. Can likely discharge home today or tomorrow.   Time spent: 45 minutes   Chauncey Mann, MD [facility] Pager [phone]

## 2019-08-08 NOTE — Plan of Care (Signed)
  Problem: Clinical Measurements: Goal: Will remain free from infection Outcome: Progressing Goal: Cardiovascular complication will be avoided Outcome: Progressing   Problem: Activity: Goal: Risk for activity intolerance will decrease Outcome: Progressing   Problem: Coping: Goal: Level of anxiety will decrease Outcome: Progressing   Problem: Elimination: Goal: Will not experience complications related to bowel motility Outcome: Progressing   Problem: Pain Managment: Goal: General experience of comfort will improve Outcome: Progressing   Problem: Safety: Goal: Ability to remain free from injury will improve Outcome: Progressing

## 2019-08-08 NOTE — ED Notes (Signed)
[facility] here to transport pt. No vomiting noted.

## 2019-08-08 NOTE — Progress Notes (Signed)
PROGRESS NOTE    Debbie Hughes  [id number] DOB: 1991-04-03 DOA: 08/07/2019 PCP: [name], PA-C  Outpatient Specialists:   Brief Narrative:  Debbie Hughes is a 29 y.o. female with PMH of cyclic vomiting syndrome presented to [facility] ED with nausea/vomiting and transferred for admission due to intractable nausea/vomiting.  UDS is positive for tetrahydrocannabinol.  Assessment & Plan:   Principal Problem:   Cyclic vomiting syndrome  Nausea/Vomiting/Abdominal Pain/cyclical vomiting syndrome/cannabis use -Supportive care.   -Counseled to quit cannabis use.   -IV Zofran as needed.   -Lorazepam 0.25 mg p.o. twice daily x4 doses.   -Continue IV fluid. - HIDA scan normal in Jan 2021 - Multiple CT Abd/Pelv scans most recently Oct 2020; all without acute findings to explain symptoms  -Supportive care. -Monitor renal function and electrolytes.  Leukocytosis: -Likely secondary to nausea vomiting. -Resolving.   DVT prophylaxis: None.   Code Status: Full code. Family Communication:  Disposition Plan: Home eventually.   Consultants:   None  Procedures:   None  Antimicrobials:   None   Subjective: Continues to have nausea and vomiting.  Objective: Vitals:   08/08/19 0416 08/08/19 0537 08/08/19 0539 08/08/19 1300  BP: 126/78  119/78 100/80  Pulse: 79  83 84  Resp: 18   18  Temp: 98.8 F (37.1 C)  98.7 F (37.1 C) 98 F (36.7 C)  TempSrc: Oral  Oral Oral  SpO2: 100%  100% 99%  Weight:  53.4 kg    Height:  5\' 3"  (1.6 m)      Intake/Output Summary (Last 24 hours) at 08/08/2019 1805 Last data filed at 08/08/2019 0930 Gross per 24 hour  Intake 1202.57 ml  Output 800 ml  Net 402.57 ml   Filed Weights   08/07/19 2143 08/08/19 0537  Weight: 54.4 kg 53.4 kg    Examination:  General exam: Appears calm and comfortable  Respiratory system: Clear to auscultation. Respiratory effort normal. Cardiovascular system: S1 & S2  heard Gastrointestinal system: Abdomen is nondistended, soft and nontender. No organomegaly or masses felt. Normal bowel sounds heard. Central nervous system: Alert and oriented. No focal neurological deficits. Extremities: No leg edema.  Data Reviewed: I have personally reviewed following labs and imaging studies  CBC: Recent Labs  Lab 08/07/19 2340 08/08/19 0957  WBC 17.3* 14.6*  NEUTROABS 16.0* 13.9*  HGB 13.2 13.1  HCT 39.7 39.6  MCV 99.3 98.3  PLT 197 [id number]   Basic Metabolic Panel: Recent Labs  Lab 08/07/19 2340 08/08/19 0957  NA 139 137  K 3.6 3.6  CL 106 104  CO2 24 21*  GLUCOSE 133* 118*  BUN 15 8  CREATININE 0.63 0.52  CALCIUM 9.1 8.9  MG  --  1.7  PHOS  --  3.1   GFR: Estimated Creatinine Clearance: 85.8 mL/min (by C-G formula based on SCr of 0.52 mg/dL). Liver Function Tests: Recent Labs  Lab 08/07/19 2340 08/08/19 0957  AST 22  --   ALT 20  --   ALKPHOS 55  --   BILITOT 0.8  --   PROT 7.3  --   ALBUMIN 4.3 4.3   Recent Labs  Lab 08/07/19 2340  LIPASE 28   No results for input(s): AMMONIA in the last 168 hours. Coagulation Profile: No results for input(s): INR, PROTIME in the last 168 hours. Cardiac Enzymes: No results for input(s): CKTOTAL, CKMB, CKMBINDEX, TROPONINI in the last 168 hours. BNP (last 3 results) No results for input(s): PROBNP in  the last 8760 hours. HbA1C: Recent Labs    08/08/19 0628  HGBA1C 5.4   CBG: No results for input(s): GLUCAP in the last 168 hours. Lipid Profile: No results for input(s): CHOL, HDL, LDLCALC, TRIG, CHOLHDL, LDLDIRECT in the last 72 hours. Thyroid Function Tests: No results for input(s): TSH, T4TOTAL, FREET4, T3FREE, THYROIDAB in the last 72 hours. Anemia Panel: No results for input(s): VITAMINB12, FOLATE, FERRITIN, TIBC, IRON, RETICCTPCT in the last 72 hours. Urine analysis:    Component Value Date/Time   COLORURINE YELLOW 08/08/2019 0043   APPEARANCEUR CLEAR 08/08/2019 0043   LABSPEC  1.020 08/08/2019 0043   PHURINE 8.5 (H) 08/08/2019 0043   GLUCOSEU NEGATIVE 08/08/2019 0043   HGBUR NEGATIVE 08/08/2019 0043   BILIRUBINUR NEGATIVE 08/08/2019 0043   KETONESUR >80 (A) 08/08/2019 0043   PROTEINUR NEGATIVE 08/08/2019 0043   UROBILINOGEN 0.2 09/21/2013 1930   NITRITE NEGATIVE 08/08/2019 0043   LEUKOCYTESUR NEGATIVE 08/08/2019 0043   Sepsis Labs: @LABRCNTIP (procalcitonin:4,lacticidven:4)  )No results found for this or any previous visit (from the past 240 hour(s)).       Radiology Studies: No results found.      Scheduled Meds: . LORazepam  0.25 mg Oral BID  . pantoprazole (PROTONIX) IV  40 mg Intravenous Q24H   Continuous Infusions: . sodium chloride 100 mL/hr at 08/08/19 1523  . potassium chloride 10 mEq (08/08/19 1725)     LOS: 0 days    Time spent: 35 minutes.   Dana Allan, MD  [facility] Pager #: [phone] 7PM-7AM contact night coverage as above

## 2019-08-08 NOTE — Progress Notes (Signed)
MD on call paged about patient's Covid result. Waiting for orders to d/c precautions.  Boykin Reaper RN BSN

## 2019-08-08 NOTE — ED Notes (Signed)
In with pt. Very drowsy on exam. Updated MD due to phenergan IV being ordered.

## 2019-08-09 ENCOUNTER — Inpatient Hospital Stay (HOSPITAL_COMMUNITY): Payer: BC Managed Care – PPO

## 2019-08-09 LAB — CBC WITH DIFFERENTIAL/PLATELET
Abs Immature Granulocytes: 0.39 10*3/uL — ABNORMAL HIGH (ref 0.00–0.07)
Basophils Absolute: 0.1 10*3/uL (ref 0.0–0.1)
Basophils Relative: 2 %
Eosinophils Absolute: 0.1 10*3/uL (ref 0.0–0.5)
Eosinophils Relative: 3 %
HCT: 33.4 % — ABNORMAL LOW (ref 36.0–46.0)
Hemoglobin: 10.3 g/dL — ABNORMAL LOW (ref 12.0–15.0)
Immature Granulocytes: 9 %
Lymphocytes Relative: 21 %
Lymphs Abs: 0.9 10*3/uL (ref 0.7–4.0)
MCH: 28.7 pg (ref 26.0–34.0)
MCHC: 30.8 g/dL (ref 30.0–36.0)
MCV: 93 fL (ref 80.0–100.0)
Monocytes Absolute: 0.4 10*3/uL (ref 0.1–1.0)
Monocytes Relative: 9 %
Neutro Abs: 2.5 10*3/uL (ref 1.7–7.7)
Neutrophils Relative %: 56 %
Platelets: 81 10*3/uL — ABNORMAL LOW (ref 150–400)
RBC: 3.59 MIL/uL — ABNORMAL LOW (ref 3.87–5.11)
RDW: 17.4 % — ABNORMAL HIGH (ref 11.5–15.5)
WBC: 4.4 10*3/uL (ref 4.0–10.5)
nRBC: 10.8 % — ABNORMAL HIGH (ref 0.0–0.2)

## 2019-08-09 LAB — COMPREHENSIVE METABOLIC PANEL
ALT: 105 U/L — ABNORMAL HIGH (ref 0–44)
AST: 72 U/L — ABNORMAL HIGH (ref 15–41)
Albumin: 3.7 g/dL (ref 3.5–5.0)
Alkaline Phosphatase: 107 U/L (ref 38–126)
Anion gap: 8 (ref 5–15)
BUN: 18 mg/dL (ref 6–20)
CO2: 26 mmol/L (ref 22–32)
Calcium: 8.7 mg/dL — ABNORMAL LOW (ref 8.9–10.3)
Chloride: 103 mmol/L (ref 98–111)
Creatinine, Ser: 1 mg/dL (ref 0.44–1.00)
GFR calc Af Amer: >60 mL/min (ref >60)
GFR calc non Af Amer: >60 mL/min (ref >60)
Glucose, Bld: 212 mg/dL — ABNORMAL HIGH (ref 70–99)
Potassium: 4.3 mmol/L (ref 3.5–5.1)
Sodium: 137 mmol/L (ref 135–145)
Total Bilirubin: 2.7 mg/dL — ABNORMAL HIGH (ref 0.3–1.2)
Total Protein: 6.2 g/dL — ABNORMAL LOW (ref 6.5–8.1)

## 2019-08-09 LAB — MAGNESIUM: Magnesium: 2 mg/dL (ref 1.7–2.4)

## 2019-08-09 LAB — RENAL FUNCTION PANEL
Albumin: 3.9 g/dL (ref 3.5–5.0)
Anion gap: 7 (ref 5–15)
BUN: 10 mg/dL (ref 6–20)
CO2: 23 mmol/L (ref 22–32)
Calcium: 9 mg/dL (ref 8.9–10.3)
Chloride: 108 mmol/L (ref 98–111)
Creatinine, Ser: 0.56 mg/dL (ref 0.44–1.00)
GFR calc Af Amer: >60 mL/min (ref >60)
GFR calc non Af Amer: >60 mL/min (ref >60)
Glucose, Bld: 110 mg/dL — ABNORMAL HIGH (ref 70–99)
Phosphorus: 2.2 mg/dL — ABNORMAL LOW (ref 2.5–4.6)
Potassium: 4 mmol/L (ref 3.5–5.1)
Sodium: 138 mmol/L (ref 135–145)

## 2019-08-09 LAB — PROCALCITONIN: Procalcitonin: 0.16 ng/mL

## 2019-08-09 LAB — LACTIC ACID, PLASMA: Lactic Acid, Venous: 1.1 mmol/L (ref 0.5–1.9)

## 2019-08-09 MED ORDER — PANTOPRAZOLE SODIUM 40 MG IV SOLR
40.0000 mg | Freq: Two times a day (BID) | INTRAVENOUS | Status: DC
  Administered 2019-08-09 – 2019-08-11 (×4): 40 mg via INTRAVENOUS
  Filled 2019-08-09 (×4): qty 40

## 2019-08-09 MED ORDER — PROMETHAZINE HCL 25 MG/ML IJ SOLN
12.5000 mg | INTRAMUSCULAR | Status: DC | PRN
  Administered 2019-08-09 – 2019-08-10 (×7): 12.5 mg via INTRAVENOUS
  Filled 2019-08-09 (×8): qty 1

## 2019-08-09 NOTE — Progress Notes (Deleted)
All discharge informations including meds and care orders have been reported to pt's Nurse, Mauricio Po at [facility]. Patient's peripheral IV removed, tube feeding stopped and clamped, proper trachea tube oxygenation in placed  and pt's belongings as well as discharge papers in envelope sent with [facility].

## 2019-08-09 NOTE — Progress Notes (Signed)
PROGRESS NOTE  Debbie Hughes [id number] DOB: December 26, 1990 DOA: 08/07/2019 PCP: [name], PA-C  HPI/Recap of past 24 hours: Debbie Hughes is a 29 y.o. female with PMH of cyclic vomiting syndrome presented to [facility] ED with nausea and vomiting of 1 day.  Transferred to [facility] for further evaluation.   Reports her children have had a "stomach bug" as well. Patient states she has had a lot of tests done over the last several months and no cause for her nausea/vomiting has been found. Abdominal pain is located "all over" without any specific location. Non-radiating. Reports that Haldol has helped some and she has been taking Reglan at home. Reports that nausea/vomiting is also improved by eating small meals.   WBC 17.3, LFTs, Tbili, UA, A1C, Lipase WNL. UDS + THC.  TRH asked to admit for intractable nausea and vomiting with unclear causes.  08/09/19:  Persistent nausea and vomiting this AM.  Added IV phenergan PRN.   Assessment/Plan: Principal Problem:   Cyclic vomiting syndrome   Nausea/Vomiting/Abdominal Pain - likely multifactorial: THC use, possible gastroenteritis -Increase IV PPI to twice a day Continue benzo due to concern for cannabalis induced nausea Continue IV phenergan as needed for intractable nausea Continue IV fluid Encourage po intake as tolerated Advance diet as tolerated - HIDA scan normal in Jan 2021 A1C, LFTs, Tbili, UA, Lipase, preg test WNL. - Abd US unremarkable in Aug 2020 - Multiple CT Abd/Pelv scans most recently Oct 2020; all without acute findings to explain symptoms   Cannabalis hyperemesis syndrome suspected Management per above  Leukocytosis Unclear if reactive or infective related Suspect from gastroenteritis Continue IV fluid and supportive care Tmax 99.3 Repeat CBC w diff, lactic and procalcitonin in PM  Hypomagnesemia Mag 1.7 Mag replaced  THC abuse/Vapping States started vapping 3 weeks ago to help her quit tobacco  use O2 sats 100% RA THC cessation counseling   Code Status: Full  DVT Prophylaxis: None; early ambulation Family Communication: will call family if ok with the patient Disposition Plan: Patient is from home.  Anticipate dc to home in the next 24-48H once can tolerate po and symptomatology has improved.  Barrier to dc, persistent N&V this AM, poor oral intake.      Objective: Vitals:   08/08/19 0539 08/08/19 1300 08/08/19 2040 08/09/19 0618  BP: 119/78 100/80 118/76 (!) 130/94  Pulse: 83 84 82 75  Resp:  18  20  Temp: 98.7 F (37.1 C) 98 F (36.7 C) 98 F (36.7 C) 99.3 F (37.4 C)  TempSrc: Oral Oral Oral Oral  SpO2: 100% 99% 99% 100%  Weight:      Height:        Intake/Output Summary (Last 24 hours) at 08/09/2019 1148 Last data filed at 08/09/2019 1000 Gross per 24 hour  Intake 1846.52 ml  Output 350 ml  Net 1496.52 ml   Filed Weights   08/07/19 2143 08/08/19 0537  Weight: 54.4 kg 53.4 kg    Exam:  . General: 29 y.o. year-old female well developed well nourished in no acute distress.  Alert and oriented x3. . Cardiovascular: Regular rate and rhythm with no rubs or gallops.  No thyromegaly or JVD noted.   Marland Kitchen Respiratory: Clear to auscultation with no wheezes or rales. Good inspiratory effort. . Abdomen: Soft tender at epigastric region.  Nondistended with normal bowel sounds present . Musculoskeletal: No lower extremity edema. 2/4 pulses in all 4 extremities. Marland Kitchen Psychiatry: Mood is appropriate for condition and  setting   Data Reviewed: CBC: Recent Labs  Lab 08/07/19 2340 08/08/19 0957  WBC 17.3* 14.6*  NEUTROABS 16.0* 13.9*  HGB 13.2 13.1  HCT 39.7 39.6  MCV 99.3 98.3  PLT 197 [id number]   Basic Metabolic Panel: Recent Labs  Lab 08/07/19 2340 08/08/19 0957 08/09/19 0438  NA 139 137 138  K 3.6 3.6 4.0  CL 106 104 108  CO2 24 21* 23  GLUCOSE 133* 118* 110*  BUN 15 8 10   CREATININE 0.63 0.52 0.56  CALCIUM 9.1 8.9 9.0  MG  --  1.7 2.0  PHOS  --  3.1 2.2*    GFR: Estimated Creatinine Clearance: 85.8 mL/min (by C-G formula based on SCr of 0.56 mg/dL). Liver Function Tests: Recent Labs  Lab 08/07/19 2340 08/08/19 0957 08/09/19 0438  AST 22  --   --   ALT 20  --   --   ALKPHOS 55  --   --   BILITOT 0.8  --   --   PROT 7.3  --   --   ALBUMIN 4.3 4.3 3.9   Recent Labs  Lab 08/07/19 2340  LIPASE 28   No results for input(s): AMMONIA in the last 168 hours. Coagulation Profile: No results for input(s): INR, PROTIME in the last 168 hours. Cardiac Enzymes: No results for input(s): CKTOTAL, CKMB, CKMBINDEX, TROPONINI in the last 168 hours. BNP (last 3 results) No results for input(s): PROBNP in the last 8760 hours. HbA1C: Recent Labs    08/08/19 0628  HGBA1C 5.4   CBG: No results for input(s): GLUCAP in the last 168 hours. Lipid Profile: No results for input(s): CHOL, HDL, LDLCALC, TRIG, CHOLHDL, LDLDIRECT in the last 72 hours. Thyroid Function Tests: No results for input(s): TSH, T4TOTAL, FREET4, T3FREE, THYROIDAB in the last 72 hours. Anemia Panel: No results for input(s): VITAMINB12, FOLATE, FERRITIN, TIBC, IRON, RETICCTPCT in the last 72 hours. Urine analysis:    Component Value Date/Time   COLORURINE YELLOW 08/08/2019 [place] 08/08/2019 0043   LABSPEC 1.020 08/08/2019 0043   PHURINE 8.5 (H) 08/08/2019 0043   GLUCOSEU NEGATIVE 08/08/2019 0043   HGBUR NEGATIVE 08/08/2019 0043   BILIRUBINUR NEGATIVE 08/08/2019 0043   KETONESUR >80 (A) 08/08/2019 0043   PROTEINUR NEGATIVE 08/08/2019 0043   UROBILINOGEN 0.2 09/21/2013 1930   NITRITE NEGATIVE 08/08/2019 0043   LEUKOCYTESUR NEGATIVE 08/08/2019 0043   Sepsis Labs: @LABRCNTIP (procalcitonin:4,lacticidven:4)  ) Recent Results (from the past 240 hour(s))  SARS CORONAVIRUS 2 (TAT 6-24 HRS) Nasopharyngeal Nasopharyngeal Swab     Status: None   Collection Time: 08/08/19  3:05 AM   Specimen: Nasopharyngeal Swab  Result Value Ref Range Status   SARS  Coronavirus 2 NEGATIVE NEGATIVE Final    Comment: (NOTE) SARS-CoV-2 target nucleic acids are NOT DETECTED. The SARS-CoV-2 RNA is generally detectable in upper and lower respiratory specimens during the acute phase of infection. Negative results do not preclude SARS-CoV-2 infection, do not rule out co-infections with other pathogens, and should not be used as the sole basis for treatment or other patient management decisions. Negative results must be combined with clinical observations, patient history, and epidemiological information. The expected result is Negative. Fact Sheet for Patients: SugarRoll.be Fact Sheet for Healthcare Providers: https://www.woods-mathews.com/ This test is not yet approved or cleared by the [place] FDA and  has been authorized for detection and/or diagnosis of SARS-CoV-2 by FDA under an Emergency Use Authorization (EUA). This EUA will remain  in effect (meaning this test  can be used) for the duration of the COVID-19 declaration under Section 56 4(b)(1) of the Act, 21 U.S.C. section 360bbb-3(b)(1), unless the authorization is terminated or revoked sooner. Performed at [facility], [place] [address] [place], [place] [address]       Studies: No results found.  Scheduled Meds: . LORazepam  0.25 mg Oral BID  . pantoprazole (PROTONIX) IV  40 mg Intravenous Q24H    Continuous Infusions: . sodium chloride 100 mL/hr at 08/09/19 1102     LOS: 1 day     Kayleen Memos, MD [facility] Pager [phone]  If 7PM-7AM, please contact night-coverage [email or web address] Password [facility] 08/09/2019, 11:48 AM

## 2019-08-09 NOTE — Progress Notes (Signed)
Precautions discontinued per order

## 2019-08-10 ENCOUNTER — Inpatient Hospital Stay (HOSPITAL_COMMUNITY): Payer: BC Managed Care – PPO

## 2019-08-10 LAB — CBC WITH DIFFERENTIAL/PLATELET
Abs Immature Granulocytes: 0.03 10*3/uL (ref 0.00–0.07)
Basophils Absolute: 0 10*3/uL (ref 0.0–0.1)
Basophils Relative: 0 %
Eosinophils Absolute: 0 10*3/uL (ref 0.0–0.5)
Eosinophils Relative: 0 %
HCT: 38.2 % (ref 36.0–46.0)
Hemoglobin: 13 g/dL (ref 12.0–15.0)
Immature Granulocytes: 0 %
Lymphocytes Relative: 18 %
Lymphs Abs: 1.6 10*3/uL (ref 0.7–4.0)
MCH: 32.9 pg (ref 26.0–34.0)
MCHC: 34 g/dL (ref 30.0–36.0)
MCV: 96.7 fL (ref 80.0–100.0)
Monocytes Absolute: 0.5 10*3/uL (ref 0.1–1.0)
Monocytes Relative: 5 %
Neutro Abs: 7.1 10*3/uL (ref 1.7–7.7)
Neutrophils Relative %: 77 %
Platelets: 186 10*3/uL (ref 150–400)
RBC: 3.95 MIL/uL (ref 3.87–5.11)
RDW: 11.3 % — ABNORMAL LOW (ref 11.5–15.5)
WBC: 9.2 10*3/uL (ref 4.0–10.5)
nRBC: 0 % (ref 0.0–0.2)

## 2019-08-10 LAB — COMPREHENSIVE METABOLIC PANEL
ALT: 16 U/L (ref 0–44)
AST: 15 U/L (ref 15–41)
Albumin: 4.1 g/dL (ref 3.5–5.0)
Alkaline Phosphatase: 55 U/L (ref 38–126)
Anion gap: 11 (ref 5–15)
BUN: 14 mg/dL (ref 6–20)
CO2: 23 mmol/L (ref 22–32)
Calcium: 9.1 mg/dL (ref 8.9–10.3)
Chloride: 102 mmol/L (ref 98–111)
Creatinine, Ser: 0.6 mg/dL (ref 0.44–1.00)
GFR calc Af Amer: >60 mL/min (ref >60)
GFR calc non Af Amer: >60 mL/min (ref >60)
Glucose, Bld: 93 mg/dL (ref 70–99)
Potassium: 3.4 mmol/L — ABNORMAL LOW (ref 3.5–5.1)
Sodium: 136 mmol/L (ref 135–145)
Total Bilirubin: 0.8 mg/dL (ref 0.3–1.2)
Total Protein: 7.1 g/dL (ref 6.5–8.1)

## 2019-08-10 LAB — HEPATITIS PANEL, ACUTE
HCV Ab: NONREACTIVE
Hep A IgM: NONREACTIVE
Hep B C IgM: NONREACTIVE
Hepatitis B Surface Ag: NONREACTIVE

## 2019-08-10 LAB — LIPASE, BLOOD: Lipase: 33 U/L (ref 11–51)

## 2019-08-10 MED ORDER — POTASSIUM CHLORIDE 2 MEQ/ML IV SOLN
INTRAVENOUS | Status: DC
  Filled 2019-08-10 (×2): qty 1000

## 2019-08-10 MED ORDER — KCL-LACTATED RINGERS-D5W 20 MEQ/L IV SOLN
INTRAVENOUS | Status: DC
  Filled 2019-08-10 (×2): qty 1000

## 2019-08-10 NOTE — Progress Notes (Signed)
PROGRESS NOTE  Debbie Hughes [id number] DOB: 1991/04/10 DOA: 08/07/2019 PCP: [name], PA-C  HPI/Recap of past 24 hours: Debbie Hughes is a 29 y.o. female with PMH of cyclic vomiting syndrome presented to [facility] ED with nausea and vomiting x 1 day.  Transferred to [facility] for further evaluation.   Reports her children have had a "stomach bug" as well. Patient states she has had a lot of tests done over the last several months and no cause for her nausea/vomiting has been found. Abdominal pain is located "all over" without any specific location. Non-radiating. Reports that Haldol has helped some and she has been taking Reglan at home. Reports that nausea/vomiting is also improved by eating small meals.   WBC 17.3, UDS + THC.  TRH asked to admit for intractable nausea and vomiting with unclear causes.  Vomited >12 times on 4/5 despite IV antiemetics.  And again this AM > 4 times.  GI consulted.  Plan is to get gastric emptying scan to r/o gastroparesis.   08/10/19:  Persistent nausea and vomiting this AM.  on IV phenergan PRN for intractable N&V. On IV PPI BID. Seen by [facility].   Assessment/Plan: Principal Problem:   Cyclic vomiting syndrome   Nausea/Vomiting/Abdominal Pain - likely multifactorial: THC use, possible gastroparesis -Continue IV PPI Protonix 40 mg twice a day Continue IV phenergan as needed for intractable nausea Continue IV fluid LR D5 KCl at 75 cc/h x 2 days Encourage po intake as tolerated Advance diet as tolerated - HIDA scan normal in Jan 2021 A1C, LFTs, Tbili, UA, Lipase, preg test WNL. Right upper quadrant ultrasound unremarkable - Abd US unremarkable in Aug 2020  - Multiple CT Abd/Pelv scans most recently Oct 2020; all without acute findings to explain symptoms  Gastric emptying scan ordered by GI, follow results Care directed by GI  Cannabalis hyperemesis syndrome suspected Management per above  Resolved leukocytosis likely  reactive Lactic acid, procalcitonin negative  Resolved post repletion: Hypomagnesemia Mag 1.7>> 2.0.  Hypokalemia Potassium 3.4 Repleted with IV fluids as stated above Repeat BMP in the morning  THC abuse/Vapping States started vapping 3 weeks ago to help her quit tobacco use O2 sats 100% RA Independently reviewed chest x-ray no infiltrates noted, clear lungs  THC cessation counseling   Code Status: Full  DVT Prophylaxis: None; early ambulation Family Communication: will call family if ok with the patient Disposition Plan: Patient is from home.  Anticipate dc to home in the next 48H once can tolerate po and symptomatology has improved.  Barrier to dc, persistent N&V.     Objective: Vitals:   08/09/19 1359 08/09/19 2023 08/10/19 0534 08/10/19 1215  BP: (!) 133/92 (!) 138/93 (!) 134/96 122/80  Pulse: 76 80 62 68  Resp: 18 16 16 16   Temp: 99 F (37.2 C) 98.3 F (36.8 C) 98.8 F (37.1 C) 98.9 F (37.2 C)  TempSrc: Oral Oral Oral Oral  SpO2: 99% 100% 100% 97%  Weight:      Height:       No intake or output data in the 24 hours ending 08/10/19 1810 Filed Weights   08/07/19 2143 08/08/19 0537  Weight: 54.4 kg 53.4 kg    Exam:  . General: 29 y.o. year-old female well-nourished uncomfortable due to nausea.  Alert and oriented x3. . Cardiovascular: Regular rate and rhythm no rubs or gallops.   Marland Kitchen Respiratory: Clear to auscultation with no wheezes or rales.   . Abdomen: Soft nontender  nondistended normal bowel sounds present.   . Musculoskeletal: No lower extremity edema bilaterally.   Marland Kitchen Psychiatry: Mood is appropriate for condition and setting.  Data Reviewed: CBC: Recent Labs  Lab 08/07/19 2340 08/08/19 0957 08/09/19 1705 08/10/19 0442  WBC 17.3* 14.6* 4.4 9.2  NEUTROABS 16.0* 13.9* 2.5 7.1  HGB 13.2 13.1 10.3* 13.0  HCT 39.7 39.6 33.4* 38.2  MCV 99.3 98.3 93.0 96.7  PLT 197 194 81* [id number]   Basic Metabolic Panel: Recent Labs  Lab 08/07/19 2340  08/08/19 0957 08/09/19 0438 08/09/19 1705 08/10/19 0442  NA 139 137 138 137 136  K 3.6 3.6 4.0 4.3 3.4*  CL 106 104 108 103 102  CO2 24 21* 23 26 23   GLUCOSE 133* 118* 110* 212* 93  BUN 15 8 10 18 14   CREATININE 0.63 0.52 0.56 1.00 0.60  CALCIUM 9.1 8.9 9.0 8.7* 9.1  MG  --  1.7 2.0  --   --   PHOS  --  3.1 2.2*  --   --    GFR: Estimated Creatinine Clearance: 85.8 mL/min (by C-G formula based on SCr of 0.6 mg/dL). Liver Function Tests: Recent Labs  Lab 08/07/19 2340 08/08/19 0957 08/09/19 0438 08/09/19 1705 08/10/19 0442  AST 22  --   --  72* 15  ALT 20  --   --  105* 16  ALKPHOS 55  --   --  107 55  BILITOT 0.8  --   --  2.7* 0.8  PROT 7.3  --   --  6.2* 7.1  ALBUMIN 4.3 4.3 3.9 3.7 4.1   Recent Labs  Lab 08/07/19 2340 08/10/19 0442  LIPASE 28 33   No results for input(s): AMMONIA in the last 168 hours. Coagulation Profile: No results for input(s): INR, PROTIME in the last 168 hours. Cardiac Enzymes: No results for input(s): CKTOTAL, CKMB, CKMBINDEX, TROPONINI in the last 168 hours. BNP (last 3 results) No results for input(s): PROBNP in the last 8760 hours. HbA1C: Recent Labs    08/08/19 0628  HGBA1C 5.4   CBG: No results for input(s): GLUCAP in the last 168 hours. Lipid Profile: No results for input(s): CHOL, HDL, LDLCALC, TRIG, CHOLHDL, LDLDIRECT in the last 72 hours. Thyroid Function Tests: No results for input(s): TSH, T4TOTAL, FREET4, T3FREE, THYROIDAB in the last 72 hours. Anemia Panel: No results for input(s): VITAMINB12, FOLATE, FERRITIN, TIBC, IRON, RETICCTPCT in the last 72 hours. Urine analysis:    Component Value Date/Time   COLORURINE YELLOW 08/08/2019 [place] 08/08/2019 0043   LABSPEC 1.020 08/08/2019 0043   PHURINE 8.5 (H) 08/08/2019 0043   GLUCOSEU NEGATIVE 08/08/2019 0043   HGBUR NEGATIVE 08/08/2019 0043   BILIRUBINUR NEGATIVE 08/08/2019 0043   KETONESUR >80 (A) 08/08/2019 0043   PROTEINUR NEGATIVE 08/08/2019  0043   UROBILINOGEN 0.2 09/21/2013 1930   NITRITE NEGATIVE 08/08/2019 0043   LEUKOCYTESUR NEGATIVE 08/08/2019 0043   Sepsis Labs: @LABRCNTIP (procalcitonin:4,lacticidven:4)  ) Recent Results (from the past 240 hour(s))  SARS CORONAVIRUS 2 (TAT 6-24 HRS) Nasopharyngeal Nasopharyngeal Swab     Status: None   Collection Time: 08/08/19  3:05 AM   Specimen: Nasopharyngeal Swab  Result Value Ref Range Status   SARS Coronavirus 2 NEGATIVE NEGATIVE Final    Comment: (NOTE) SARS-CoV-2 target nucleic acids are NOT DETECTED. The SARS-CoV-2 RNA is generally detectable in upper and lower respiratory specimens during the acute phase of infection. Negative results do not preclude SARS-CoV-2 infection, do not rule out co-infections  with other pathogens, and should not be used as the sole basis for treatment or other patient management decisions. Negative results must be combined with clinical observations, patient history, and epidemiological information. The expected result is Negative. Fact Sheet for Patients: SugarRoll.be Fact Sheet for Healthcare Providers: https://www.woods-mathews.com/ This test is not yet approved or cleared by the [place] FDA and  has been authorized for detection and/or diagnosis of SARS-CoV-2 by FDA under an Emergency Use Authorization (EUA). This EUA will remain  in effect (meaning this test can be used) for the duration of the COVID-19 declaration under Section 56 4(b)(1) of the Act, 21 U.S.C. section 360bbb-3(b)(1), unless the authorization is terminated or revoked sooner. Performed at [facility], [place] [address], [place], [place] [address]       Studies: US Abdomen Limited RUQ  Result Date: 08/10/2019 CLINICAL DATA:  Abnormal LFTs. EXAM: ULTRASOUND ABDOMEN LIMITED RIGHT UPPER QUADRANT COMPARISON:  CT 02/23/2019. FINDINGS: Gallbladder: No gallstones or wall thickening visualized. No sonographic Murphy sign  noted by sonographer. Common bile duct: Diameter: 1.2 mm Liver: No focal lesion identified. Within normal limits in parenchymal echogenicity. Portal vein is patent on color Doppler imaging with normal direction of blood flow towards the liver. Other: None. IMPRESSION: No acute or focal abnormality identified. No gallstones or biliary distention. Electronically Signed   By: Marcello Moores  Register   On: 08/10/2019 08:42    Scheduled Meds: . pantoprazole (PROTONIX) IV  40 mg Intravenous Q12H    Continuous Infusions:    LOS: 2 days     Kayleen Memos, MD [facility] Pager [phone]  If 7PM-7AM, please contact night-coverage [email or web address] Password [email or web address] 08/10/2019, 6:10 PM

## 2019-08-10 NOTE — Progress Notes (Signed)
Received call from [facility] re gastric emptying study. Advised tech that pt has not tolerated clear liquids and has vomited 4x so far today. Advised Md and she started soft diet. Patient vomited mashed potatoes. Md notified. Eulas Post, RN

## 2019-08-10 NOTE — Consult Note (Addendum)
Referring Provider: Dr. Irene Pap Primary Care Physician:  [name], PA-C Primary Gastroenterologist: [facility]  Reason for Consultation: Nausea and vomiting  HPI: Debbie Hughes is a 29 y.o. female with history of cyclic vomiting presenting with nausea and vomiting.  Patient reports intractable nausea and vomiting that started last week.  She was vomiting up to 12 times per day.  She is not currently eating or drinking due to nausea.  She also had an episode of diarrhea on Saturday 4/3 and states she had several family members who had presumed viral gastroenteritis.  She has not had a bowel movement since Saturday.    Patient states she has had intermittent nausea and vomiting since July 2020.  She states it occurs monthly to every other month.  She takes Phenergan as needed at home.  She denies taking Reglan recently.  She denies any dysphagia, hematemesis, melena, hematochezia.  She has had some reflux sensation with recent episodes of vomiting.  Other than the episode of diarrhea on Saturday, she has not had any changes in stool.  She reports mild epigastric tenderness but otherwise denies abdominal pain.  She denies changes in appetite or unexplained weight loss.  She states that in between nausea/vomiting episodes, she has a completely normal appetite.  She denies any family history of gastrointestinal malignancies.  Patient denies current marijuana use but states she has a vape with CBD oil.  She states she has not used marijuana since October or November; however, urine THC positive upon admission.  EGD 12/25/2018 was normal; no repeat recommended.  HIDA scan 05/14/19 was within normal limits.  Past Medical History:  Diagnosis Date  . Breast mass in female 01/21/06  . Breast nodule 04/26/10   Left  . BV (bacterial vaginosis) 5.10/11  . Candida vaginitis 04/26/10  . Cyclical vomiting   . Ectopic pregnancy 03/24/2013  . Fibroadenoma   . H/O varicella   . History of  chlamydia   . Irregular menstrual cycle 07/28/07  . 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 Vaginal discharge 10/01/07  . Vaginitis     Past Surgical History:  Procedure Laterality Date  . BREAST CYST EXCISION Left 2007  . DILATION AND CURETTAGE OF UTERUS    . LAPAROSCOPY N/A 03/24/2013   Procedure: LAPAROSCOPY OPERATIVE;  Surgeon: Betsy Coder, MD;  Location: [facility];  Service: Gynecology;  Laterality: N/A;  . Left Salpingectomy  03/24/2013   Ectopic Pregnancy  . UNILATERAL SALPINGECTOMY Left 03/24/2013   Procedure: UNILATERAL SALPINGECTOMY;  Surgeon: Betsy Coder, MD;  Location: [facility];  Service: Gynecology;  Laterality: Left;    Prior to Admission medications   Medication Sig Start Date End Date Taking? Authorizing Provider  levonorgestrel (MIRENA, 52 MG,) 20 MCG/24HR IUD 1 Intra Uterine Device by Intrauterine route once.   Yes [provider]  pantoprazole (PROTONIX) 40 MG tablet Take 1 tablet (40 mg total) by mouth daily. 11/09/18 08/08/19 Yes Antonieta Pert, MD  metoCLOPramide (REGLAN) 10 MG tablet Take 1 tablet (10 mg total) by mouth every 6 (six) hours as needed for nausea. Patient not taking: Reported on 08/08/2019 05/02/19   Ward, Delice Bison, DO  potassium chloride (KLOR-CON) 10 MEQ tablet Take 2 tablets (20 mEq total) by mouth daily for 3 days. Patient not taking: Reported on 08/08/2019 05/08/19 05/11/19  Deliah Boston, PA-C  promethazine (PHENERGAN) 25 MG suppository Place 1 suppository (25 mg total) rectally every 6 (six) hours as needed for  nausea or vomiting. Patient not taking: Reported on 08/08/2019 05/03/19   Ripley Fraise, MD    Scheduled Meds: . pantoprazole (PROTONIX) IV  40 mg Intravenous Q12H   Continuous Infusions: PRN Meds:.ondansetron (ZOFRAN) IV, promethazine  Allergies as of 08/07/2019 - Review Complete 08/07/2019  Allergen Reaction Noted  . Penicillins Rash 01/21/2006    Family History  Problem Relation Age of  Onset  . Asthma Maternal Grandmother   . Hypertension Maternal Grandmother   . Diabetes Maternal Grandmother   . Hypertension Paternal Grandfather   . Diabetes Paternal Grandfather     Social History   Socioeconomic History  . Marital status: Single    Spouse name: Not on file  . Number of children: Not on file  . Years of education: Not on file  . Highest education level: Not on file  Occupational History  . Not on file  Tobacco Use  . Smoking status: Former Research scientist (life sciences)  . Smokeless tobacco: Never Used  Substance and Sexual Activity  . Alcohol use: Yes    Comment: occasional   . Drug use: Not on file    Comment: states she has not smoked marijuana in 2 months  . Sexual activity: Yes  Other Topics Concern  . Not on file  Social History Narrative  . Not on file   Social Determinants of Health   Financial Resource Strain:   . Difficulty of Paying Living Expenses:   Food Insecurity: No Food Insecurity  . Worried About Charity fundraiser in the Last Year: Never true  . Ran Out of Food in the Last Year: Never true  Transportation Needs: No Transportation Needs  . Lack of Transportation (Medical): No  . Lack of Transportation (Non-Medical): No  Physical Activity: Inactive  . Days of Exercise per Week: 0 days  . Minutes of Exercise per Session: 0 min  Stress: No Stress Concern Present  . Feeling of Stress : Not at all  Social Connections: Unknown  . Frequency of Communication with Friends and Family: Three times a week  . Frequency of Social Gatherings with Friends and Family: Three times a week  . Attends Religious Services: Never  . Active Member of Clubs or Organizations: No  . Attends Archivist Meetings: Never  . Marital Status: Not asked  Intimate Partner Violence:   . Fear of Current or Ex-Partner:   . Emotionally Abused:   Marland Kitchen Physically Abused:   . Sexually Abused:     Review of Systems: Review of Systems  Constitutional: Negative for fever and  weight loss.  HENT: Negative for hearing loss and tinnitus.   Eyes: Negative for blurred vision and pain.  Respiratory: Negative for cough and shortness of breath.   Cardiovascular: Negative for chest pain and palpitations.  Gastrointestinal: Positive for abdominal pain, diarrhea, heartburn, nausea and vomiting. Negative for blood in stool, constipation and melena.  Genitourinary: Negative for dysuria and frequency.  Musculoskeletal: Negative for joint pain and myalgias.  Skin: Negative for itching and rash.  Neurological: Negative for seizures and loss of consciousness.  Endo/Heme/Allergies: Negative for environmental allergies. Does not bruise/bleed easily.  Psychiatric/Behavioral: Positive for substance abuse. The patient is not nervous/anxious.     Physical Exam: Vital signs: Vitals:   08/09/19 2023 08/10/19 0534  BP: (!) 138/93 (!) 134/96  Pulse: 80 62  Resp: 16 16  Temp: 98.3 F (36.8 C) 98.8 F (37.1 C)  SpO2: 100% 100%   Last BM Date: 08/07/19  Physical Exam  Constitutional: She is oriented to person, place, and time. No distress.  thin  HENT:  Head: Normocephalic and atraumatic.  Eyes: Conjunctivae and EOM are normal.  Cardiovascular: Normal rate, regular rhythm and normal heart sounds.  Pulmonary/Chest: Effort normal and breath sounds normal. No respiratory distress.  Abdominal: Soft. Bowel sounds are normal. She exhibits no distension and no mass. There is abdominal tenderness (mild, epigastric). There is no rebound and no guarding.  Musculoskeletal:        General: No deformity or edema. Normal range of motion.     Cervical back: Normal range of motion and neck supple.  Neurological: She is alert and oriented to person, place, and time.  Skin: Skin is warm and dry.  Psychiatric: She has a normal mood and affect. Her behavior is normal.  Vitals reviewed.   GI:  Lab Results: Recent Labs    08/08/19 0957 08/09/19 1705 08/10/19 0442  WBC 14.6* 4.4 9.2  HGB  13.1 10.3* 13.0  HCT 39.6 33.4* 38.2  PLT 194 81* 186   BMET Recent Labs    08/09/19 0438 08/09/19 1705 08/10/19 0442  NA 138 137 136  K 4.0 4.3 3.4*  CL 108 103 102  CO2 23 26 23   GLUCOSE 110* 212* 93  BUN 10 18 14   CREATININE 0.56 1.00 0.60  CALCIUM 9.0 8.7* 9.1   LFT Recent Labs    08/10/19 0442  PROT 7.1  ALBUMIN 4.1  AST 15  ALT 16  ALKPHOS 55  BILITOT 0.8   PT/INR No results for input(s): LABPROT, INR in the last 72 hours.   Studies/Results: DG CHEST PORT 1 VIEW  Result Date: 08/09/2019 CLINICAL DATA:  Leukocytosis. EXAM: PORTABLE CHEST 1 VIEW COMPARISON:  May 29, 2016. FINDINGS: The heart size and mediastinal contours are within normal limits. Both lungs are clear. No pneumothorax or pleural effusion is noted. Continue dextroscoliosis of thoracic spine. IMPRESSION: No acute cardiopulmonary abnormality seen. Electronically Signed   By: Marijo Conception M.D.   On: 08/09/2019 16:57   US Abdomen Limited RUQ  Result Date: 08/10/2019 CLINICAL DATA:  Abnormal LFTs. EXAM: ULTRASOUND ABDOMEN LIMITED RIGHT UPPER QUADRANT COMPARISON:  CT 02/23/2019. FINDINGS: Gallbladder: No gallstones or wall thickening visualized. No sonographic Murphy sign noted by sonographer. Common bile duct: Diameter: 1.2 mm Liver: No focal lesion identified. Within normal limits in parenchymal echogenicity. Portal vein is patent on color Doppler imaging with normal direction of blood flow towards the liver. Other: None. IMPRESSION: No acute or focal abnormality identified. No gallstones or biliary distention. Electronically Signed   By: [place]   On: 08/10/2019 08:42    Impression: -Cyclic nausea and vomiting.  Most likely THC usage (+UDS), though patient denies recent or daily usage.  Patient may have had a recent viral gastroenteritis though has not had any episodes of diarrhea since Saturday 4/3. -Transient elevations in LFTs, now normalized.  Acute hepatitis panel  negative.  Plan: -Recommended trial of Reglan, 5mg  TID IV. Side effects of medication, including tremors/Tardive dyskinesia, discussed with patient.  Patient would prefer to proceed with gastric emptying study prior to initiation of Reglan. Gastric emptying study ordered. -Continue IV fluids -Continue antiemetics PRN -Continue IV PPI BID -Advance diet as tolerated  [facility] will follow.   LOS: 2 days   Salley Slaughter  PA-C 08/10/2019, 10:53 AM  Contact #  [phone]

## 2019-08-11 ENCOUNTER — Inpatient Hospital Stay (HOSPITAL_COMMUNITY): Payer: BC Managed Care – PPO

## 2019-08-11 ENCOUNTER — Ambulatory Visit (HOSPITAL_COMMUNITY): Payer: BC Managed Care – PPO

## 2019-08-11 DIAGNOSIS — F1220 Cannabis dependence, uncomplicated: Secondary | ICD-10-CM

## 2019-08-11 DIAGNOSIS — E876 Hypokalemia: Secondary | ICD-10-CM

## 2019-08-11 DIAGNOSIS — D72829 Elevated white blood cell count, unspecified: Secondary | ICD-10-CM

## 2019-08-11 LAB — COMPREHENSIVE METABOLIC PANEL
ALT: 14 U/L (ref 0–44)
AST: 16 U/L (ref 15–41)
Albumin: 4.2 g/dL (ref 3.5–5.0)
Alkaline Phosphatase: 54 U/L (ref 38–126)
Anion gap: 12 (ref 5–15)
BUN: 17 mg/dL (ref 6–20)
CO2: 25 mmol/L (ref 22–32)
Calcium: 9.1 mg/dL (ref 8.9–10.3)
Chloride: 100 mmol/L (ref 98–111)
Creatinine, Ser: 0.6 mg/dL (ref 0.44–1.00)
GFR calc Af Amer: >60 mL/min (ref >60)
GFR calc non Af Amer: >60 mL/min (ref >60)
Glucose, Bld: 102 mg/dL — ABNORMAL HIGH (ref 70–99)
Potassium: 3.4 mmol/L — ABNORMAL LOW (ref 3.5–5.1)
Sodium: 137 mmol/L (ref 135–145)
Total Bilirubin: 0.8 mg/dL (ref 0.3–1.2)
Total Protein: 7.3 g/dL (ref 6.5–8.1)

## 2019-08-11 LAB — CBC WITH DIFFERENTIAL/PLATELET
Abs Immature Granulocytes: 0.02 10*3/uL (ref 0.00–0.07)
Basophils Absolute: 0 10*3/uL (ref 0.0–0.1)
Basophils Relative: 0 %
Eosinophils Absolute: 0 10*3/uL (ref 0.0–0.5)
Eosinophils Relative: 0 %
HCT: 42 % (ref 36.0–46.0)
Hemoglobin: 14.3 g/dL (ref 12.0–15.0)
Immature Granulocytes: 0 %
Lymphocytes Relative: 27 %
Lymphs Abs: 2.1 10*3/uL (ref 0.7–4.0)
MCH: 33 pg (ref 26.0–34.0)
MCHC: 34 g/dL (ref 30.0–36.0)
MCV: 97 fL (ref 80.0–100.0)
Monocytes Absolute: 0.5 10*3/uL (ref 0.1–1.0)
Monocytes Relative: 7 %
Neutro Abs: 5.1 10*3/uL (ref 1.7–7.7)
Neutrophils Relative %: 66 %
Platelets: 224 10*3/uL (ref 150–400)
RBC: 4.33 MIL/uL (ref 3.87–5.11)
RDW: 11.1 % — ABNORMAL LOW (ref 11.5–15.5)
WBC: 7.7 10*3/uL (ref 4.0–10.5)
nRBC: 0 % (ref 0.0–0.2)

## 2019-08-11 MED ORDER — TECHNETIUM TC 99M SULFUR COLLOID
2.2000 | Freq: Once | INTRAVENOUS | Status: AC | PRN
  Administered 2019-08-11: 2.2 via INTRAVENOUS

## 2019-08-11 MED ORDER — PROMETHAZINE HCL 50 MG PO TABS: 50.0000 mg | ORAL_TABLET | Freq: Four times a day (QID) | ORAL | 0 refills | Status: DC | PRN

## 2019-08-11 MED ORDER — POLYETHYLENE GLYCOL 3350 17 G PO PACK
17.0000 g | PACK | Freq: Every day | ORAL | Status: DC
  Administered 2019-08-11: 17 g via ORAL
  Filled 2019-08-11: qty 1

## 2019-08-11 NOTE — Discharge Summary (Addendum)
Physician Discharge Summary  TRACIE BROSE [id number] DOB: Dec 13, 1990 DOA: 08/07/2019  PCP: [name], PA-C  Admit date: 08/07/2019 Discharge date: 08/11/2019  Admitted From: Home  Disposition:  Home   Recommendations for Outpatient Follow-up and new medication changes:  1. Follow up with [name] PA C in 7 days.  2. Continue as needed promethazine for nausea 3. Avoid tetrahydrocannabinol use.    Home Health: no   Equipment/Devices: no    Discharge Condition: stable  CODE STATUS: full  Diet recommendation: soft diet and advance as tolerated.   Brief/Interim Summary: Patient was admitted to the hospital with intractable nausea and vomiting in the setting of tetrahydrocannabinol use.   29 year old female who presented with nausea and vomiting.  She does have significant past medical history for cyclic vomiting syndrome.  Patient reported sudden onset of nausea and vomiting, nonbloody nonbilious, associated with decreased p.o. intake and mild diarrhea.  Patient was transferred from [facility] to [facility] for further evaluation. At the time of transfer her blood pressure 126/78, heart rate 79, respiratory rate 18, temperature 98.8, oxygen saturation 98%, lungs were clear to auscultation bilaterally, heart S1-S2 present rhythmic, soft abdomen, no lower extremity edema.   Sodium 139, potassium 3.6, chloride 106, bicarb 24, glucose 133, BUN 15, creatinine 0.63, AST 22, ALT 20, white count 17.3, hemoglobin 13.2, hematocrit 39.7, platelets 197.  Drug screen positive for tetrahydrocannabinol.  SARS COVID-19 negative.  Chest radiograph negative for infiltrates.  Patient received supportive medical therapy including intravenous fluids, antiacids and as needed antiemetics.   Her symptoms slowly improved, will follow-up as an outpatient.  1.  Intractable nausea and vomiting, cyclic vomiting syndrome, tetrahydrocannabinol hyperemesis syndrome.  Patient responded well to  medical therapy, patient received antiacids, antiemetics, and intravenous fluids.  Her diet was advanced slowly with good toleration.  Gastroenterology was consulted, gastric emptying study has been ordered which result is pending.  By the time of discharge she has been eating well soft diet with no further nausea vomiting.  2.  Reactive leukocytosis.  No signs of infection, discharge white cell count 7.7.  3.  Hypokalemia and hypomagnesemia.  Related to significant vomiting.  Patient received potassium chloride and magnesium sulfate for correction of electrolytes.  4.  Tetrahydrocannabinol use.  She was advised about cessation.  She was explained that tetrahydrocannabinol use can trigger severe nausea and vomiting   Discharge Diagnoses:  Principal Problem:   Cyclic vomiting syndrome Active Problems:   Leukocytosis   Tetrahydrocannabinol (THC) dependence (HCC)    Discharge Instructions   Allergies as of 08/11/2019      Reactions   Penicillins Rash   Has patient had a PCN reaction causing immediate rash, facial/tongue/throat swelling, SOB or lightheadedness with hypotension: yes Has patient had a PCN reaction causing severe rash involving mucus membranes or skin necrosis: no Has patient had a PCN reaction that required hospitalization no Has patient had a PCN reaction occurring within the last 10 years: yes If all of the above answers are "NO", then may proceed with Cephalosporin use.      Medication List    STOP taking these medications   metoCLOPramide 10 MG tablet Commonly known as: REGLAN   Mirena (52 MG) 20 MCG/24HR IUD Generic drug: levonorgestrel   potassium chloride 10 MEQ tablet Commonly known as: KLOR-CON   promethazine 25 MG suppository Commonly known as: PHENERGAN Replaced by: promethazine 50 MG tablet     TAKE these medications   pantoprazole 40 MG tablet  Commonly known as: Protonix Take 1 tablet (40 mg total) by mouth daily.   promethazine 50 MG  tablet Commonly known as: PHENERGAN Take 1 tablet (50 mg total) by mouth every 6 (six) hours as needed for nausea or vomiting. Replaces: promethazine 25 MG suppository      Follow-up Information    [name], PA-C Follow up in 1 week(s).   Specialty: Family Medicine Contact information: [place] [address] [phone]          Allergies  Allergen Reactions  . Penicillins Rash    Has patient had a PCN reaction causing immediate rash, facial/tongue/throat swelling, SOB or lightheadedness with hypotension: yes Has patient had a PCN reaction causing severe rash involving mucus membranes or skin necrosis: no Has patient had a PCN reaction that required hospitalization no Has patient had a PCN reaction occurring within the last 10 years: yes If all of the above answers are "NO", then may proceed with Cephalosporin use.     Consultations:  GI    Procedures/Studies: DG CHEST PORT 1 VIEW  Result Date: 08/09/2019 CLINICAL DATA:  Leukocytosis. EXAM: PORTABLE CHEST 1 VIEW COMPARISON:  May 29, 2016. FINDINGS: The heart size and mediastinal contours are within normal limits. Both lungs are clear. No pneumothorax or pleural effusion is noted. Continue dextroscoliosis of thoracic spine. IMPRESSION: No acute cardiopulmonary abnormality seen. Electronically Signed   By: Marijo Conception M.D.   On: 08/09/2019 16:57   US Abdomen Limited RUQ  Result Date: 08/10/2019 CLINICAL DATA:  Abnormal LFTs. EXAM: ULTRASOUND ABDOMEN LIMITED RIGHT UPPER QUADRANT COMPARISON:  CT 02/23/2019. FINDINGS: Gallbladder: No gallstones or wall thickening visualized. No sonographic Murphy sign noted by sonographer. Common bile duct: Diameter: 1.2 mm Liver: No focal lesion identified. Within normal limits in parenchymal echogenicity. Portal vein is patent on color Doppler imaging with normal direction of blood flow towards the liver. Other: None. IMPRESSION: No acute or focal abnormality  identified. No gallstones or biliary distention. Electronically Signed   By: Marcello Moores  Register   On: 08/10/2019 08:42      Procedures:   Subjective: Patient is feeling well, no further nausea or vomiting and tolerating sift diet well, no dyspnea or chest pain.   Discharge Exam: Vitals:   08/10/19 2153 08/11/19 0506  BP: (!) 136/94 (!) 131/97  Pulse: 78 66  Resp: 18 18  Temp: 100.1 F (37.8 C) 98.8 F (37.1 C)  SpO2: 100% 100%   Vitals:   08/10/19 0534 08/10/19 1215 08/10/19 2153 08/11/19 0506  BP: (!) 134/96 122/80 (!) 136/94 (!) 131/97  Pulse: 62 68 78 66  Resp: 16 16 18 18   Temp: 98.8 F (37.1 C) 98.9 F (37.2 C) 100.1 F (37.8 C) 98.8 F (37.1 C)  TempSrc: Oral Oral Oral Oral  SpO2: 100% 97% 100% 100%  Weight:      Height:        General: Not in pain or dyspnea .  Neurology: Awake and alert, non focal  E ENT: no pallor, no icterus, oral mucosa moist Cardiovascular: No JVD. S1-S2 present, rhythmic, no gallops, rubs, or murmurs. No lower extremity edema. Pulmonary: positive breath sounds bilaterally, adequate air movement, no wheezing, rhonchi or rales. Gastrointestinal. Abdomen with no organomegaly, non tender, no rebound or guarding Skin. No rashes Musculoskeletal: no joint deformities   The results of significant diagnostics from this hospitalization (including imaging, microbiology, ancillary and laboratory) are listed below for reference.     Microbiology: Recent Results (from  the past 240 hour(s))  SARS CORONAVIRUS 2 (TAT 6-24 HRS) Nasopharyngeal Nasopharyngeal Swab     Status: None   Collection Time: 08/08/19  3:05 AM   Specimen: Nasopharyngeal Swab  Result Value Ref Range Status   SARS Coronavirus 2 NEGATIVE NEGATIVE Final    Comment: (NOTE) SARS-CoV-2 target nucleic acids are NOT DETECTED. The SARS-CoV-2 RNA is generally detectable in upper and lower respiratory specimens during the acute phase of infection. Negative results do not preclude  SARS-CoV-2 infection, do not rule out co-infections with other pathogens, and should not be used as the sole basis for treatment or other patient management decisions. Negative results must be combined with clinical observations, patient history, and epidemiological information. The expected result is Negative. Fact Sheet for Patients: SugarRoll.be Fact Sheet for Healthcare Providers: https://www.woods-mathews.com/ This test is not yet approved or cleared by the [place] FDA and  has been authorized for detection and/or diagnosis of SARS-CoV-2 by FDA under an Emergency Use Authorization (EUA). This EUA will remain  in effect (meaning this test can be used) for the duration of the COVID-19 declaration under Section 56 4(b)(1) of the Act, 21 U.S.C. section 360bbb-3(b)(1), unless the authorization is terminated or revoked sooner. Performed at [facility], [place] [address], [place], [place] [address]      Labs: BNP (last 3 results) No results for input(s): BNP in the last 8760 hours. Basic Metabolic Panel: Recent Labs  Lab 08/08/19 0957 08/09/19 0438 08/09/19 1705 08/10/19 0442 08/11/19 0411  NA 137 138 137 136 137  K 3.6 4.0 4.3 3.4* 3.4*  CL 104 108 103 102 100  CO2 21* 23 26 23 25   GLUCOSE 118* 110* 212* 93 102*  BUN 8 10 18 14 17   CREATININE 0.52 0.56 1.00 0.60 0.60  CALCIUM 8.9 9.0 8.7* 9.1 9.1  MG 1.7 2.0  --   --   --   PHOS 3.1 2.2*  --   --   --    Liver Function Tests: Recent Labs  Lab 08/07/19 2340 08/07/19 2340 08/08/19 0957 08/09/19 0438 08/09/19 1705 08/10/19 0442 08/11/19 0411  AST 22  --   --   --  72* 15 16  ALT 20  --   --   --  105* 16 14  ALKPHOS 55  --   --   --  107 55 54  BILITOT 0.8  --   --   --  2.7* 0.8 0.8  PROT 7.3  --   --   --  6.2* 7.1 7.3  ALBUMIN 4.3   < > 4.3 3.9 3.7 4.1 4.2   < > = values in this interval not displayed.   Recent Labs  Lab 08/07/19 2340 08/10/19 0442   LIPASE 28 33   No results for input(s): AMMONIA in the last 168 hours. CBC: Recent Labs  Lab 08/07/19 2340 08/08/19 0957 08/09/19 1705 08/10/19 0442 08/11/19 0411  WBC 17.3* 14.6* 4.4 9.2 7.7  NEUTROABS 16.0* 13.9* 2.5 7.1 5.1  HGB 13.2 13.1 10.3* 13.0 14.3  HCT 39.7 39.6 33.4* 38.2 42.0  MCV 99.3 98.3 93.0 96.7 97.0  PLT 197 194 81* 186 224   Cardiac Enzymes: No results for input(s): CKTOTAL, CKMB, CKMBINDEX, TROPONINI in the last 168 hours. BNP: Invalid input(s): POCBNP CBG: No results for input(s): GLUCAP in the last 168 hours. D-Dimer No results for input(s): DDIMER in the last 72 hours. Hgb A1c No results for input(s): HGBA1C in the last 72  hours. Lipid Profile No results for input(s): CHOL, HDL, LDLCALC, TRIG, CHOLHDL, LDLDIRECT in the last 72 hours. Thyroid function studies No results for input(s): TSH, T4TOTAL, T3FREE, THYROIDAB in the last 72 hours.  Invalid input(s): FREET3 Anemia work up No results for input(s): VITAMINB12, FOLATE, FERRITIN, TIBC, IRON, RETICCTPCT in the last 72 hours. Urinalysis    Component Value Date/Time   COLORURINE YELLOW 08/08/2019 0043   APPEARANCEUR CLEAR 08/08/2019 0043   LABSPEC 1.020 08/08/2019 0043   PHURINE 8.5 (H) 08/08/2019 0043   GLUCOSEU NEGATIVE 08/08/2019 0043   HGBUR NEGATIVE 08/08/2019 0043   BILIRUBINUR NEGATIVE 08/08/2019 0043   KETONESUR >80 (A) 08/08/2019 0043   PROTEINUR NEGATIVE 08/08/2019 0043   UROBILINOGEN 0.2 09/21/2013 1930   NITRITE NEGATIVE 08/08/2019 0043   LEUKOCYTESUR NEGATIVE 08/08/2019 0043   Sepsis Labs Invalid input(s): PROCALCITONIN,  WBC,  LACTICIDVEN Microbiology Recent Results (from the past 240 hour(s))  SARS CORONAVIRUS 2 (TAT 6-24 HRS) Nasopharyngeal Nasopharyngeal Swab     Status: None   Collection Time: 08/08/19  3:05 AM   Specimen: Nasopharyngeal Swab  Result Value Ref Range Status   SARS Coronavirus 2 NEGATIVE NEGATIVE Final    Comment: (NOTE) SARS-CoV-2 target nucleic  acids are NOT DETECTED. The SARS-CoV-2 RNA is generally detectable in upper and lower respiratory specimens during the acute phase of infection. Negative results do not preclude SARS-CoV-2 infection, do not rule out co-infections with other pathogens, and should not be used as the sole basis for treatment or other patient management decisions. Negative results must be combined with clinical observations, patient history, and epidemiological information. The expected result is Negative. Fact Sheet for Patients: SugarRoll.be Fact Sheet for Healthcare Providers: https://www.woods-mathews.com/ This test is not yet approved or cleared by the [place] FDA and  has been authorized for detection and/or diagnosis of SARS-CoV-2 by FDA under an Emergency Use Authorization (EUA). This EUA will remain  in effect (meaning this test can be used) for the duration of the COVID-19 declaration under Section 56 4(b)(1) of the Act, 21 U.S.C. section 360bbb-3(b)(1), unless the authorization is terminated or revoked sooner. Performed at [facility], [place] [address] [place], [place] [address]      Time coordinating discharge: 45 minutes  SIGNED:   Tawni Millers, MD  [facility] 08/11/2019, 10:18 AM

## 2019-08-11 NOTE — Progress Notes (Addendum)
[facility] Gastroenterology Progress Note  Debbie Hughes 29 y.o. 09/28/1990   Subjective: Debbie Hughes is a 29 y.o. female with history of cyclic vomiting presenting with nausea and vomiting.  She states she is feeling better and was able to tolerate some saltine crackers, applesauce, and water last night.  However, this morning, during gastric emptying study, she vomited the egg and was unable to complete the study.  Since that time, she is feeling less nauseated.  She has tolerated applesauce and water since that time.  She has not had a bowel movement since Saturday 4/3.  Objective: Vital signs: Vitals:   08/10/19 2153 08/11/19 0506  BP: (!) 136/94 (!) 131/97  Pulse: 78 66  Resp: 18 18  Temp: 100.1 F (37.8 C) 98.8 F (37.1 C)  SpO2: 100% 100%   Review of Systems  Constitutional: Negative for chills and fever.  Respiratory: Negative for shortness of breath.   Cardiovascular: Negative for chest pain.  Gastrointestinal: Positive for constipation, nausea and vomiting. Negative for abdominal pain and diarrhea.    Physical Exam: Gen: alert, no acute distress  HEENT: anicteric sclera CV: RRR Chest: CTA B Abd: soft, nontender, nondistended.  Normoactive bowel sounds.  No guarding or peritoneal signs. Ext: no edema  Lab Results: Recent Labs    08/09/19 0438 08/09/19 1705 08/10/19 0442 08/11/19 0411  NA 138   < > 136 137  K 4.0   < > 3.4* 3.4*  CL 108   < > 102 100  CO2 23   < > 23 25  GLUCOSE 110*   < > 93 102*  BUN 10   < > 14 17  CREATININE 0.56   < > 0.60 0.60  CALCIUM 9.0   < > 9.1 9.1  MG 2.0  --   --   --   PHOS 2.2*  --   --   --    < > = values in this interval not displayed.   Recent Labs    08/10/19 0442 08/11/19 0411  AST 15 16  ALT 16 14  ALKPHOS 55 54  BILITOT 0.8 0.8  PROT 7.1 7.3  ALBUMIN 4.1 4.2   Recent Labs    08/10/19 0442 08/11/19 0411  WBC 9.2 7.7  NEUTROABS 7.1 5.1  HGB 13.0 14.3  HCT 38.2 42.0  MCV 96.7 97.0  PLT 186 224       Assessment Cyclic nausea and vomiting, improving  Plan: -Trial of lunch, and if tolerated, she is interested in discharge.  -If she is unable to tolerate lunch, recommend short course of Reglan.  Side effects of Reglan discussed with the patient.  She is amenable to this plan. -Continue PPI and PRN antiemetics -Trial of Miralax, as patient has not had a bowel movement since Saturday, and this may be contributing to nausea.  Salley Slaughter 08/11/2019, 10:54 AM  Questions please call [phone]

## 2019-08-11 NOTE — Discharge Instructions (Signed)
Dehydration, Adult Dehydration is condition in which there is not enough water or other fluids in the body. This happens when a person loses more fluids than he or she takes in. Important body parts cannot work right without the right amount of fluids. Any loss of fluids from the body can cause dehydration. Dehydration can be mild, worse, or very bad. It should be treated right away to keep it from getting very bad. What are the causes? This condition may be caused by:  Conditions that cause loss of water or other fluids, such as: ? Watery poop (diarrhea). ? Vomiting. ? Sweating a lot. ? Peeing (urinating) a lot.  Not drinking enough fluids, especially when you: ? Are ill. ? Are doing things that take a lot of energy to do.  Other illnesses and conditions, such as fever or infection.  Certain medicines, such as medicines that take extra fluid out of the body (diuretics).  Lack of safe drinking water.  Not being able to get enough water and food. What increases the risk? The following factors may make you more likely to develop this condition:  Having a long-term (chronic) illness that has not been treated the right way, such as: ? Diabetes. ? Heart disease. ? Kidney disease.  Being 65 years of age or older.  Having a disability.  Living in a place that is high above the ground or sea (high in altitude). The thinner, dried air causes more fluid loss.  Doing exercises that put stress on your body for a long time. What are the signs or symptoms? Symptoms of dehydration depend on how bad it is. Mild or worse dehydration  Thirst.  Dry lips or dry mouth.  Feeling dizzy or light-headed, especially when you stand up from sitting.  Muscle cramps.  Your body making: ? Dark pee (urine). Pee may be the color of tea. ? Less pee than normal. ? Less tears than normal.  Headache. Very bad dehydration  Changes in skin. Skin may: ? Be cold to the touch (clammy). ? Be blotchy  or pale. ? Not go back to normal right after you lightly pinch it and let it go.  Little or no tears, pee, or sweat.  Changes in vital signs, such as: ? Fast breathing. ? Low blood pressure. ? Weak pulse. ? Pulse that is more than 100 beats a minute when you are sitting still.  Other changes, such as: ? Feeling very thirsty. ? Eyes that look hollow (sunken). ? Cold hands and feet. ? Being mixed up (confused). ? Being very tired (lethargic) or having trouble waking from sleep. ? Short-term weight loss. ? Loss of consciousness. How is this treated? Treatment for this condition depends on how bad it is. Treatment should start right away. Do not wait until your condition gets very bad. Very bad dehydration is an emergency. You will need to go to a hospital.  Mild or worse dehydration can be treated at home. You may be asked to: ? Drink more fluids. ? Drink an oral rehydration solution (ORS). This drink helps get the right amounts of fluids and salts and minerals in the blood (electrolytes).  Very bad dehydration can be treated: ? With fluids through an IV tube. ? By getting normal levels of salts and minerals in your blood. This is often done by giving salts and minerals through a tube. The tube is passed through your nose and into your stomach. ? By treating the root cause. Follow these instructions at   home: Oral rehydration solution If told by your doctor, drink an ORS:  Make an ORS. Use instructions on the package.  Start by drinking small amounts, about  cup (120 mL) every 5-10 minutes.  Slowly drink more until you have had the amount that your doctor said to have. Eating and drinking         Drink enough clear fluid to keep your pee pale yellow. If you were told to drink an ORS, finish the ORS first. Then, start slowly drinking other clear fluids. Drink fluids such as: ? Water. Do not drink only water. Doing that can make the salt (sodium) level in your body get too  low. ? Water from ice chips you suck on. ? Fruit juice that you have added water to (diluted). ? Low-calorie sports drinks.  Eat foods that have the right amounts of salts and minerals, such as: ? Bananas. ? Oranges. ? Potatoes. ? Tomatoes. ? Spinach.  Do not drink alcohol.  Avoid: ? Drinks that have a lot of sugar. These include:  High-calorie sports drinks.  Fruit juice that you did not add water to.  Soda.  Caffeine. ? Foods that are greasy or have a lot of fat or sugar. General instructions  Take over-the-counter and prescription medicines only as told by your doctor.  Do not take salt tablets. Doing that can make the salt level in your body get too high.  Return to your normal activities as told by your doctor. Ask your doctor what activities are safe for you.  Keep all follow-up visits as told by your doctor. This is important. Contact a doctor if:  You have pain in your belly (abdomen) and the pain: ? Gets worse. ? Stays in one place.  You have a rash.  You have a stiff neck.  You get angry or annoyed (irritable) more easily than normal.  You are more tired or have a harder time waking than normal.  You feel: ? Weak or dizzy. ? Very thirsty. Get help right away if you have:  Any symptoms of very bad dehydration.  Symptoms of vomiting, such as: ? You cannot eat or drink without vomiting. ? Your vomiting gets worse or does not go away. ? Your vomit has blood or green stuff in it.  Symptoms that get worse with treatment.  A fever.  A very bad headache.  Problems with peeing or pooping (having a bowel movement), such as: ? Watery poop that gets worse or does not go away. ? Blood in your poop (stool). This may cause poop to look black and tarry. ? Not peeing in 6-8 hours. ? Peeing only a small amount of very dark pee in 6-8 hours.  Trouble breathing. These symptoms may be an emergency. Do not wait to see if the symptoms will go away. Get  medical help right away. Call your local emergency services (911 in the [place] Do not drive yourself to the hospital. Summary  Dehydration is a condition in which there is not enough water or other fluids in the body. This happens when a person loses more fluids than he or she takes in.  Treatment for this condition depends on how bad it is. Treatment should be started right away. Do not wait until your condition gets very bad.  Drink enough clear fluid to keep your pee pale yellow. If you were told to drink an oral rehydration solution (ORS), finish the ORS first. Then, start slowly drinking other clear fluids.  Take over-the-counter and prescription medicines only as told by your doctor.  Get help right away if you have any symptoms of very bad dehydration. This information is not intended to replace advice given to you by your health care provider. Make sure you discuss any questions you have with your health care provider. Document Revised: 12/03/2018 Document Reviewed: 12/03/2018 Elsevier Patient Education  [place].   Leukocytosis Leukocytosis means that a person has more white blood cells than normal. White blood cells are made in the bone marrow. Bone marrow is the spongy tissue inside bones. The main job of white blood cells is to fight infection. Having too many white blood cells is a common condition. It can develop as a result of many types of medical problems. What are the causes? Leukocytosis may be caused by various conditions. In some cases, the bone marrow is normal but is still making too many white blood cells. This can be due to:  Infection.  Injury.  Physical stress.  Emotional stress.  Surgery.  Allergic reactions.  Tumors that do not start in the blood or bone marrow.  An inherited disease.  Certain medicines.  Pregnancy and labor. In other cases, a person may have a bone marrow disorder that is causing the body to make too many white blood  cells. Bone marrow disorders include:  Leukemia. This is a type of blood cancer.  Myeloproliferative disorders. These disorders cause blood cells to grow abnormally. What are the signs or symptoms? Often, this condition causes no symptoms. Some people may have symptoms due to the medical condition that is causing their leukocytosis. These symptoms may include:  Bleeding.  Bruising.  Fever.  Night sweats.  Swollen lymph nodes.  An enlarged spleen.  Repeated infections.  Weakness.  Weight loss. How is this diagnosed? This condition is diagnosed with blood tests. It is often found when blood is tested as part of a routine physical exam. You may have other tests to help determine why you have too many white blood cells. These tests may include:  A complete blood count (CBC). This test measures all the types of blood cells in your body.  Chest X-rays, urine tests, or other tests to look for signs of infection.  Bone marrow aspiration. For this test, a needle is put into your bone. Cells from the bone marrow are removed through the needle and examined under a microscope.  Other tests on the blood or bone marrow sample.  CT scan, bone scan, or other imaging tests. How is this treated? Usually, treatment is not needed for leukocytosis. However, if an infection, cancer, bone marrow disorder, or other serious problem is causing your leukocytosis, it will need to be treated. Treatment may include:  Regular monitoring of your white blood cell count to look for changes.  Antibiotic medicine if you have a bacterial infection.  Bone marrow transplant. This treatment replaces your diseased bone marrow with healthy cells that will grow new bone marrow.  Chemotherapy or biological therapies such as the use of antibodies. These treatments may be used to kill cancer cells or to decrease the number of white blood cells. Follow these instructions at home: Medicines  Take over-the-counter  and prescription medicines only as told by your health care provider.  If you were prescribed an antibiotic medicine, take it as told by your health care provider. Do not stop taking the antibiotic even if you start to feel better. Eating and drinking   Eat foods that are  low in saturated fats and high in fiber. Eat plenty of fruits and vegetables.  Drink enough fluid to keep your urine pale yellow.  Limit your intake of caffeine and alcohol. General instructions  Maintain a healthy weight. Ask your health care provider what weight is best for you.  Do 30 minutes of exercise at least 5 times each week. Check with your health care provider before you start a new exercise routine.  Follow any safety precautions as told by your health care provider. This may be needed if you are at increased risk for infection or bleeding because of your condition.  Do not use any products that contain nicotine or tobacco, such as cigarettes, e-cigarettes, and chewing tobacco. If you need help quitting, ask your health care provider.  Keep all follow-up visits as told by your health care provider. This is important. Contact a health care provider if you:  Feel weak or more tired than usual.  Develop chills, a cough, or nasal congestion.  Have a fever.  Lose weight without trying.  Have night sweats.  Bruise easily.  Have new or worsening symptoms. Get help right away if you:  Bleed more than normal.  Have chest pain.  Have trouble breathing.  Have uncontrolled nausea or vomiting.  Feel dizzy or light-headed. Summary  Leukocytosis means that a person has more white blood cells than normal.  This condition often causes no symptoms.  This condition may be caused by various conditions.  If an infection, cancer, bone marrow disorder, or other serious problem is causing your leukocytosis, it will need to be treated.  Keep all follow-up visits as told by your health care provider. This  is important. This information is not intended to replace advice given to you by your health care provider. Make sure you discuss any questions you have with your health care provider. Document Revised: 01/15/2018 Document Reviewed: 01/15/2018 Elsevier Patient Education  [place].   Nausea, Adult Nausea is the feeling that you have an upset stomach or that you are about to vomit. Nausea on its own is not usually a serious concern, but it may be an early sign of a more serious medical problem. As nausea gets worse, it can lead to vomiting. If vomiting develops, or if you are not able to drink enough fluids, you are at risk of becoming dehydrated. Dehydration can make you tired and thirsty, cause you to have a dry mouth, and decrease how often you urinate. Older adults and people with other diseases or a weak disease-fighting system (immune system) are at higher risk for dehydration. The main goals of treating your nausea are:  To relieve your nausea.  To limit repeated nausea episodes.  To prevent vomiting and dehydration. Follow these instructions at home: Watch your symptoms for any changes. Tell your health care provider about them. Follow these instructions as told by your health care provider. Eating and drinking      Take an oral rehydration solution (ORS). This is a drink that is sold at pharmacies and retail stores.  Drink clear fluids slowly and in small amounts as you are able. Clear fluids include water, ice chips, low-calorie sports drinks, and fruit juice that has water added (diluted fruit juice).  Eat bland, easy-to-digest foods in small amounts as you are able. These foods include bananas, applesauce, rice, lean meats, toast, and crackers.  Avoid drinking fluids that contain a lot of sugar or caffeine, such as energy drinks, sports drinks, and soda.  Avoid alcohol.  Avoid spicy or fatty foods. General instructions  Take over-the-counter and prescription  medicines only as told by your health care provider.  Rest at home while you recover.  Drink enough fluid to keep your urine pale yellow.  Breathe slowly and deeply when you feel nauseous.  Avoid smelling things that have strong odors.  Wash your hands often using soap and water. If soap and water are not available, use hand sanitizer.  Make sure that all people in your household wash their hands well and often.  Keep all follow-up visits as told by your health care provider. This is important. Contact a health care provider if:  Your nausea gets worse.  Your nausea does not go away after two days.  You vomit.  You cannot drink fluids without vomiting.  You have any of the following: ? New symptoms. ? A fever. ? A headache. ? Muscle cramps. ? A rash. ? Pain while urinating.  You feel light-headed or dizzy. Get help right away if:  You have pain in your chest, neck, arm, or jaw.  You feel extremely weak or you faint.  You have vomit that is bright red or looks like coffee grounds.  You have bloody or black stools or stools that look like tar.  You have a severe headache, a stiff neck, or both.  You have severe pain, cramping, or bloating in your abdomen.  You have difficulty breathing or are breathing very quickly.  Your heart is beating very quickly.  Your skin feels cold and clammy.  You feel confused.  You have signs of dehydration, such as: ? Dark urine, very little urine, or no urine. ? Cracked lips. ? Dry mouth. ? Sunken eyes. ? Sleepiness. ? Weakness. These symptoms may represent a serious problem that is an emergency. Do not wait to see if the symptoms will go away. Get medical help right away. Call your local emergency services (911 in the [place] Do not drive yourself to the hospital. Summary  Nausea is the feeling that you have an upset stomach or that you are about to vomit. Nausea on its own is not usually a serious concern, but it may be  an early sign of a more serious medical problem.  If vomiting develops, or if you are not able to drink enough fluids, you are at risk of becoming dehydrated.  Follow recommendations for eating and drinking and take over-the-counter and prescription medicines only as told by your health care provider.  Contact a health care provider right away if your symptoms worsen or you have new symptoms.  Keep all follow-up visits as told by your health care provider. This is important. This information is not intended to replace advice given to you by your health care provider. Make sure you discuss any questions you have with your health care provider. Document Revised: 09/30/2017 Document Reviewed: 09/30/2017 Elsevier Patient Education  [place].   Dehydration, Adult Dehydration is a condition in which there is not enough water or other fluids in the body. This happens when a person loses more fluids than he or she takes in. Important organs, such as the kidneys, brain, and heart, cannot function without a proper amount of fluids. Any loss of fluids from the body can lead to dehydration. Dehydration can be mild, moderate, or severe. It should be treated right away to prevent it from becoming severe. What are the causes? Dehydration may be caused by:  Conditions that cause loss of  water or other fluids, such as diarrhea, vomiting, or sweating or urinating a lot.  Not drinking enough fluids, especially when you are ill or doing activities that require a lot of energy.  Other illnesses and conditions, such as fever or infection.  Certain medicines, such as medicines that remove excess fluid from the body (diuretics).  Lack of safe drinking water.  Not being able to get enough water and food. What increases the risk? The following factors may make you more likely to develop this condition:  Having a long-term (chronic) illness that has not been treated properly, such as diabetes, heart  disease, or kidney disease.  Being 72 years of age or older.  Having a disability.  Living in a place that is high in altitude, where thinner, drier air causes more fluid loss.  Doing exercises that put stress on your body for a long time (endurance sports). What are the signs or symptoms? Symptoms of dehydration depend on how severe it is. Mild or moderate dehydration  Thirst.  Dry lips or dry mouth.  Dizziness or light-headedness, especially when standing up from a seated position.  Muscle cramps.  Dark urine. Urine may be the color of tea.  Less urine or tears produced than usual.  Headache. Severe dehydration  Changes in skin. Your skin may be cold and clammy, blotchy, or pale. Your skin also may not return to normal after being lightly pinched and released.  Little or no tears, urine, or sweat.  Changes in vital signs, such as rapid breathing and low blood pressure. Your pulse may be weak or may be faster than 100 beats a minute when you are sitting still.  Other changes, such as: ? Feeling very thirsty. ? Sunken eyes. ? Cold hands and feet. ? Confusion. ? Being very tired (lethargic) or having trouble waking from sleep. ? Short-term weight loss. ? Loss of consciousness. How is this diagnosed? This condition is diagnosed based on your symptoms and a physical exam. You may have blood and urine tests to help confirm the diagnosis. How is this treated? Treatment for this condition depends on how severe it is. Treatment should be started right away. Do not wait until dehydration becomes severe. Severe dehydration is an emergency and needs to be treated in a hospital.  Mild or moderate dehydration can be treated at home. You may be asked to: ? Drink more fluids. ? Drink an oral rehydration solution (ORS). This drink helps restore proper amounts of fluids and salts and minerals in the blood (electrolytes).  Severe dehydration can be treated: ? With IV fluids. ? By  correcting abnormal levels of electrolytes. This is often done by giving electrolytes through a tube that is passed through your nose and into your stomach (nasogastric tube, or NG tube). ? By treating the underlying cause of dehydration. Follow these instructions at home: Oral rehydration solution If told by your health care provider, drink an ORS:  Make an ORS by following instructions on the package.  Start by drinking small amounts, about  cup (120 mL) every 5-10 minutes.  Slowly increase how much you drink until you have taken the amount recommended by your health care provider. Eating and drinking         Drink enough clear fluid to keep your urine pale yellow. If you were told to drink an ORS, finish the ORS first and then start slowly drinking other clear fluids. Drink fluids such as: ? Water. Do not drink only water. Doing  that can lead to hyponatremia, which is having too little salt (sodium) in the body. ? Water from ice chips you suck on. ? Fruit juice that you have added water to (diluted fruit juice). ? Low-calorie sports drinks.  Eat foods that contain a healthy balance of electrolytes, such as bananas, oranges, potatoes, tomatoes, and spinach.  Do not drink alcohol.  Avoid the following: ? Drinks that contain a lot of sugar. These include high-calorie sports drinks, fruit juice that is not diluted, and soda. ? Caffeine. ? Foods that are greasy or contain a lot of fat or sugar. General instructions  Take over-the-counter and prescription medicines only as told by your health care provider.  Do not take sodium tablets. Doing that can lead to having too much sodium in the body (hypernatremia).  Return to your normal activities as told by your health care provider. Ask your health care provider what activities are safe for you.  Keep all follow-up visits as told by your health care provider. This is important. Contact a health care provider if:  You have muscle  cramps, pain, or discomfort, such as: ? Pain in your abdomen and the pain gets worse or stays in one area (localizes). ? Stiff neck.  You have a rash.  You are more irritable than usual.  You are sleepier or have a harder time waking than usual.  You feel weak or dizzy.  You feel very thirsty. Get help right away if you have:  Any symptoms of severe dehydration.  Symptoms of vomiting, such as: ? You cannot eat or drink without vomiting. ? Vomiting gets worse or does not go away. ? Vomit includes blood or green matter (bile).  Symptoms that get worse with treatment.  A fever.  A severe headache.  Problems with urination or bowel movements, such as: ? Diarrhea that gets worse or does not go away. ? Blood in your stool (feces). This may cause stool to look black and tarry. ? Not urinating, or urinating only a small amount of very dark urine, within 6-8 hours.  Trouble breathing. These symptoms may represent a serious problem that is an emergency. Do not wait to see if the symptoms will go away. Get medical help right away. Call your local emergency services (911 in the [place] Do not drive yourself to the hospital. Summary  Dehydration is a condition in which there is not enough water or other fluids in the body. This happens when a person loses more fluids than he or she takes in.  Treatment for this condition depends on how severe it is. Treatment should be started right away. Do not wait until dehydration becomes severe.  Drink enough clear fluid to keep your urine pale yellow. If you were told to drink an oral rehydration solution (ORS), finish the ORS first and then start slowly drinking other clear fluids.  Take over-the-counter and prescription medicines only as told by your health care provider.  Get help right away if you have any symptoms of severe dehydration. This information is not intended to replace advice given to you by your health care provider. Make sure  you discuss any questions you have with your health care provider. Document Revised: 12/03/2018 Document Reviewed: 12/03/2018 Elsevier Patient Education  [place].   Nausea, Adult Nausea is feeling sick to your stomach or feeling that you are about to throw up (vomit). Feeling sick to your stomach is usually not serious, but it may be an early sign of  a more serious medical problem. As you feel sicker to your stomach, you may throw up. If you throw up, or if you are not able to drink enough fluids, there is a risk that you may lose too much water in your body (get dehydrated). If you lose too much water in your body, you may:  Feel tired.  Feel thirsty.  Have a dry mouth.  Have cracked lips.  Go pee (urinate) less often. Older adults and people who have other diseases or a weak body defense system (immune system) have a higher risk of losing too much water in the body. The main goals of treating this condition are:  To relieve your nausea.  To ensure your nausea occurs less often.  To prevent throwing up and losing too much fluid. Follow these instructions at home: Watch your symptoms for any changes. Tell your doctor about them. Follow these instructions as told by your doctor. Eating and drinking      Take an ORS (oral rehydration solution). This is a drink that is sold at pharmacies and stores.  Drink clear fluids in small amounts as you are able. These include: ? Water. ? Ice chips. ? Fruit juice that has water added (diluted fruit juice). ? Low-calorie sports drinks.  Eat bland, easy-to-digest foods in small amounts as you are able, such as: ? Bananas. ? Applesauce. ? Rice. ? Low-fat (lean) meats. ? Toast. ? Crackers.  Avoid drinking fluids that have a lot of sugar or caffeine in them. This includes energy drinks, sports drinks, and soda.  Avoid alcohol.  Avoid spicy or fatty foods. General instructions  Take over-the-counter and prescription  medicines only as told by your doctor.  Rest at home while you get better.  Drink enough fluid to keep your pee (urine) pale yellow.  Take slow and deep breaths when you feel sick to your stomach.  Avoid food or things that have strong smells.  Wash your hands often with soap and water. If you cannot use soap and water, use hand sanitizer.  Make sure that all people in your home wash their hands well and often.  Keep all follow-up visits as told by your doctor. This is important. Contact a doctor if:  You feel sicker to your stomach.  You feel sick to your stomach for more than 2 days.  You throw up.  You are not able to drink fluids without throwing up.  You have new symptoms.  You have a fever.  You have a headache.  You have muscle cramps.  You have a rash.  You have pain while peeing.  You feel light-headed or dizzy. Get help right away if:  You have pain in your chest, neck, arm, or jaw.  You feel very weak or you pass out (faint).  You have throw up that is bright red or looks like coffee grounds.  You have bloody or black poop (stools) or poop that looks like tar.  You have a very bad headache, a stiff neck, or both.  You have very bad pain, cramping, or bloating in your belly (abdomen).  You have trouble breathing or you are breathing very quickly.  Your heart is beating very quickly.  Your skin feels cold and clammy.  You feel confused.  You have signs of losing too much water in your body, such as: ? Dark pee, very little pee, or no pee. ? Cracked lips. ? Dry mouth. ? Sunken eyes. ? Sleepiness. ? Weakness. These  symptoms may be an emergency. Do not wait to see if the symptoms will go away. Get medical help right away. Call your local emergency services (911 in the [place] Do not drive yourself to the hospital. Summary  Nausea is feeling sick to your stomach or feeling that you are about to throw up (vomit).  If you throw up, or if you  are not able to drink enough fluids, there is a risk that you may lose too much water in your body (get dehydrated).  Eat and drink what your doctor tells you. Take over-the-counter and prescription medicines only as told by your doctor.  Contact a doctor right away if your symptoms get worse or you have new symptoms.  Keep all follow-up visits as told by your doctor. This is important. This information is not intended to replace advice given to you by your health care provider. Make sure you discuss any questions you have with your health care provider. Document Revised: 09/30/2017 Document Reviewed: 09/30/2017 Elsevier Patient Education  [place].   Nausea and Vomiting, Adult Nausea is feeling sick to your stomach or feeling that you are about to throw up (vomit). Vomiting is when food in your stomach is thrown up and out of the mouth. Throwing up can make you feel weak. It can also make you lose too much water in your body (get dehydrated). If you lose too much water in your body, you may:  Feel tired.  Feel thirsty.  Have a dry mouth.  Have cracked lips.  Go pee (urinate) less often. Older adults and people with other diseases or a weak body defense system (immune system) are at higher risk for losing too much water in the body. If you feel sick to your stomach and you throw up, it is important to follow instructions from your doctor about how to take care of yourself. Follow these instructions at home: Watch your symptoms for any changes. Tell your doctor about them. Follow these instructions to care for yourself at home. Eating and drinking      Take an ORS (oral rehydration solution). This is a drink that is sold at pharmacies and stores.  Drink clear fluids in small amounts as you are able, such as: ? Water. ? Ice chips. ? Fruit juice that has water added (diluted fruit juice). ? Low-calorie sports drinks.  Eat bland, easy-to-digest foods in small amounts as  you are able, such as: ? Bananas. ? Applesauce. ? Rice. ? Low-fat (lean) meats. ? Toast. ? Crackers.  Avoid drinking fluids that have a lot of sugar or caffeine in them. This includes energy drinks, sports drinks, and soda.  Avoid alcohol.  Avoid spicy or fatty foods. General instructions  Take over-the-counter and prescription medicines only as told by your doctor.  Drink enough fluid to keep your pee (urine) pale yellow.  Wash your hands often with soap and water. If you cannot use soap and water, use hand sanitizer.  Make sure that all people in your home wash their hands well and often.  Rest at home while you get better.  Watch your condition for any changes.  Take slow and deep breaths when you feel sick to your stomach.  Keep all follow-up visits as told by your doctor. This is important. Contact a doctor if:  Your symptoms get worse.  You have new symptoms.  You have a fever.  You cannot drink fluids without throwing up.  You feel sick to your stomach  for more than 2 days.  You feel light-headed or dizzy.  You have a headache.  You have muscle cramps.  You have a rash.  You have pain while peeing. Get help right away if:  You have pain in your chest, neck, arm, or jaw.  You feel very weak or you pass out (faint).  You throw up again and again.  You have throw up that is bright red or looks like black coffee grounds.  You have bloody or black poop (stools) or poop that looks like tar.  You have a very bad headache, a stiff neck, or both.  You have very bad pain, cramping, or bloating in your belly (abdomen).  You have trouble breathing.  You are breathing very quickly.  Your heart is beating very quickly.  Your skin feels cold and clammy.  You feel confused.  You have signs of losing too much water in your body, such as: ? Dark pee, very little pee, or no pee. ? Cracked lips. ? Dry mouth. ? Sunken  eyes. ? Sleepiness. ? Weakness. These symptoms may be an emergency. Do not wait to see if the symptoms will go away. Get medical help right away. Call your local emergency services (911 in the [place] Do not drive yourself to the hospital. Summary  Nausea is feeling sick to your stomach or feeling that you are about to throw up (vomit). Vomiting is when food in your stomach is thrown up and out of the mouth.  Follow instructions from your doctor about eating and drinking to keep from losing too much water in your body.  Take over-the-counter and prescription medicines only as told by your doctor.  Contact your doctor if your symptoms get worse or you have new symptoms.  Keep all follow-up visits as told by your doctor. This is important. This information is not intended to replace advice given to you by your health care provider. Make sure you discuss any questions you have with your health care provider. Document Revised: 08/14/2018 Document Reviewed: 09/30/2017 Elsevier Patient Education  [place].

## 2019-08-13 ENCOUNTER — Other Ambulatory Visit: Payer: Self-pay

## 2019-08-13 ENCOUNTER — Encounter (HOSPITAL_BASED_OUTPATIENT_CLINIC_OR_DEPARTMENT_OTHER): Payer: Self-pay | Admitting: Emergency Medicine

## 2019-08-13 ENCOUNTER — Emergency Department (HOSPITAL_BASED_OUTPATIENT_CLINIC_OR_DEPARTMENT_OTHER)
Admission: EM | Admit: 2019-08-13 | Discharge: 2019-08-13 | Disposition: A | Discharge status: Home / Self Care | Payer: BC Managed Care – PPO | Attending: Emergency Medicine | Admitting: Emergency Medicine

## 2019-08-13 DIAGNOSIS — E860 Dehydration: Secondary | ICD-10-CM | POA: Diagnosis not present

## 2019-08-13 DIAGNOSIS — Z87891 Personal history of nicotine dependence: Secondary | ICD-10-CM | POA: Diagnosis not present

## 2019-08-13 DIAGNOSIS — R1110 Vomiting, unspecified: Secondary | ICD-10-CM | POA: Diagnosis present

## 2019-08-13 DIAGNOSIS — R1115 Cyclical vomiting syndrome unrelated to migraine: Secondary | ICD-10-CM | POA: Diagnosis not present

## 2019-08-13 LAB — URINALYSIS, ROUTINE W REFLEX MICROSCOPIC
Bilirubin Urine: NEGATIVE
Glucose, UA: NEGATIVE mg/dL
Ketones, ur: 15 mg/dL — AB
Leukocytes,Ua: NEGATIVE
Nitrite: NEGATIVE
Protein, ur: NEGATIVE mg/dL
Specific Gravity, Urine: 1.025 (ref 1.005–1.030)
pH: 6.5 (ref 5.0–8.0)

## 2019-08-13 LAB — CBC WITH DIFFERENTIAL/PLATELET
Abs Immature Granulocytes: 0.02 10*3/uL (ref 0.00–0.07)
Basophils Absolute: 0 10*3/uL (ref 0.0–0.1)
Basophils Relative: 0 %
Eosinophils Absolute: 0 10*3/uL (ref 0.0–0.5)
Eosinophils Relative: 0 %
HCT: 43.1 % (ref 36.0–46.0)
Hemoglobin: 15 g/dL (ref 12.0–15.0)
Immature Granulocytes: 0 %
Lymphocytes Relative: 15 %
Lymphs Abs: 1.6 10*3/uL (ref 0.7–4.0)
MCH: 32.8 pg (ref 26.0–34.0)
MCHC: 34.8 g/dL (ref 30.0–36.0)
MCV: 94.1 fL (ref 80.0–100.0)
Monocytes Absolute: 0.5 10*3/uL (ref 0.1–1.0)
Monocytes Relative: 5 %
Neutro Abs: 8.6 10*3/uL — ABNORMAL HIGH (ref 1.7–7.7)
Neutrophils Relative %: 80 %
Platelets: 248 10*3/uL (ref 150–400)
RBC: 4.58 MIL/uL (ref 3.87–5.11)
RDW: 11.3 % — ABNORMAL LOW (ref 11.5–15.5)
WBC: 10.8 10*3/uL — ABNORMAL HIGH (ref 4.0–10.5)
nRBC: 0 % (ref 0.0–0.2)

## 2019-08-13 LAB — RAPID URINE DRUG SCREEN, HOSP PERFORMED
Amphetamines: NOT DETECTED
Barbiturates: NOT DETECTED
Benzodiazepines: NOT DETECTED
Cocaine: NOT DETECTED
Opiates: NOT DETECTED
Tetrahydrocannabinol: POSITIVE — AB

## 2019-08-13 LAB — COMPREHENSIVE METABOLIC PANEL
ALT: 15 U/L (ref 0–44)
AST: 22 U/L (ref 15–41)
Albumin: 4.4 g/dL (ref 3.5–5.0)
Alkaline Phosphatase: 53 U/L (ref 38–126)
Anion gap: 14 (ref 5–15)
BUN: 17 mg/dL (ref 6–20)
CO2: 24 mmol/L (ref 22–32)
Calcium: 9.1 mg/dL (ref 8.9–10.3)
Chloride: 101 mmol/L (ref 98–111)
Creatinine, Ser: 0.79 mg/dL (ref 0.44–1.00)
GFR calc Af Amer: >60 mL/min (ref >60)
GFR calc non Af Amer: >60 mL/min (ref >60)
Glucose, Bld: 105 mg/dL — ABNORMAL HIGH (ref 70–99)
Potassium: 3.1 mmol/L — ABNORMAL LOW (ref 3.5–5.1)
Sodium: 139 mmol/L (ref 135–145)
Total Bilirubin: 0.8 mg/dL (ref 0.3–1.2)
Total Protein: 7.6 g/dL (ref 6.5–8.1)

## 2019-08-13 LAB — URINALYSIS, MICROSCOPIC (REFLEX)

## 2019-08-13 LAB — LIPASE, BLOOD: Lipase: 61 U/L — ABNORMAL HIGH (ref 11–51)

## 2019-08-13 MED ORDER — LACTATED RINGERS IV BOLUS
1000.0000 mL | Freq: Once | INTRAVENOUS | Status: AC
  Administered 2019-08-13: 07:00:00 1000 mL via INTRAVENOUS

## 2019-08-13 MED ORDER — HALOPERIDOL LACTATE 5 MG/ML IJ SOLN
2.0000 mg | Freq: Once | INTRAMUSCULAR | Status: AC
  Administered 2019-08-13: 07:00:00 2 mg via INTRAVENOUS
  Filled 2019-08-13: qty 1

## 2019-08-13 NOTE — ED Triage Notes (Signed)
Pt states she has been vomiting since 2 am  Pt states she was here on Saturday for same and was admitted to [facility] and was just discharged on Wednesday   Pt states took phenergan at 7pm last night  Pt states she ate supper for the first time for supper

## 2019-08-13 NOTE — ED Provider Notes (Signed)
Emergency Department Provider Note   I have reviewed the triage vital signs and the nursing notes.   HISTORY  Chief Complaint Emesis   HPI Debbie Hughes is a 29 y.o. female who presents the emerge department today with vomiting.  Patient has a history of cyclic vomiting syndrome.  Thought to be related to [facility] even though last admission she did vehemently denied it and was still positive on her blood work.  She was admitted to the hospital for few days and was just discharged on Wednesday evening.  She states that she was doing okay till this morning 2:00 she woke up with nonbloody nonbilious emesis.  Some abdominal cramping associated with it but no acute or focal pain.  No change in consciousness.  No diarrhea.  No fevers.  No cough or chest pain.   No other associated or modifying symptoms.    Past Medical History:  Diagnosis Date  . Breast mass in female 01/21/06  . Breast nodule 04/26/10   Left  . BV (bacterial vaginosis) 5.10/11  . Candida vaginitis 04/26/10  . Cyclical vomiting   . Ectopic pregnancy 03/24/2013  . Fibroadenoma   . H/O varicella   . History of chlamydia   . Irregular menstrual cycle 07/28/07  . 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 Vaginal discharge 10/01/07  . Vaginitis     Patient Active Problem List   Diagnosis Date Noted  . Tetrahydrocannabinol (THC) dependence ([place]) 08/11/2019  . Cyclic vomiting syndrome 08/08/2019  . Intractable nausea and vomiting 11/08/2018  . Hypokalemia 11/08/2018  . Leukocytosis 11/08/2018  . Hyponatremia 11/08/2018  . Normal postpartum course 08/27/2018  . Indication for care in labor or delivery 08/26/2018  . Nausea and vomiting during pregnancy 08/16/2018  . Abdominal pain during pregnancy in third trimester 08/16/2018  . Emesis 08/16/2018  . Preterm contractions 08/16/2018  . Preterm labor 07/25/2017  . NSVD (normal spontaneous vaginal delivery) 07/25/2017  . Pregnant  12/17/2016  . Breast cyst, left (excised in 2007) 11/23/2016  . Hyperemesis gravidarum 11/22/2016  . Acute generalized abdominal pain 11/22/2016  . Allergy to penicillin 09/22/2013  . Hx of ectopic pregnancy---s/p left salpingectomy 03/2013 09/22/2013    Past Surgical History:  Procedure Laterality Date  . BREAST CYST EXCISION Left 2007  . DILATION AND CURETTAGE OF UTERUS    . LAPAROSCOPY N/A 03/24/2013   Procedure: LAPAROSCOPY OPERATIVE;  Surgeon: Betsy Coder, MD;  Location: [facility];  Service: Gynecology;  Laterality: N/A;  . Left Salpingectomy  03/24/2013   Ectopic Pregnancy  . UNILATERAL SALPINGECTOMY Left 03/24/2013   Procedure: UNILATERAL SALPINGECTOMY;  Surgeon: Betsy Coder, MD;  Location: [facility];  Service: Gynecology;  Laterality: Left;    Current Outpatient Rx  . Order #: [id number] Class: Normal  . Order #: [id number] Class: Normal    Allergies Penicillins  Family History  Problem Relation Age of Onset  . Asthma Maternal Grandmother   . Hypertension Maternal Grandmother   . Diabetes Maternal Grandmother   . Hypertension Paternal Grandfather   . Diabetes Paternal Grandfather     Social History Social History   Tobacco Use  . Smoking status: Former Research scientist (life sciences)  . Smokeless tobacco: Never Used  Substance Use Topics  . Alcohol use: Yes    Comment: occasional   . Drug use: Not on file    Comment: states she has not smoked marijuana in 2 months    Review of Systems  All other systems negative  except as documented in the HPI. All pertinent positives and negatives as reviewed in the HPI. ____________________________________________   PHYSICAL EXAM:  VITAL SIGNS: ED Triage Vitals  Enc Vitals Group     BP 08/13/19 0623 120/81     Pulse Rate 08/13/19 0623 87     Resp 08/13/19 0623 16     Temp 08/13/19 0623 99.2 F (37.3 C)     Temp Source 08/13/19 0623 Oral     SpO2 08/13/19 0623 100 %     Weight 08/13/19 0621 105 lb (47.6 kg)     Height 08/13/19 0621  5\' 3"  (1.6 m)    Constitutional: Alert and oriented. Well appearing and in no acute distress. Eyes: Conjunctivae are normal. PERRL. EOMI. Head: Atraumatic. Nose: No congestion/rhinnorhea. Mouth/Throat: Mucous membranes are moist.  Oropharynx non-erythematous. Neck: No stridor.  No meningeal signs.   Cardiovascular: Normal rate, regular rhythm. Good peripheral circulation. Grossly normal heart sounds.   Respiratory: Normal respiratory effort.  No retractions. Lungs CTAB. Gastrointestinal: Soft and nontender. No distention.  Musculoskeletal: No lower extremity tenderness nor edema. No gross deformities of extremities. Neurologic:  Normal speech and language. No gross focal neurologic deficits are appreciated.  Skin:  Skin is warm, dry and intact. No rash noted.  ____________________________________________   LABS (all labs ordered are listed, but only abnormal results are displayed)  Labs Reviewed  CBC WITH DIFFERENTIAL/PLATELET  COMPREHENSIVE METABOLIC PANEL  LIPASE, BLOOD  URINALYSIS, ROUTINE W REFLEX MICROSCOPIC   ____________________________________________  EKG   EKG Interpretation  Date/Time:    Ventricular Rate:    PR Interval:    QRS Duration:   QT Interval:    QTC Calculation:   R Axis:     Text Interpretation:         ____________________________________________  RADIOLOGY  No results found.  ____________________________________________   PROCEDURES  Procedure(s) performed:   Procedures   ____________________________________________   INITIAL IMPRESSION / ASSESSMENT AND PLAN / ED COURSE  Cyclical vomiting. Benign abdomen.   Care transferred pending labs and reevaluation for response to medications.      Pertinent labs & imaging results that were available during my care of the patient were reviewed by me and considered in my medical decision making (see chart for details).  ____________________________________________  FINAL CLINICAL  IMPRESSION(S) / ED DIAGNOSES  Final diagnoses:  None     MEDICATIONS GIVEN DURING THIS VISIT:  Medications  lactated ringers bolus 1,000 mL (has no administration in time range)  haloperidol lactate (HALDOL) injection 2 mg (has no administration in time range)     NEW OUTPATIENT MEDICATIONS STARTED DURING THIS VISIT:  New Prescriptions   No medications on file    Note:  This note was prepared with assistance of Dragon voice recognition software. Occasional wrong-word or sound-a-like substitutions may have occurred due to the inherent limitations of voice recognition software.   Angy Swearengin, Corene Cornea, MD 08/21/19 [phone]

## 2019-08-13 NOTE — ED Provider Notes (Signed)
Pt signed out by Dr. Dolly Rias.  Pt is feeling much better.  She is able to tolerate po fluids.  She has phenergan at home and thinks that will do as she is now able to keep down pills.  She is stable for d/c.  Return if worse.   Isla Pence, MD 08/13/19 [phone]

## 2019-09-02 ENCOUNTER — Other Ambulatory Visit (HOSPITAL_COMMUNITY): Payer: BC Managed Care – PPO

## 2019-11-15 ENCOUNTER — Encounter (HOSPITAL_BASED_OUTPATIENT_CLINIC_OR_DEPARTMENT_OTHER): Payer: Self-pay | Admitting: Emergency Medicine

## 2019-11-15 ENCOUNTER — Emergency Department (HOSPITAL_BASED_OUTPATIENT_CLINIC_OR_DEPARTMENT_OTHER): Payer: BC Managed Care – PPO

## 2019-11-15 ENCOUNTER — Inpatient Hospital Stay (HOSPITAL_BASED_OUTPATIENT_CLINIC_OR_DEPARTMENT_OTHER)
Admission: EM | Admit: 2019-11-15 | Discharge: 2019-11-18 | DRG: 392 | Disposition: A | Discharge status: Home / Self Care | Payer: BC Managed Care – PPO | Attending: Family Medicine | Admitting: Family Medicine

## 2019-11-15 DIAGNOSIS — R112 Nausea with vomiting, unspecified: Secondary | ICD-10-CM | POA: Diagnosis present

## 2019-11-15 DIAGNOSIS — K3184 Gastroparesis: Principal | ICD-10-CM | POA: Diagnosis present

## 2019-11-15 DIAGNOSIS — Z8249 Family history of ischemic heart disease and other diseases of the circulatory system: Secondary | ICD-10-CM | POA: Diagnosis not present

## 2019-11-15 DIAGNOSIS — Z20822 Contact with and (suspected) exposure to covid-19: Secondary | ICD-10-CM | POA: Diagnosis not present

## 2019-11-15 DIAGNOSIS — Z833 Family history of diabetes mellitus: Secondary | ICD-10-CM | POA: Diagnosis not present

## 2019-11-15 DIAGNOSIS — Z825 Family history of asthma and other chronic lower respiratory diseases: Secondary | ICD-10-CM | POA: Diagnosis not present

## 2019-11-15 DIAGNOSIS — D72829 Elevated white blood cell count, unspecified: Secondary | ICD-10-CM | POA: Diagnosis not present

## 2019-11-15 DIAGNOSIS — F12188 Cannabis abuse with other cannabis-induced disorder: Secondary | ICD-10-CM

## 2019-11-15 DIAGNOSIS — E876 Hypokalemia: Secondary | ICD-10-CM | POA: Diagnosis not present

## 2019-11-15 LAB — RAPID URINE DRUG SCREEN, HOSP PERFORMED
Amphetamines: NOT DETECTED
Barbiturates: NOT DETECTED
Benzodiazepines: NOT DETECTED
Cocaine: NOT DETECTED
Opiates: NOT DETECTED
Tetrahydrocannabinol: POSITIVE — AB

## 2019-11-15 LAB — COMPREHENSIVE METABOLIC PANEL
ALT: 24 U/L (ref 0–44)
AST: 32 U/L (ref 15–41)
Albumin: 4.5 g/dL (ref 3.5–5.0)
Alkaline Phosphatase: 57 U/L (ref 38–126)
Anion gap: 14 (ref 5–15)
BUN: 18 mg/dL (ref 6–20)
CO2: 20 mmol/L — ABNORMAL LOW (ref 22–32)
Calcium: 9.3 mg/dL (ref 8.9–10.3)
Chloride: 105 mmol/L (ref 98–111)
Creatinine, Ser: 0.69 mg/dL (ref 0.44–1.00)
GFR calc Af Amer: >60 mL/min (ref >60)
GFR calc non Af Amer: >60 mL/min (ref >60)
Glucose, Bld: 161 mg/dL — ABNORMAL HIGH (ref 70–99)
Potassium: 3.5 mmol/L (ref 3.5–5.1)
Sodium: 139 mmol/L (ref 135–145)
Total Bilirubin: 0.4 mg/dL (ref 0.3–1.2)
Total Protein: 7.5 g/dL (ref 6.5–8.1)

## 2019-11-15 LAB — CBC WITH DIFFERENTIAL/PLATELET
Abs Immature Granulocytes: 0.06 10*3/uL (ref 0.00–0.07)
Basophils Absolute: 0 10*3/uL (ref 0.0–0.1)
Basophils Relative: 0 %
Eosinophils Absolute: 0 10*3/uL (ref 0.0–0.5)
Eosinophils Relative: 0 %
HCT: 39.7 % (ref 36.0–46.0)
Hemoglobin: 13.9 g/dL (ref 12.0–15.0)
Immature Granulocytes: 1 %
Lymphocytes Relative: 12 %
Lymphs Abs: 1.3 10*3/uL (ref 0.7–4.0)
MCH: 33.4 pg (ref 26.0–34.0)
MCHC: 35 g/dL (ref 30.0–36.0)
MCV: 95.4 fL (ref 80.0–100.0)
Monocytes Absolute: 0.4 10*3/uL (ref 0.1–1.0)
Monocytes Relative: 4 %
Neutro Abs: 8.8 10*3/uL — ABNORMAL HIGH (ref 1.7–7.7)
Neutrophils Relative %: 83 %
Platelets: 219 10*3/uL (ref 150–400)
RBC: 4.16 MIL/uL (ref 3.87–5.11)
RDW: 11.7 % (ref 11.5–15.5)
WBC: 10.6 10*3/uL — ABNORMAL HIGH (ref 4.0–10.5)
nRBC: 0 % (ref 0.0–0.2)

## 2019-11-15 LAB — URINALYSIS, ROUTINE W REFLEX MICROSCOPIC
Bilirubin Urine: NEGATIVE
Glucose, UA: 100 mg/dL — AB
Hgb urine dipstick: NEGATIVE
Ketones, ur: 15 mg/dL — AB
Leukocytes,Ua: NEGATIVE
Nitrite: NEGATIVE
Protein, ur: NEGATIVE mg/dL
Specific Gravity, Urine: 1.02 (ref 1.005–1.030)
pH: 8.5 — ABNORMAL HIGH (ref 5.0–8.0)

## 2019-11-15 LAB — SARS CORONAVIRUS 2 BY RT PCR (HOSPITAL ORDER, PERFORMED IN ~~LOC~~ HOSPITAL LAB): SARS Coronavirus 2: NEGATIVE

## 2019-11-15 LAB — MAGNESIUM: Magnesium: 1.6 mg/dL — ABNORMAL LOW (ref 1.7–2.4)

## 2019-11-15 LAB — LIPASE, BLOOD: Lipase: 27 U/L (ref 11–51)

## 2019-11-15 LAB — PREGNANCY, URINE: Preg Test, Ur: NEGATIVE

## 2019-11-15 MED ORDER — FAMOTIDINE IN NACL 20-0.9 MG/50ML-% IV SOLN
20.0000 mg | Freq: Once | INTRAVENOUS | Status: AC
  Administered 2019-11-15: 20 mg via INTRAVENOUS
  Filled 2019-11-15: qty 50

## 2019-11-15 MED ORDER — METOCLOPRAMIDE HCL 5 MG/ML IJ SOLN
5.0000 mg | Freq: Once | INTRAMUSCULAR | Status: AC
  Administered 2019-11-15: 5 mg via INTRAVENOUS
  Filled 2019-11-15: qty 2

## 2019-11-15 MED ORDER — ONDANSETRON HCL 4 MG/2ML IJ SOLN
4.0000 mg | Freq: Four times a day (QID) | INTRAMUSCULAR | Status: DC | PRN
  Administered 2019-11-15 – 2019-11-18 (×6): 4 mg via INTRAVENOUS
  Filled 2019-11-15 (×7): qty 2

## 2019-11-15 MED ORDER — PROMETHAZINE HCL 25 MG/ML IJ SOLN
25.0000 mg | Freq: Once | INTRAMUSCULAR | Status: AC
  Administered 2019-11-15: 25 mg via INTRAVENOUS
  Filled 2019-11-15: qty 1

## 2019-11-15 MED ORDER — ALUM & MAG HYDROXIDE-SIMETH 200-200-20 MG/5ML PO SUSP
30.0000 mL | Freq: Once | ORAL | Status: AC
  Administered 2019-11-15: 30 mL via ORAL
  Filled 2019-11-15: qty 30

## 2019-11-15 MED ORDER — SODIUM CHLORIDE 0.9 % IV SOLN: INTRAVENOUS | Status: AC

## 2019-11-15 MED ORDER — SODIUM CHLORIDE 0.9 % IV BOLUS
1000.0000 mL | Freq: Once | INTRAVENOUS | Status: AC
  Administered 2019-11-15: 1000 mL via INTRAVENOUS

## 2019-11-15 MED ORDER — ENOXAPARIN SODIUM 40 MG/0.4ML ~~LOC~~ SOLN
40.0000 mg | SUBCUTANEOUS | Status: DC
  Administered 2019-11-15 – 2019-11-17 (×3): 40 mg via SUBCUTANEOUS
  Filled 2019-11-15 (×3): qty 0.4

## 2019-11-15 MED ORDER — FENTANYL CITRATE (PF) 100 MCG/2ML IJ SOLN
25.0000 ug | Freq: Once | INTRAMUSCULAR | Status: AC
  Administered 2019-11-15: 25 ug via INTRAVENOUS
  Filled 2019-11-15: qty 2

## 2019-11-15 MED ORDER — DROPERIDOL 2.5 MG/ML IJ SOLN
1.2500 mg | Freq: Once | INTRAMUSCULAR | Status: AC
  Administered 2019-11-15: 1.25 mg via INTRAVENOUS
  Filled 2019-11-15: qty 2

## 2019-11-15 MED ORDER — ONDANSETRON HCL 4 MG PO TABS: 4.0000 mg | ORAL_TABLET | Freq: Four times a day (QID) | ORAL | Status: DC | PRN

## 2019-11-15 MED ORDER — DIPHENHYDRAMINE HCL 50 MG/ML IJ SOLN
25.0000 mg | Freq: Once | INTRAMUSCULAR | Status: AC
  Administered 2019-11-15: 25 mg via INTRAVENOUS
  Filled 2019-11-15: qty 1

## 2019-11-15 NOTE — ED Notes (Signed)
PT states she just had another episode of emesis and does not think she can fluid challenge at this time.

## 2019-11-15 NOTE — ED Notes (Signed)
Pt states nausea is some better

## 2019-11-15 NOTE — Progress Notes (Deleted)
Secretary put in information for ED to call report @ 1940, ED has not called. Called ED to get report and was put on hold for 5 minutes and never got to speak with anyone. Dawson Bills, RN

## 2019-11-15 NOTE — H&P (Addendum)
History and Physical    Debbie Hughes IHK:[id number] DOB: 23-Aug-1990 DOA: 11/15/2019  PCP: [name], PA-C  Patient coming from: Home.  Chief Complaint: Nausea vomiting.  HPI: Debbie Hughes is a 29 y.o. 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  Denies any diarrhea fever chills or any blood in the vomitus.  ED Course: In the ER patient was given multiple doses of antiemetic despite which patient was still vomiting and is to be admitted for further management.  Labs are remarkable for mild hypomagnesemia 1.6 with mild leukocytosis of 10.6 Covid test negative glucose 161 AST ALT and total bilirubin were normal.  On my exam abdomen appears benign.  UA is remarkable for glucose negative pregnancy positive for tetrahydrocannabinol.  Review of Systems: As per HPI, rest all negative.   Past Medical History:  Diagnosis Date  . Breast mass in female 01/21/06  . Breast nodule 04/26/10   Left  . BV (bacterial vaginosis) 5.10/11  . Candida vaginitis 04/26/10  . Cyclical vomiting   . Ectopic pregnancy 03/24/2013  . Fibroadenoma   . H/O varicella   . History of chlamydia   . Irregular menstrual cycle 07/28/07  . 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 Vaginal discharge 10/01/07  . Vaginitis     Past Surgical History:  Procedure Laterality Date  . BREAST CYST EXCISION Left 2007  . DILATION AND CURETTAGE OF UTERUS    . LAPAROSCOPY N/A 03/24/2013   Procedure: LAPAROSCOPY OPERATIVE;  Surgeon: Betsy Coder, MD;  Location: [facility];  Service: Gynecology;  Laterality: N/A;  . Left Salpingectomy  03/24/2013   Ectopic Pregnancy  . UNILATERAL SALPINGECTOMY Left 03/24/2013   Procedure: UNILATERAL SALPINGECTOMY;  Surgeon: Betsy Coder, MD;  Location: [facility];  Service:  Gynecology;  Laterality: Left;     reports that she has quit smoking. She has never used smokeless tobacco. She reports current alcohol use.  Drug: Marijuana.  Allergies  Allergen Reactions  . Penicillins Rash    Has patient had a PCN reaction causing immediate rash, facial/tongue/throat swelling, SOB or lightheadedness with hypotension: yes Has patient had a PCN reaction causing severe rash involving mucus membranes or skin necrosis: no Has patient had a PCN reaction that required hospitalization no Has patient had a PCN reaction occurring within the last 10 years: yes If all of the above answers are "NO", then may proceed with Cephalosporin use.     Family History  Problem Relation Age of Onset  . Asthma Maternal Grandmother   . Hypertension Maternal Grandmother   . Diabetes Maternal Grandmother   . Hypertension Paternal Grandfather   . Diabetes Paternal Grandfather     Prior to Admission medications   Medication Sig Start Date End Date Taking? Authorizing Provider  pantoprazole (PROTONIX) 40 MG tablet Take 1 tablet (40 mg total) by mouth daily. 11/09/18 08/08/19  Antonieta Pert, MD  promethazine (PHENERGAN) 50 MG tablet Take 1 tablet (50 mg total) by mouth every 6 (six) hours as needed for nausea or vomiting. Patient not taking: Reported on 11/15/2019 08/11/19   Arrien, Jimmy Picket, MD    Physical Exam: Constitutional: Moderately built and nourished. Vitals:   11/15/19 1702 11/15/19 2000 11/15/19 2004 11/15/19 2111  BP: (!) 136/109 128/73 128/73 113/60  Pulse: 86 78 83 89  Resp: 18  16 19 18   Temp:    98 F (36.7 C)  TempSrc:    Oral  SpO2: 100% 100% 100% 100%   Eyes: Anicteric no pallor. ENMT: No discharge from the ears eyes nose or mouth. Neck: No mass felt.  No neck rigidity. Respiratory: No rhonchi or crepitations. Cardiovascular: S1-S2 heard. Abdomen: Soft nontender bowel sounds present. Musculoskeletal: No edema. Skin: No rash. Neurologic: Alert awake oriented to  time place and person.  Moves all extremities. Psychiatric: Appears normal but normal affect.   Labs on Admission: I have personally reviewed following labs and imaging studies  CBC: Recent Labs  Lab 11/15/19 1121  WBC 10.6*  NEUTROABS 8.8*  HGB 13.9  HCT 39.7  MCV 95.4  PLT 409   Basic Metabolic Panel: Recent Labs  Lab 11/15/19 1121  NA 139  K 3.5  CL 105  CO2 20*  GLUCOSE 161*  BUN 18  CREATININE 0.69  CALCIUM 9.3  MG 1.6*   GFR: CrCl cannot be calculated (Unknown ideal weight.). Liver Function Tests: Recent Labs  Lab 11/15/19 1121  AST 32  ALT 24  ALKPHOS 57  BILITOT 0.4  PROT 7.5  ALBUMIN 4.5   Recent Labs  Lab 11/15/19 1121  LIPASE 27   No results for input(s): AMMONIA in the last 168 hours. Coagulation Profile: No results for input(s): INR, PROTIME in the last 168 hours. Cardiac Enzymes: No results for input(s): CKTOTAL, CKMB, CKMBINDEX, TROPONINI in the last 168 hours. BNP (last 3 results) No results for input(s): PROBNP in the last 8760 hours. HbA1C: No results for input(s): HGBA1C in the last 72 hours. CBG: No results for input(s): GLUCAP in the last 168 hours. Lipid Profile: No results for input(s): CHOL, HDL, LDLCALC, TRIG, CHOLHDL, LDLDIRECT in the last 72 hours. Thyroid Function Tests: No results for input(s): TSH, T4TOTAL, FREET4, T3FREE, THYROIDAB in the last 72 hours. Anemia Panel: No results for input(s): VITAMINB12, FOLATE, FERRITIN, TIBC, IRON, RETICCTPCT in the last 72 hours. Urine analysis:    Component Value Date/Time   COLORURINE YELLOW 11/15/2019 1230   APPEARANCEUR CLEAR 11/15/2019 1230   LABSPEC 1.020 11/15/2019 1230   PHURINE 8.5 (H) 11/15/2019 1230   GLUCOSEU 100 (A) 11/15/2019 1230   HGBUR NEGATIVE 11/15/2019 1230   BILIRUBINUR NEGATIVE 11/15/2019 1230   KETONESUR 15 (A) 11/15/2019 1230   PROTEINUR NEGATIVE 11/15/2019 1230   UROBILINOGEN 0.2 09/21/2013 1930   NITRITE NEGATIVE 11/15/2019 1230   LEUKOCYTESUR  NEGATIVE 11/15/2019 1230   Sepsis Labs: @LABRCNTIP (procalcitonin:4,lacticidven:4) ) Recent Results (from the past 240 hour(s))  SARS Coronavirus 2 by RT PCR (hospital order, performed in [facility] hospital lab) Nasopharyngeal Nasopharyngeal Swab     Status: None   Collection Time: 11/15/19  5:15 PM   Specimen: Nasopharyngeal Swab  Result Value Ref Range Status   SARS Coronavirus 2 NEGATIVE NEGATIVE Final    Comment: (NOTE) SARS-CoV-2 target nucleic acids are NOT DETECTED.  The SARS-CoV-2 RNA is generally detectable in upper and lower respiratory specimens during the acute phase of infection. The lowest concentration of SARS-CoV-2 viral copies this assay can detect is 250 copies / mL. A negative result does not preclude SARS-CoV-2 infection and should not be used as the sole basis for treatment or other patient management decisions.  A negative result may occur with improper specimen collection / handling, submission of specimen other than nasopharyngeal swab, presence of viral mutation(s) within the areas targeted by this assay, and inadequate number of viral copies (<250 copies / mL).  A negative result must be combined with clinical observations, patient history, and epidemiological information.  Fact Sheet for Patients:   StrictlyIdeas.no  Fact Sheet for Healthcare Providers: BankingDealers.co.za  This test is not yet approved or  cleared by the [place] FDA and has been authorized for detection and/or diagnosis of SARS-CoV-2 by FDA under an Emergency Use Authorization (EUA).  This EUA will remain in effect (meaning this test can be used) for the duration of the COVID-19 declaration under Section 564(b)(1) of the Act, 21 U.S.C. section 360bbb-3(b)(1), unless the authorization is terminated or revoked sooner.  Performed at [facility], [address] [place], [place] [address]      Radiological Exams on  Admission: DG ABD ACUTE 2+V W 1V CHEST  Result Date: 11/15/2019 CLINICAL DATA:  Vomiting, lower abdominal pain EXAM: DG ABDOMEN ACUTE W/ 1V CHEST COMPARISON:  02/13/2019 FINDINGS: Supine and upright frontal views of the abdomen as well as an upright frontal view of the chest are obtained. Cardiac silhouette is unremarkable. No airspace disease, effusion, or pneumothorax. Bowel gas pattern is unremarkable with no obstruction or ileus. 3 mm calcification left mid abdomen consistent with known left renal calculus. IUD overlies the pelvis. 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 No acute bony abnormalities. IMPRESSION: 1. Stable 3 mm left renal calculus. 2. Unremarkable bowel gas pattern. 3. No acute intrathoracic process. Electronically Signed   By: Randa Ngo M.D.   On: 11/15/2019 18:34     Assessment/Plan Principal Problem:   Intractable nausea and vomiting Active Problems:   Nausea & vomiting    1. Intractable nausea vomiting -patient has normal LFTs and acute abdominal series.  Abdomen appears benign on exam.  Likely due to tetrahydrocannabinol use.  We will keep patient IV fluids antiemetics and closely observe. 2. History of marijuana abuse patient states he does not smoke marijuana but only uses CBD's. 3. Mild leukocytosis likely reactionary. 4. Hypomagnesemia likely from vomiting replace and recheck. 5. Hyperglycemia -check hemoglobin A1c with next blood draw.   DVT prophylaxis: Lovenox. Code Status: Full code. Family Communication: Discussed with patient. Disposition Plan: Home. Consults called: None. Admission status: Observation.   Rise Patience MD [facility] Pager [phone].  If 7PM-7AM, please contact night-coverage [email or web address] Password [facility]  11/15/2019, 9:18 PM

## 2019-11-15 NOTE — ED Provider Notes (Signed)
  Physical Exam  BP (!) 136/109 (BP Location: Left Arm)   Pulse 86   Resp 18   SpO2 100%   Physical Exam  ED Course/Procedures     Procedures  MDM  Care assumed at 3 PM.  Patient has history of cyclic vomiting syndrome.  Patient did vape some CBD this weekend.  She states that she is also under a lot of stress.  Labs are unremarkable and UDS is positive for marijuana.  She failed 1 p.o. trial and ordered more droperidol as well as IV fluids and Reglan Phenergan and signout pending second p.o. trial.  5:29 PM Patient still unable to tolerate p.o.  She is on her third IV bolus.  Will admit for intractable vomiting likely secondary to cannabis hyperemesis.      Drenda Freeze, MD 11/15/19 1730

## 2019-11-15 NOTE — ED Notes (Signed)
Patient transported to X-ray 

## 2019-11-15 NOTE — ED Provider Notes (Signed)
[facility] EMERGENCY DEPARTMENT Provider Note   CSN: [id number] Arrival date & time: 11/15/19  1013     History Chief Complaint  Patient presents with  . Emesis    Debbie Hughes is a 29 y.o. female.  29 yo F with a chief complaints of nausea and vomiting.  Patient has a history of cyclic vomiting syndrome and thinks this feels the same.  Going on since yesterday.  Not able to keep anything down including her antiemetics at home.  No new areas of pain no fever.  The history is provided by the patient.  Emesis Associated symptoms: abdominal pain (diffuse) and diarrhea   Associated symptoms: no arthralgias, no chills, no fever, no headaches and no myalgias   Illness Severity:  Moderate Onset quality:  Gradual Duration:  2 days Timing:  Constant Progression:  Worsening Chronicity:  New Associated symptoms: abdominal pain (diffuse), diarrhea and vomiting   Associated symptoms: no chest pain, no congestion, no fever, no headaches, no myalgias, no nausea, no rhinorrhea, no shortness of breath and no wheezing        Past Medical History:  Diagnosis Date  . Breast mass in female 01/21/06  . Breast nodule 04/26/10   Left  . BV (bacterial vaginosis) 5.10/11  . Candida vaginitis 04/26/10  . Cyclical vomiting   . Ectopic pregnancy 03/24/2013  . Fibroadenoma   . H/O varicella   . History of chlamydia   . Irregular menstrual cycle 07/28/07  . 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 Vaginal discharge 10/01/07  . Vaginitis     Patient Active Problem List   Diagnosis Date Noted  . Nausea & vomiting 11/15/2019  . Tetrahydrocannabinol (THC) dependence ([place]) 08/11/2019  . Cyclic vomiting syndrome 08/08/2019  . Intractable nausea and vomiting 11/08/2018  . Hypokalemia 11/08/2018  . Leukocytosis 11/08/2018  . Hyponatremia 11/08/2018  . Normal postpartum course 08/27/2018  . Indication for care in labor or delivery 08/26/2018  .  Nausea and vomiting during pregnancy 08/16/2018  . Abdominal pain during pregnancy in third trimester 08/16/2018  . Emesis 08/16/2018  . Preterm contractions 08/16/2018  . Preterm labor 07/25/2017  . NSVD (normal spontaneous vaginal delivery) 07/25/2017  . Pregnant 12/17/2016  . Breast cyst, left (excised in 2007) 11/23/2016  . Hyperemesis gravidarum 11/22/2016  . Acute generalized abdominal pain 11/22/2016  . Allergy to penicillin 09/22/2013  . Hx of ectopic pregnancy---s/p left salpingectomy 03/2013 09/22/2013    Past Surgical History:  Procedure Laterality Date  . BREAST CYST EXCISION Left 2007  . DILATION AND CURETTAGE OF UTERUS    . LAPAROSCOPY N/A 03/24/2013   Procedure: LAPAROSCOPY OPERATIVE;  Surgeon: Betsy Coder, MD;  Location: [facility];  Service: Gynecology;  Laterality: N/A;  . Left Salpingectomy  03/24/2013   Ectopic Pregnancy  . UNILATERAL SALPINGECTOMY Left 03/24/2013   Procedure: UNILATERAL SALPINGECTOMY;  Surgeon: Betsy Coder, MD;  Location: [facility];  Service: Gynecology;  Laterality: Left;     OB History    Gravida  5   Para  3   Term  2   Preterm  1   AB  2   Living  3     SAB  1   TAB      Ectopic  1   Multiple  0   Live Births  3           Family History  Problem Relation Age of Onset  . Asthma Maternal  Grandmother   . Hypertension Maternal Grandmother   . Diabetes Maternal Grandmother   . Hypertension Paternal Grandfather   . Diabetes Paternal Grandfather     Social History   Tobacco Use  . Smoking status: Former Research scientist (life sciences)  . Smokeless tobacco: Never Used  Vaping Use  . Vaping Use: Never used  Substance Use Topics  . Alcohol use: Yes    Comment: occasional   . Drug use: Not on file    Comment: states she has not smoked marijuana in 2 months    Home Medications Prior to Admission medications   Medication Sig Start Date End Date Taking? Authorizing Provider  pantoprazole (PROTONIX) 40 MG tablet Take 1 tablet (40 mg  total) by mouth daily. 11/09/18 08/08/19  Antonieta Pert, MD  promethazine (PHENERGAN) 50 MG tablet Take 1 tablet (50 mg total) by mouth every 6 (six) hours as needed for nausea or vomiting. Patient not taking: Reported on 11/15/2019 08/11/19   Arrien, Jimmy Picket, MD    Allergies    Penicillins  Review of Systems   Review of Systems  Constitutional: Negative for chills and fever.  HENT: Negative for congestion and rhinorrhea.   Eyes: Negative for redness and visual disturbance.  Respiratory: Negative for shortness of breath and wheezing.   Cardiovascular: Negative for chest pain and palpitations.  Gastrointestinal: Positive for abdominal pain (diffuse), diarrhea and vomiting. Negative for nausea.  Genitourinary: Negative for dysuria and urgency.  Musculoskeletal: Negative for arthralgias and myalgias.  Skin: Negative for pallor and wound.  Neurological: Negative for dizziness and headaches.    Physical Exam Updated Vital Signs BP (!) 140/94 (BP Location: Left Arm)   Pulse 82   Temp (!) 97.3 F (36.3 C)   Resp 14   SpO2 100%   Physical Exam Vitals and nursing note reviewed.  Constitutional:      General: She is not in acute distress.    Appearance: She is well-developed. She is not diaphoretic.  HENT:     Head: Normocephalic and atraumatic.  Eyes:     Pupils: Pupils are equal, round, and reactive to light.  Cardiovascular:     Rate and Rhythm: Normal rate and regular rhythm.     Heart sounds: No murmur heard.  No friction rub. No gallop.   Pulmonary:     Effort: Pulmonary effort is normal.     Breath sounds: No wheezing or rales.  Abdominal:     General: There is no distension.     Palpations: Abdomen is soft.     Tenderness: There is no abdominal tenderness. There is no guarding.     Comments: Benign abdominal exam  Musculoskeletal:        General: No tenderness.     Cervical back: Normal range of motion and neck supple.  Skin:    General: Skin is warm and dry.    Neurological:     Mental Status: She is alert and oriented to person, place, and time.  Psychiatric:        Behavior: Behavior normal.     ED Results / Procedures / Treatments   Labs (all labs ordered are listed, but only abnormal results are displayed) Labs Reviewed  CBC WITH DIFFERENTIAL/PLATELET - Abnormal; Notable for the following components:      Result Value   WBC 10.6 (*)    Neutro Abs 8.8 (*)    All other components within normal limits  COMPREHENSIVE METABOLIC PANEL - Abnormal; Notable for the following components:  CO2 20 (*)    Glucose, Bld 161 (*)    All other components within normal limits  RAPID URINE DRUG SCREEN, HOSP PERFORMED - Abnormal; Notable for the following components:   Tetrahydrocannabinol POSITIVE (*)    All other components within normal limits  URINALYSIS, ROUTINE W REFLEX MICROSCOPIC - Abnormal; Notable for the following components:   pH 8.5 (*)    Glucose, UA 100 (*)    Ketones, ur 15 (*)    All other components within normal limits  MAGNESIUM - Abnormal; Notable for the following components:   Magnesium 1.6 (*)    All other components within normal limits  BASIC METABOLIC PANEL - Abnormal; Notable for the following components:   Potassium 3.2 (*)    CO2 19 (*)    Glucose, Bld 120 (*)    All other components within normal limits  CBC - Abnormal; Notable for the following components:   WBC 14.5 (*)    All other components within normal limits  SARS CORONAVIRUS 2 BY RT PCR (HOSPITAL ORDER, [facility])  LIPASE, BLOOD  PREGNANCY, URINE  HIV ANTIBODY (ROUTINE TESTING W REFLEX)  HEPATIC FUNCTION PANEL    EKG None  Radiology DG ABD ACUTE 2+V W 1V CHEST  Result Date: 11/15/2019 CLINICAL DATA:  Vomiting, lower abdominal pain EXAM: DG ABDOMEN ACUTE W/ 1V CHEST COMPARISON:  02/13/2019 FINDINGS: Supine and upright frontal views of the abdomen as well as an upright frontal view of the chest are obtained. Cardiac  silhouette is unremarkable. No airspace disease, effusion, or pneumothorax. Bowel gas pattern is unremarkable with no obstruction or ileus. 3 mm calcification left mid abdomen consistent with known left renal calculus. IUD overlies the pelvis. 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 No acute bony abnormalities. IMPRESSION: 1. Stable 3 mm left renal calculus. 2. Unremarkable bowel gas pattern. 3. No acute intrathoracic process. Electronically Signed   By: Randa Ngo M.D.   On: 11/15/2019 18:34    Procedures Procedures (including critical care time)  Medications Ordered in ED Medications  ondansetron (ZOFRAN) tablet 4 mg ( Oral See Alternative 11/16/19 0958)    Or  ondansetron (ZOFRAN) injection 4 mg (4 mg Intravenous Given 11/16/19 0958)  enoxaparin (LOVENOX) injection 40 mg (40 mg Subcutaneous Given 11/15/19 2209)  0.9 %  sodium chloride infusion ( Intravenous New Bag/Given 11/16/19 1113)  potassium chloride 10 mEq in 100 mL IVPB (10 mEq Intravenous New Bag/Given 11/16/19 1443)  promethazine (PHENERGAN) injection 12.5 mg (12.5 mg Intravenous Given 11/16/19 1147)  LORazepam (ATIVAN) injection 0.5 mg (0.5 mg Intravenous Given 11/16/19 1350)  sodium chloride 0.9 % bolus 1,000 mL (0 mLs Intravenous Stopped 11/15/19 1411)  droperidol (INAPSINE) 2.5 MG/ML injection 1.25 mg (1.25 mg Intravenous Given 11/15/19 1117)  diphenhydrAMINE (BENADRYL) injection 25 mg (25 mg Intravenous Given 11/15/19 1115)  alum & mag hydroxide-simeth (MAALOX/MYLANTA) 200-200-20 MG/5ML suspension 30 mL (30 mLs Oral Given 11/15/19 1409)  droperidol (INAPSINE) 2.5 MG/ML injection 1.25 mg (1.25 mg Intravenous Given 11/15/19 1347)  famotidine (PEPCID) IVPB 20 mg premix (0 mg Intravenous Stopped 11/15/19 1658)  metoCLOPramide (REGLAN) injection 5 mg (5 mg Intravenous Given 11/15/19 1554)  sodium chloride 0.9 % bolus 1,000 mL (0 mLs Intravenous Stopped 11/15/19 1704)  promethazine (PHENERGAN) injection 25 mg (25 mg  Intravenous Given 11/15/19 1553)  sodium chloride 0.9 % bolus 1,000 mL ( Intravenous Stopped 11/15/19 1804)  fentaNYL (SUBLIMAZE) injection 25 mcg (25 mcg Intravenous Given 11/15/19 2228)  magnesium sulfate  IVPB 2 g 50 mL (2 g Intravenous New Bag/Given 11/16/19 0786)    ED Course  I have reviewed the triage vital signs and the nursing notes.  Pertinent labs & imaging results that were available during my care of the patient were reviewed by me and considered in my medical decision making (see chart for details).    MDM Rules/Calculators/A&P                          59 yoF with a chief complaints of nausea vomiting and diffuse abdominal pain.  Patient has a history of cyclic vomiting syndrome and this feels the same.  No fevers no new areas of pain.  Feels exactly the same as it typically does.  Unable to tolerate her antiemetics at home.  We will attempt to treat her nausea and vomiting we will check lab work bolus IV fluids reassess.  Requiring multiple rounds of antiemetics, tolerating mininal PO but still feels very nauseated.  Signed out to Dr. Darl Householder, please see his note for further details of care in the ED.  CRITICAL CARE Performed by: Cecilio Asper   Total critical care time: 35 minutes  Critical care time was exclusive of separately billable procedures and treating other patients.  Critical care was necessary to treat or prevent imminent or life-threatening deterioration.  Critical care was time spent personally by me on the following activities: development of treatment plan with patient and/or surrogate as well as nursing, discussions with consultants, evaluation of patient's response to treatment, examination of patient, obtaining history from patient or surrogate, ordering and performing treatments and interventions, ordering and review of laboratory studies, ordering and review of radiographic studies, pulse oximetry and re-evaluation of patient's condition.  The patients  results and plan were reviewed and discussed.   Any x-rays performed were independently reviewed by myself.   Differential diagnosis were considered with the presenting HPI.  Medications  ondansetron (ZOFRAN) tablet 4 mg ( Oral See Alternative 11/16/19 0958)    Or  ondansetron (ZOFRAN) injection 4 mg (4 mg Intravenous Given 11/16/19 0958)  enoxaparin (LOVENOX) injection 40 mg (40 mg Subcutaneous Given 11/15/19 2209)  0.9 %  sodium chloride infusion ( Intravenous New Bag/Given 11/16/19 1113)  potassium chloride 10 mEq in 100 mL IVPB (10 mEq Intravenous New Bag/Given 11/16/19 1443)  promethazine (PHENERGAN) injection 12.5 mg (12.5 mg Intravenous Given 11/16/19 1147)  LORazepam (ATIVAN) injection 0.5 mg (0.5 mg Intravenous Given 11/16/19 1350)  sodium chloride 0.9 % bolus 1,000 mL (0 mLs Intravenous Stopped 11/15/19 1411)  droperidol (INAPSINE) 2.5 MG/ML injection 1.25 mg (1.25 mg Intravenous Given 11/15/19 1117)  diphenhydrAMINE (BENADRYL) injection 25 mg (25 mg Intravenous Given 11/15/19 1115)  alum & mag hydroxide-simeth (MAALOX/MYLANTA) 200-200-20 MG/5ML suspension 30 mL (30 mLs Oral Given 11/15/19 1409)  droperidol (INAPSINE) 2.5 MG/ML injection 1.25 mg (1.25 mg Intravenous Given 11/15/19 1347)  famotidine (PEPCID) IVPB 20 mg premix (0 mg Intravenous Stopped 11/15/19 1658)  metoCLOPramide (REGLAN) injection 5 mg (5 mg Intravenous Given 11/15/19 1554)  sodium chloride 0.9 % bolus 1,000 mL (0 mLs Intravenous Stopped 11/15/19 1704)  promethazine (PHENERGAN) injection 25 mg (25 mg Intravenous Given 11/15/19 1553)  sodium chloride 0.9 % bolus 1,000 mL ( Intravenous Stopped 11/15/19 1804)  fentaNYL (SUBLIMAZE) injection 25 mcg (25 mcg Intravenous Given 11/15/19 2228)  magnesium sulfate IVPB 2 g 50 mL (2 g Intravenous New Bag/Given 11/16/19 0641)    Vitals:   11/16/19 0142 11/16/19  7829 11/16/19 1000 11/16/19 1319  BP: 100/63 128/80 (!) 131/95 (!) 140/94  Pulse: 68 78 81 82  Resp: 17 15 16 14   Temp: 98.4 F  (36.9 C) 97.9 F (36.6 C) 98 F (36.7 C) (!) 97.3 F (36.3 C)  TempSrc: Oral Oral    SpO2: 100% 100% 100% 100%    Final diagnoses:  Cannabis hyperemesis syndrome concurrent with and due to cannabis abuse [facility])      Final Clinical Impression(s) / ED Diagnoses Final diagnoses:  Cannabis hyperemesis syndrome concurrent with and due to cannabis abuse [facility])    Rx / DC Orders ED Discharge Orders    None       Deno Etienne, DO 11/16/19 1503

## 2019-11-15 NOTE — ED Triage Notes (Signed)
Vomiting and dizziness since this morning. Hx of cyclical vomiting.

## 2019-11-15 NOTE — Progress Notes (Addendum)
Was called by ED physician for transfer from [facility] for 29 year old female history of cyclic vomiting syndrome presenting to the ED with intractable nausea and vomiting.  Work-up unremarkable.  Comprehensive metabolic profile unremarkable.  CBC with a white count of 10.6 otherwise within normal limits.  Urinalysis nitrite negative, leukocytes negative.  SARS Covid 2 PCR pending.  Patient given 3 L of IV fluids, multiple antiemetics tried without any significant improvement in symptoms.  Patient with still ongoing nausea and vomiting request for admission for intractable nausea and vomiting likely secondary to cannabis hyperemesis per ED physician and dehydration.  Patient accepted to [facility].  Chest x-ray and abdominal films pending.  No charge.

## 2019-11-16 DIAGNOSIS — E876 Hypokalemia: Secondary | ICD-10-CM | POA: Diagnosis present

## 2019-11-16 DIAGNOSIS — K3184 Gastroparesis: Secondary | ICD-10-CM | POA: Diagnosis present

## 2019-11-16 DIAGNOSIS — Z20822 Contact with and (suspected) exposure to covid-19: Secondary | ICD-10-CM | POA: Diagnosis present

## 2019-11-16 DIAGNOSIS — R112 Nausea with vomiting, unspecified: Secondary | ICD-10-CM | POA: Diagnosis present

## 2019-11-16 DIAGNOSIS — D72829 Elevated white blood cell count, unspecified: Secondary | ICD-10-CM | POA: Diagnosis present

## 2019-11-16 DIAGNOSIS — Z833 Family history of diabetes mellitus: Secondary | ICD-10-CM | POA: Diagnosis not present

## 2019-11-16 DIAGNOSIS — F12188 Cannabis abuse with other cannabis-induced disorder: Secondary | ICD-10-CM | POA: Diagnosis not present

## 2019-11-16 DIAGNOSIS — Z8249 Family history of ischemic heart disease and other diseases of the circulatory system: Secondary | ICD-10-CM | POA: Diagnosis not present

## 2019-11-16 DIAGNOSIS — Z825 Family history of asthma and other chronic lower respiratory diseases: Secondary | ICD-10-CM | POA: Diagnosis not present

## 2019-11-16 LAB — BASIC METABOLIC PANEL
Anion gap: 13 (ref 5–15)
BUN: 9 mg/dL (ref 6–20)
CO2: 19 mmol/L — ABNORMAL LOW (ref 22–32)
Calcium: 8.9 mg/dL (ref 8.9–10.3)
Chloride: 105 mmol/L (ref 98–111)
Creatinine, Ser: 0.63 mg/dL (ref 0.44–1.00)
GFR calc Af Amer: >60 mL/min (ref >60)
GFR calc non Af Amer: >60 mL/min (ref >60)
Glucose, Bld: 120 mg/dL — ABNORMAL HIGH (ref 70–99)
Potassium: 3.2 mmol/L — ABNORMAL LOW (ref 3.5–5.1)
Sodium: 137 mmol/L (ref 135–145)

## 2019-11-16 LAB — CBC
HCT: 38.3 % (ref 36.0–46.0)
Hemoglobin: 13.5 g/dL (ref 12.0–15.0)
MCH: 33.4 pg (ref 26.0–34.0)
MCHC: 35.2 g/dL (ref 30.0–36.0)
MCV: 94.8 fL (ref 80.0–100.0)
Platelets: 199 10*3/uL (ref 150–400)
RBC: 4.04 MIL/uL (ref 3.87–5.11)
RDW: 11.9 % (ref 11.5–15.5)
WBC: 14.5 10*3/uL — ABNORMAL HIGH (ref 4.0–10.5)
nRBC: 0 % (ref 0.0–0.2)

## 2019-11-16 LAB — HEPATIC FUNCTION PANEL
ALT: 23 U/L (ref 0–44)
AST: 28 U/L (ref 15–41)
Albumin: 4.3 g/dL (ref 3.5–5.0)
Alkaline Phosphatase: 54 U/L (ref 38–126)
Bilirubin, Direct: 0.1 mg/dL (ref 0.0–0.2)
Indirect Bilirubin: 0.7 mg/dL (ref 0.3–0.9)
Total Bilirubin: 0.8 mg/dL (ref 0.3–1.2)
Total Protein: 7 g/dL (ref 6.5–8.1)

## 2019-11-16 LAB — HIV ANTIBODY (ROUTINE TESTING W REFLEX): HIV Screen 4th Generation wRfx: NONREACTIVE

## 2019-11-16 MED ORDER — PROMETHAZINE HCL 25 MG/ML IJ SOLN
12.5000 mg | Freq: Four times a day (QID) | INTRAMUSCULAR | Status: DC | PRN
  Administered 2019-11-16 – 2019-11-18 (×6): 12.5 mg via INTRAVENOUS
  Filled 2019-11-16 (×6): qty 1

## 2019-11-16 MED ORDER — MAGNESIUM SULFATE 2 GM/50ML IV SOLN
2.0000 g | Freq: Once | INTRAVENOUS | Status: AC
  Administered 2019-11-16: 2 g via INTRAVENOUS
  Filled 2019-11-16: qty 50

## 2019-11-16 MED ORDER — POTASSIUM CHLORIDE CRYS ER 20 MEQ PO TBCR
40.0000 meq | EXTENDED_RELEASE_TABLET | Freq: Once | ORAL | Status: DC
  Filled 2019-11-16: qty 2

## 2019-11-16 MED ORDER — POTASSIUM CHLORIDE 10 MEQ/100ML IV SOLN
10.0000 meq | INTRAVENOUS | Status: AC
  Administered 2019-11-16 (×4): 10 meq via INTRAVENOUS
  Filled 2019-11-16 (×3): qty 100

## 2019-11-16 MED ORDER — LORAZEPAM 2 MG/ML IJ SOLN
0.5000 mg | Freq: Four times a day (QID) | INTRAMUSCULAR | Status: DC | PRN
  Administered 2019-11-16 – 2019-11-17 (×2): 0.5 mg via INTRAVENOUS
  Filled 2019-11-16 (×2): qty 1

## 2019-11-16 NOTE — Progress Notes (Addendum)
PROGRESS NOTE  Debbie Hughes:[id number] DOB: 1990/06/08 DOA: 11/15/2019 PCP: [name], PA-C   LOS: 0 days   Brief Narrative / Interim history: 29 year old female with previous admissions for cyclical vomiting syndrome likely due to marijuana, came to the ER with intractable nausea and vomiting.  She failed conservative management the ED and was admitted to the hospital.  Subjective / 24h Interval events: Has persistent nausea and had an episode of emesis overnight last night.  She tells me that she gets this nausea and vomiting pretty much every 2-4 months.  Assessment & Plan: Principal Problem Intractable nausea vomiting-patient has normal LFTs, abdomen is benign, likely due to tetrahydrocannabinol use.  She still has refractory symptoms this morning, continue IV fluids and symptomatic management.  Follow-up clear liquid diet however she is not tolerating that very well.  Check cortisol and TSH in the morning for completeness  Active Problems History of marijuana use-she only uses CBD per patient, she is determined to quit altogether to see if that helps her cyclical nausea and vomiting  Mild leukocytosis-continue to monitor, likely due to #1. Afebrile  Hypokalemia/hypomagnesemia-due to nausea and vomiting, GI losses, repleted and will recheck tomorrow morning.  Scheduled Meds: . enoxaparin (LOVENOX) injection  40 mg Subcutaneous Q24H   Continuous Infusions: . sodium chloride 75 mL/hr at 11/15/19 2203  . potassium chloride     PRN Meds:.ondansetron **OR** ondansetron (ZOFRAN) IV  Diet Orders (From admission, onward)    Start     Ordered   11/15/19 2118  Diet clear liquid Room service appropriate? Yes; Fluid consistency: Thin  Diet effective now       Question Answer Comment  Room service appropriate? Yes   Fluid consistency: Thin      11/15/19 2118          DVT prophylaxis: enoxaparin (LOVENOX) injection 40 mg Start: 11/15/19 2200     Code Status: Full  Code  Family Communication: no family at bedside   Status is: Observation  The patient will require care spanning > 2 midnights and should be moved to inpatient because: IV treatments appropriate due to intensity of illness or inability to take PO  Dispo: The patient is from: Home              Anticipated d/c is to: Home              Anticipated d/c date is: 2 days              Patient currently is not medically stable to d/c.  Consultants:  None   Procedures:  None   Microbiology  None   Antimicrobials: None     Objective: Vitals:   11/15/19 2111 11/16/19 0142 11/16/19 0517 11/16/19 1000  BP: 113/60 100/63 128/80 (!) 131/95  Pulse: 89 68 78 81  Resp: 18 17 15 16   Temp: 98 F (36.7 C) 98.4 F (36.9 C) 97.9 F (36.6 C) 98 F (36.7 C)  TempSrc: Oral Oral Oral   SpO2: 100% 100% 100% 100%    Intake/Output Summary (Last 24 hours) at 11/16/2019 1109 Last data filed at 11/16/2019 0852 Gross per 24 hour  Intake 3765.44 ml  Output 3100 ml  Net 665.44 ml   There were no vitals filed for this visit.  Examination:  Constitutional: NAD Eyes: no scleral icterus ENMT: Mucous membranes are moist.  Neck: normal, supple Respiratory: clear to auscultation bilaterally, no wheezing, no crackles. Normal respiratory effort. No accessory muscle use.  Cardiovascular:  Regular rate and rhythm, no murmurs / rubs / gallops. No LE edema. Good peripheral pulses Abdomen: non distended, no tenderness. Bowel sounds positive.  Musculoskeletal: no clubbing / cyanosis.  Skin: no rashes Neurologic: CN 2-12 grossly intact. Strength 5/5 in all 4.   Data Reviewed: I have independently reviewed following labs and imaging studies   CBC: Recent Labs  Lab 11/15/19 1121 11/16/19 0528  WBC 10.6* 14.5*  NEUTROABS 8.8*  --   HGB 13.9 13.5  HCT 39.7 38.3  MCV 95.4 94.8  PLT 219 921   Basic Metabolic Panel: Recent Labs  Lab 11/15/19 1121 11/16/19 0528  NA 139 137  K 3.5 3.2*  CL 105  105  CO2 20* 19*  GLUCOSE 161* 120*  BUN 18 9  CREATININE 0.69 0.63  CALCIUM 9.3 8.9  MG 1.6*  --    Liver Function Tests: Recent Labs  Lab 11/15/19 1121 11/16/19 0528  AST 32 28  ALT 24 23  ALKPHOS 57 54  BILITOT 0.4 0.8  PROT 7.5 7.0  ALBUMIN 4.5 4.3   Coagulation Profile: No results for input(s): INR, PROTIME in the last 168 hours. HbA1C: No results for input(s): HGBA1C in the last 72 hours. CBG: No results for input(s): GLUCAP in the last 168 hours.  Recent Results (from the past 240 hour(s))  SARS Coronavirus 2 by RT PCR (hospital order, performed in [facility] hospital lab) Nasopharyngeal Nasopharyngeal Swab     Status: None   Collection Time: 11/15/19  5:15 PM   Specimen: Nasopharyngeal Swab  Result Value Ref Range Status   SARS Coronavirus 2 NEGATIVE NEGATIVE Final    Comment: (NOTE) SARS-CoV-2 target nucleic acids are NOT DETECTED.  The SARS-CoV-2 RNA is generally detectable in upper and lower respiratory specimens during the acute phase of infection. The lowest concentration of SARS-CoV-2 viral copies this assay can detect is 250 copies / mL. A negative result does not preclude SARS-CoV-2 infection and should not be used as the sole basis for treatment or other patient management decisions.  A negative result may occur with improper specimen collection / handling, submission of specimen other than nasopharyngeal swab, presence of viral mutation(s) within the areas targeted by this assay, and inadequate number of viral copies (<250 copies / mL). A negative result must be combined with clinical observations, patient history, and epidemiological information.  Fact Sheet for Patients:   StrictlyIdeas.no  Fact Sheet for Healthcare Providers: BankingDealers.co.za  This test is not yet approved or  cleared by the [place] FDA and has been authorized for detection and/or diagnosis of SARS-CoV-2 by FDA under  an Emergency Use Authorization (EUA).  This EUA will remain in effect (meaning this test can be used) for the duration of the COVID-19 declaration under Section 564(b)(1) of the Act, 21 U.S.C. section 360bbb-3(b)(1), unless the authorization is terminated or revoked sooner.  Performed at [facility], [address] [place], [place] [address]      Radiology Studies: DG ABD ACUTE 2+V W 1V CHEST  Result Date: 11/15/2019 CLINICAL DATA:  Vomiting, lower abdominal pain EXAM: DG ABDOMEN ACUTE W/ 1V CHEST COMPARISON:  02/13/2019 FINDINGS: Supine and upright frontal views of the abdomen as well as an upright frontal view of the chest are obtained. Cardiac silhouette is unremarkable. No airspace disease, effusion, or pneumothorax. Bowel gas pattern is unremarkable with no obstruction or ileus. 3 mm calcification left mid abdomen consistent with known left renal calculus. IUD overlies the pelvis. 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 No acute bony abnormalities. IMPRESSION: 1. Stable 3 mm left renal calculus. 2. Unremarkable bowel gas pattern. 3. No acute intrathoracic process. Electronically Signed   By: Randa Ngo M.D.   On: 11/15/2019 18:34   Marzetta Board, MD, PhD [facility]  Between 7 am - 7 pm I am available, please contact me via Amion or Securechat  Between 7 pm - 7 am I am not available, please contact night coverage MD/APP via Amion

## 2019-11-17 ENCOUNTER — Other Ambulatory Visit: Payer: Self-pay

## 2019-11-17 LAB — MAGNESIUM: Magnesium: 2 mg/dL (ref 1.7–2.4)

## 2019-11-17 LAB — COMPREHENSIVE METABOLIC PANEL
ALT: 22 U/L (ref 0–44)
AST: 24 U/L (ref 15–41)
Albumin: 4.3 g/dL (ref 3.5–5.0)
Alkaline Phosphatase: 51 U/L (ref 38–126)
Anion gap: 11 (ref 5–15)
BUN: 14 mg/dL (ref 6–20)
CO2: 21 mmol/L — ABNORMAL LOW (ref 22–32)
Calcium: 8.9 mg/dL (ref 8.9–10.3)
Chloride: 104 mmol/L (ref 98–111)
Creatinine, Ser: 0.5 mg/dL (ref 0.44–1.00)
GFR calc Af Amer: >60 mL/min (ref >60)
GFR calc non Af Amer: >60 mL/min (ref >60)
Glucose, Bld: 100 mg/dL — ABNORMAL HIGH (ref 70–99)
Potassium: 3.2 mmol/L — ABNORMAL LOW (ref 3.5–5.1)
Sodium: 136 mmol/L (ref 135–145)
Total Bilirubin: 1.3 mg/dL — ABNORMAL HIGH (ref 0.3–1.2)
Total Protein: 7.2 g/dL (ref 6.5–8.1)

## 2019-11-17 LAB — CBC
HCT: 37.1 % (ref 36.0–46.0)
Hemoglobin: 12.8 g/dL (ref 12.0–15.0)
MCH: 32.9 pg (ref 26.0–34.0)
MCHC: 34.5 g/dL (ref 30.0–36.0)
MCV: 95.4 fL (ref 80.0–100.0)
Platelets: 188 10*3/uL (ref 150–400)
RBC: 3.89 MIL/uL (ref 3.87–5.11)
RDW: 12.1 % (ref 11.5–15.5)
WBC: 9.8 10*3/uL (ref 4.0–10.5)
nRBC: 0 % (ref 0.0–0.2)

## 2019-11-17 LAB — PHOSPHORUS: Phosphorus: 2 mg/dL — ABNORMAL LOW (ref 2.5–4.6)

## 2019-11-17 LAB — TSH: TSH: 0.965 u[IU]/mL (ref 0.350–4.500)

## 2019-11-17 LAB — CORTISOL: Cortisol, Plasma: 29.7 ug/dL

## 2019-11-17 MED ORDER — SODIUM CHLORIDE 0.9 % IV SOLN: INTRAVENOUS | Status: DC

## 2019-11-17 MED ORDER — POTASSIUM CHLORIDE 2 MEQ/ML IV SOLN
INTRAVENOUS | Status: AC
  Filled 2019-11-17 (×3): qty 1000

## 2019-11-17 MED ORDER — POTASSIUM CHLORIDE 10 MEQ/100ML IV SOLN
10.0000 meq | INTRAVENOUS | Status: AC
  Administered 2019-11-17 (×3): 10 meq via INTRAVENOUS
  Filled 2019-11-17 (×2): qty 100

## 2019-11-17 MED ORDER — PROCHLORPERAZINE EDISYLATE 10 MG/2ML IJ SOLN: 10.0000 mg | Freq: Four times a day (QID) | INTRAMUSCULAR | Status: DC | PRN

## 2019-11-17 NOTE — Progress Notes (Signed)
PROGRESS NOTE    Debbie Hughes  DXA:[id number] DOB: July 07, 1990 DOA: 11/15/2019 PCP: [name], PA-C   Chief Complaint  Patient presents with  . Emesis    Brief Narrative:  29 year old female with previous admissions for cyclical vomiting syndrome likely due to marijuana, came to the ER with intractable nausea and vomiting.  She failed conservative management the ED and was admitted to the hospital.  Assessment & Plan:   Principal Problem:   Intractable nausea and vomiting Active Problems:   Nausea & vomiting  Intractable nausea vomiting-patient has normal LFTs, abdomen is benign, likely due to tetrahydrocannabinol use.   Symptoms are refractory today Continue liquids, advance diet as tolerated Prn antiemetics Normal cortisol and TSH If she does not improve, will consider GI consult - last visit, gastric emptying study was planned, but was not done as she wasn't able to tolerate PO  History of marijuana use-she only uses CBD per patient, she is determined to quit altogether to see if that helps her cyclical nausea and vomiting  Mild leukocytosis- resolved  Hypokalemia/hypomagnesemia- replace and follow   DVT prophylaxis: lovenox Code Status: full  Family Communication: none at bedside Disposition:   Status is: Inpatient  Remains inpatient appropriate because:Inpatient level of care appropriate due to severity of illness   Dispo:  Patient From: Home  Planned Disposition: Home  Expected discharge date: 11/18/19  Medically stable for discharge: No        Consultants:   none  Procedures:  none  Antimicrobials: Anti-infectives (From admission, onward)   None         Subjective: Continued inability to tolerate PO  Objective: Vitals:   11/16/19 2125 11/17/19 0620 11/17/19 0709 11/17/19 1334  BP: 130/85 (!) 135/92  129/75  Pulse: 72 79  71  Resp: 16 16  18   Temp: 98.2 F (36.8 C) 98.8 F (37.1 C)  99.2 F (37.3 C)  TempSrc: Oral  Oral  Oral  SpO2: 100% 100%  100%  Weight:   46 kg   Height:   5\' 3"  (1.6 m)     Intake/Output Summary (Last 24 hours) at 11/17/2019 1507 Last data filed at 11/17/2019 0623 Gross per 24 hour  Intake 1116.32 ml  Output 200 ml  Net 916.32 ml   Filed Weights   11/17/19 0709  Weight: 46 kg    Examination:  General exam: Appears calm and comfortable  Respiratory system: Clear to auscultation. Respiratory effort normal. Cardiovascular system: S1 & S2 heard, RRR. Gastrointestinal system: Abdomen is nondistended, soft and nontender.  Central nervous system: Alert and oriented. No focal neurological deficits. Extremities: no LEE Skin: No rashes, lesions or ulcers Psychiatry: Judgement and insight appear normal. Mood & affect appropriate.     Data Reviewed: I have personally reviewed following labs and imaging studies  CBC: Recent Labs  Lab 11/15/19 1121 11/16/19 0528 11/17/19 0555  WBC 10.6* 14.5* 9.8  NEUTROABS 8.8*  --   --   HGB 13.9 13.5 12.8  HCT 39.7 38.3 37.1  MCV 95.4 94.8 95.4  PLT 219 199 209    Basic Metabolic Panel: Recent Labs  Lab 11/15/19 1121 11/16/19 0528 11/17/19 0555  NA 139 137 136  K 3.5 3.2* 3.2*  CL 105 105 104  CO2 20* 19* 21*  GLUCOSE 161* 120* 100*  BUN 18 9 14   CREATININE 0.69 0.63 0.50  CALCIUM 9.3 8.9 8.9  MG 1.6*  --  2.0  PHOS  --   --  2.0*  GFR: Estimated Creatinine Clearance: 75.3 mL/min (by C-G formula based on SCr of 0.5 mg/dL).  Liver Function Tests: Recent Labs  Lab 11/15/19 1121 11/16/19 0528 11/17/19 0555  AST 32 28 24  ALT 24 23 22   ALKPHOS 57 54 51  BILITOT 0.4 0.8 1.3*  PROT 7.5 7.0 7.2  ALBUMIN 4.5 4.3 4.3    CBG: No results for input(s): GLUCAP in the last 168 hours.   Recent Results (from the past 240 hour(s))  SARS Coronavirus 2 by RT PCR (hospital order, performed in [facility] hospital lab) Nasopharyngeal Nasopharyngeal Swab     Status: None   Collection Time: 11/15/19  5:15 PM    Specimen: Nasopharyngeal Swab  Result Value Ref Range Status   SARS Coronavirus 2 NEGATIVE NEGATIVE Final    Comment: (NOTE) SARS-CoV-2 target nucleic acids are NOT DETECTED.  The SARS-CoV-2 RNA is generally detectable in upper and lower respiratory specimens during the acute phase of infection. The lowest concentration of SARS-CoV-2 viral copies this assay can detect is 250 copies / mL. Chanc Kervin negative result does not preclude SARS-CoV-2 infection and should not be used as the sole basis for treatment or other patient management decisions.  Arlester Keehan negative result may occur with improper specimen collection / handling, submission of specimen other than nasopharyngeal swab, presence of viral mutation(s) within the areas targeted by this assay, and inadequate number of viral copies (<250 copies / mL). Ardel Jagger negative result must be combined with clinical observations, patient history, and epidemiological information.  Fact Sheet for Patients:   StrictlyIdeas.no  Fact Sheet for Healthcare Providers: BankingDealers.co.za  This test is not yet approved or  cleared by the [place] FDA and has been authorized for detection and/or diagnosis of SARS-CoV-2 by FDA under an Emergency Use Authorization (EUA).  This EUA will remain in effect (meaning this test can be used) for the duration of the COVID-19 declaration under Section 564(b)(1) of the Act, 21 U.S.C. section 360bbb-3(b)(1), unless the authorization is terminated or revoked sooner.  Performed at [facility], [address] [place], [place] [address]          Radiology Studies: DG ABD ACUTE 2+V W 1V CHEST  Result Date: 11/15/2019 CLINICAL DATA:  Vomiting, lower abdominal pain EXAM: DG ABDOMEN ACUTE W/ 1V CHEST COMPARISON:  02/13/2019 FINDINGS: Supine and upright frontal views of the abdomen as well as an upright frontal view of the chest are obtained. Cardiac silhouette is  unremarkable. No airspace disease, effusion, or pneumothorax. Bowel gas pattern is unremarkable with no obstruction or ileus. 3 mm calcification left mid abdomen consistent with known left renal calculus. IUD overlies the pelvis. 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 No acute bony abnormalities. IMPRESSION: 1. Stable 3 mm left renal calculus. 2. Unremarkable bowel gas pattern. 3. No acute intrathoracic process. Electronically Signed   By: Randa Ngo M.D.   On: 11/15/2019 18:34        Scheduled Meds: . enoxaparin (LOVENOX) injection  40 mg Subcutaneous Q24H   Continuous Infusions: . sodium chloride 75 mL/hr at 11/17/19 0619     LOS: 1 day    Time spent: over 30 min    Fayrene Helper, MD [facility]   To contact the attending provider between 7A-7P or the covering provider during after hours 7P-7A, please log into the web site [email or web address] and access using universal [place] password for that web site. If you do not have the password, please call the hospital  operator.  11/17/2019, 3:07 PM

## 2019-11-17 NOTE — Progress Notes (Signed)
Pt requested haldol for vomiting stating it has worked in the past for her. Paged M. Sharlet Salina on call to notify of patient's request and to request continuous IVF order.   [place] with melanie, rounding dr can order correct medication dapridol if necessary.                   Received continuous IVF orders and alternative antiemetic.

## 2019-11-18 ENCOUNTER — Inpatient Hospital Stay (HOSPITAL_COMMUNITY): Payer: BC Managed Care – PPO

## 2019-11-18 LAB — CBC WITH DIFFERENTIAL/PLATELET
Abs Immature Granulocytes: 0.02 10*3/uL (ref 0.00–0.07)
Basophils Absolute: 0 10*3/uL (ref 0.0–0.1)
Basophils Relative: 0 %
Eosinophils Absolute: 0 10*3/uL (ref 0.0–0.5)
Eosinophils Relative: 0 %
HCT: 39.5 % (ref 36.0–46.0)
Hemoglobin: 13.3 g/dL (ref 12.0–15.0)
Immature Granulocytes: 0 %
Lymphocytes Relative: 26 %
Lymphs Abs: 2.1 10*3/uL (ref 0.7–4.0)
MCH: 32.7 pg (ref 26.0–34.0)
MCHC: 33.7 g/dL (ref 30.0–36.0)
MCV: 97.1 fL (ref 80.0–100.0)
Monocytes Absolute: 0.5 10*3/uL (ref 0.1–1.0)
Monocytes Relative: 7 %
Neutro Abs: 5.3 10*3/uL (ref 1.7–7.7)
Neutrophils Relative %: 67 %
Platelets: 175 10*3/uL (ref 150–400)
RBC: 4.07 MIL/uL (ref 3.87–5.11)
RDW: 11.8 % (ref 11.5–15.5)
WBC: 7.9 10*3/uL (ref 4.0–10.5)
nRBC: 0 % (ref 0.0–0.2)

## 2019-11-18 LAB — COMPREHENSIVE METABOLIC PANEL
ALT: 20 U/L (ref 0–44)
AST: 19 U/L (ref 15–41)
Albumin: 4.1 g/dL (ref 3.5–5.0)
Alkaline Phosphatase: 48 U/L (ref 38–126)
Anion gap: 12 (ref 5–15)
BUN: 19 mg/dL (ref 6–20)
CO2: 21 mmol/L — ABNORMAL LOW (ref 22–32)
Calcium: 8.8 mg/dL — ABNORMAL LOW (ref 8.9–10.3)
Chloride: 103 mmol/L (ref 98–111)
Creatinine, Ser: 0.58 mg/dL (ref 0.44–1.00)
GFR calc Af Amer: >60 mL/min (ref >60)
GFR calc non Af Amer: >60 mL/min (ref >60)
Glucose, Bld: 85 mg/dL (ref 70–99)
Potassium: 3.5 mmol/L (ref 3.5–5.1)
Sodium: 136 mmol/L (ref 135–145)
Total Bilirubin: 1.2 mg/dL (ref 0.3–1.2)
Total Protein: 6.9 g/dL (ref 6.5–8.1)

## 2019-11-18 LAB — MAGNESIUM: Magnesium: 1.9 mg/dL (ref 1.7–2.4)

## 2019-11-18 LAB — PHOSPHORUS: Phosphorus: 2.3 mg/dL — ABNORMAL LOW (ref 2.5–4.6)

## 2019-11-18 MED ORDER — PROMETHAZINE HCL 25 MG PO TABS
25.0000 mg | ORAL_TABLET | Freq: Four times a day (QID) | ORAL | 0 refills | Status: DC | PRN
Start: 2019-11-18 — End: 2019-11-21

## 2019-11-18 MED ORDER — PROMETHAZINE HCL 25 MG RE SUPP: 25.0000 mg | Freq: Four times a day (QID) | RECTAL | 0 refills | Status: DC | PRN

## 2019-11-18 MED ORDER — PANTOPRAZOLE SODIUM 40 MG IV SOLR
40.0000 mg | INTRAVENOUS | Status: DC
  Administered 2019-11-18: 40 mg via INTRAVENOUS
  Filled 2019-11-18: qty 40

## 2019-11-18 MED ORDER — TECHNETIUM TC 99M SULFUR COLLOID
2.8000 | Freq: Once | INTRAVENOUS | Status: AC | PRN
  Administered 2019-11-18: 2.8 via INTRAVENOUS

## 2019-11-18 NOTE — Discharge Summary (Addendum)
Physician Discharge Summary  Debbie Hughes ZHG:[id number] DOB: 02-16-1991 DOA: 11/15/2019  PCP: [name], PA-C  Admit date: 11/15/2019 Discharge date: 11/18/2019  Time spent: 40 minutes  Recommendations for Outpatient Follow-up:  1. Follow outpatient CBC/CMP 2. Gastric emptying study delayed - consistent with gastroparesis - symptoms improved today, Dr. Cristina Gong discussed dietary changes and I reiterated these - consider prokinetic agents if she worsens again  3. 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 She failed conservative management the ED and was admitted to the hospital.  She was admitted with nausea/vomting.  Gastric emptying study showed confirmed gastroparesis.  GI was consulted and discussed dietary changes.   See below for additional details   Hospital Course:  Intractable nausea vomiting-patient has normal LFTs, abdomen is benign, likely due to tetrahydrocannabinol use.  Symptoms are refractory today Continue liquids, advance diet as tolerated Prn antiemetics Normal cortisol and TSH.  A1c in April was 5.4, BG's wnl. Gi suspects idiopathic gastric dysmotility Gastric emptying study supports gastroparesis - discussed dietary changes - she tolerated soft diet prior to discharge - consider prokinetic agent if recurrent  History of marijuana use-she only uses CBD per patient, she is determined to quit altogether to see if that helps her cyclical nausea and vomiting  Mild leukocytosis- resolved  Hypokalemia/hypomagnesemia- replace and follow   Procedures:  Gastric emptying  study  Consultations:  GI  Discharge Exam: Vitals:   11/17/19 2144 11/18/19 0603  BP: (!) 141/85 (!) 138/93  Pulse: (!) 59 64  Resp: 18 18  Temp: 98.7 F (37.1 C) 98.2 F (36.8 C)  SpO2: 100% 100%   Asking to go home after gastric emptying study Feels better  General: No acute distress. Cardiovascular: Heart sounds show Jedadiah Abdallah regular rate, and rhythm.  Lungs: Clear to auscultation bilaterally  Abdomen: Soft, nontender, nondistended Neurological: Alert and oriented 3. Moves all extremities 4 . Cranial nerves II through XII grossly intact. Skin: Warm and dry. No rashes or lesions. Extremities: No clubbing or cyanosis. No edema.  Discharge Instructions   Discharge Instructions    Call MD for:  difficulty breathing, headache or visual disturbances   Complete by: As directed    Call MD for:  extreme fatigue   Complete by: As directed    Call MD for:  hives   Complete by: As directed    Call MD for:  persistant dizziness or light-headedness   Complete by: As directed    Call MD for:  persistant nausea and vomiting   Complete by: As directed    Call MD for:  redness, tenderness, or signs of infection (pain, swelling, redness, odor or green/yellow discharge around incision site)   Complete by: As directed    Call MD for:  severe uncontrolled pain   Complete by: As directed    Call MD for:  temperature >100.4   Complete by: As directed    Diet - low sodium heart healthy   Complete by: As directed    Discharge instructions   Complete by: As directed    You were admitted for nausea and vomiting.  Dr. 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  Your gastric emptying study is still pending.  Please follow these results with Dr. Cristina Gong or your PCP.  Please follow the diet as recommended by Dr. Cristina Gong.  Follow up in clinic with Dr. Cristina Gong.  If you don't hear from his clinic by Monday, please call.  Return for new, recurrent, or worsening symptoms.  Please  ask your PCP to request records from this hospitalization so they know what was done and what the next steps will be.   Increase activity slowly   Complete by: As directed      Allergies as of 11/18/2019      Reactions   Penicillins Rash   Has patient had Darryll Raju PCN reaction causing immediate rash, facial/tongue/throat swelling, SOB or lightheadedness with hypotension: yes Has patient had Derwin Reddy PCN reaction causing severe rash involving mucus membranes or skin necrosis: no Has patient had Vashti Bolanos PCN reaction that required hospitalization no Has patient had Artha Stavros PCN reaction occurring within the last 10 years: yes If all of the above answers are "NO", then may proceed with Cephalosporin use.      Medication List    STOP taking these medications   promethazine 50 MG tablet Commonly known as: PHENERGAN Replaced by: promethazine 25 MG suppository     TAKE these medications   pantoprazole 40 MG tablet Commonly known as: Protonix Take 1 tablet (40 mg total) by mouth daily.   promethazine 25 MG suppository Commonly known as: Phenergan Place 1 suppository (25 mg total) rectally every 6 (six) hours as needed for nausea. Replaces: promethazine 50 MG tablet   promethazine 25 MG tablet Commonly known as: PHENERGAN Take 1 tablet (25 mg total) by mouth every 6 (six) hours as needed for up to 20 doses for nausea or vomiting.      Allergies  Allergen Reactions   Penicillins Rash    Has patient had Uziel Covault PCN reaction causing immediate rash, facial/tongue/throat swelling, SOB or lightheadedness with hypotension: yes Has patient had Sanayah Munro PCN reaction causing severe rash involving mucus membranes or skin necrosis: no Has patient had Gabryela Kimbrell PCN reaction that required hospitalization no Has patient had Olivine Hiers PCN reaction occurring within the last 10 years: yes If all of the above answers are "NO", then may proceed with Cephalosporin use.     Follow-up Information    Ronald Lobo, MD Follow up.   Specialty:  Gastroenterology Why: Please call if you don't hear from Dr. Osborn Coho office by Monday Contact information: [address] [place] [address] [phone]                The results of significant diagnostics from this hospitalization (including imaging, microbiology, ancillary and laboratory) are listed below for reference.    Significant Diagnostic Studies: NM GASTRIC EMPTYING  Result Date: 11/18/2019 CLINICAL DATA:  Nausea and vomiting EXAM: NUCLEAR MEDICINE GASTRIC EMPTYING SCAN TECHNIQUE: After oral ingestion of radiolabeled meal, sequential abdominal images were obtained for 4 hours. Percentage of activity emptying the stomach was calculated at 1 hour, 2 hour, 3 hour, and 4 hours. RADIOPHARMACEUTICALS:  2.8 mCi Tc-55m sulfur colloid in standardized meal COMPARISON:  02/23/2019 FINDINGS: Expected location of the stomach in the left upper quadrant. Ingested meal empties the stomach gradually over the course of the study. 13.0% emptied at 1 hr ( normal >= 10%) 32.0% emptied at 2 hr ( normal >= 40%) 51.0% emptied at 3 hr ( normal >= 70%) 71.0% emptied at 4 hr ( normal >= 90%) IMPRESSION: Delayed  gastric emptying study. Electronically Signed   By: Randa Ngo M.D.   On: 11/18/2019 17:16   DG ABD ACUTE 2+V W 1V CHEST  Result Date: 11/15/2019 CLINICAL DATA:  Vomiting, lower abdominal pain EXAM: DG ABDOMEN ACUTE W/ 1V CHEST COMPARISON:  02/13/2019 FINDINGS: Supine and upright frontal views of the abdomen as well as an upright frontal view of the chest are obtained. Cardiac silhouette is unremarkable. No airspace disease, effusion, or pneumothorax. Bowel gas pattern is unremarkable with no obstruction or ileus. 3 mm calcification left mid abdomen consistent with known left renal calculus. IUD overlies the pelvis. 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 No acute bony abnormalities. IMPRESSION: 1. Stable 3 mm left renal calculus. 2. Unremarkable bowel gas  pattern. 3. No acute intrathoracic process. Electronically Signed   By: Randa Ngo M.D.   On: 11/15/2019 18:34    Microbiology: Recent Results (from the past 240 hour(s))  SARS Coronavirus 2 by RT PCR (hospital order, performed in [facility] hospital lab) Nasopharyngeal Nasopharyngeal Swab     Status: None   Collection Time: 11/15/19  5:15 PM   Specimen: Nasopharyngeal Swab  Result Value Ref Range Status   SARS Coronavirus 2 NEGATIVE NEGATIVE Final    Comment: (NOTE) SARS-CoV-2 target nucleic acids are NOT DETECTED.  The SARS-CoV-2 RNA is generally detectable in upper and lower respiratory specimens during the acute phase of infection. The lowest concentration of SARS-CoV-2 viral copies this assay can detect is 250 copies / mL. Maiko Salais negative result does not preclude SARS-CoV-2 infection and should not be used as the sole basis for treatment or other patient management decisions.  Slyvester Latona negative result may occur with improper specimen collection / handling, submission of specimen other than nasopharyngeal swab, presence of viral mutation(s) within the areas targeted by this assay, and inadequate number of viral copies (<250 copies / mL). Cylie Dor negative result must be combined with clinical observations, patient history, and epidemiological information.  Fact Sheet for Patients:   StrictlyIdeas.no  Fact Sheet for Healthcare Providers: BankingDealers.co.za  This test is not yet approved or  cleared by the [place] FDA and has been authorized for detection and/or diagnosis of SARS-CoV-2 by FDA under an Emergency Use Authorization (EUA).  This EUA will remain in effect (meaning this test can be used) for the duration of the COVID-19 declaration under Section 564(b)(1) of the Act, 21 U.S.C. section 360bbb-3(b)(1), unless the authorization is terminated or revoked sooner.  Performed at [facility], [address]  [place], [place] [address]      Labs: Basic Metabolic Panel: Recent Labs  Lab 11/15/19 1121 11/16/19 0528 11/17/19 0555 11/18/19 0453  NA 139 137 136 136  K 3.5 3.2* 3.2* 3.5  CL 105 105 104 103  CO2 20* 19* 21* 21*  GLUCOSE 161* 120* 100* 85  BUN 18 9 14 19   CREATININE 0.69 0.63 0.50 0.58  CALCIUM 9.3 8.9 8.9 8.8*  MG 1.6*  --  2.0 1.9  PHOS  --   --  2.0* 2.3*   Liver Function Tests: Recent Labs  Lab 11/15/19 1121 11/16/19 0528 11/17/19 0555 11/18/19 0453  AST 32 28 24 19   ALT 24 23 22 20   ALKPHOS 57 54 51 48  BILITOT 0.4 0.8 1.3* 1.2  PROT 7.5 7.0 7.2 6.9  ALBUMIN 4.5 4.3 4.3 4.1   Recent Labs  Lab 11/15/19 1121  LIPASE 27   No results for input(s): AMMONIA in the last 168  hours. CBC: Recent Labs  Lab 11/15/19 1121 11/16/19 0528 11/17/19 0555 11/18/19 0453  WBC 10.6* 14.5* 9.8 7.9  NEUTROABS 8.8*  --   --  5.3  HGB 13.9 13.5 12.8 13.3  HCT 39.7 38.3 37.1 39.5  MCV 95.4 94.8 95.4 97.1  PLT 219 199 188 175   Cardiac Enzymes: No results for input(s): CKTOTAL, CKMB, CKMBINDEX, TROPONINI in the last 168 hours. BNP: BNP (last 3 results) No results for input(s): BNP in the last 8760 hours.  ProBNP (last 3 results) No results for input(s): PROBNP in the last 8760 hours.  CBG: No results for input(s): GLUCAP in the last 168 hours.     Signed:  Fayrene Helper MD.  [facility] 11/18/2019, 6:17 PM

## 2019-11-18 NOTE — Consult Note (Addendum)
Referring Provider: Dr. Melven Sartorius. Primary Care Physician:  [name], PA-C Primary Gastroenterologist:  Althia Forts  Reason for Consultation:  Intractable nausea and vomiting  HPI: Debbie Hughes is a 29 y.o. female with history of cyclic vomiting presenting with nausea and vomiting.  Patient reports intractable nausea and vomiting that started Monday 7/12.  She had numerous episodes of non-bilious, non-bloody emesis which led her to present to the ED.  She was unable to tolerate PO intake despite ED treatment and was admitted.  Patient states she continued to have retching and emesis until yesterday, when she saw some improvement in symptoms.  Currently, she feels somewhat improved, but states she still cannot eat or drink due to nausea and retching.    Patient states she has had intermittent nausea and vomiting since July 2020.  She states it occurs every couple of months.  She takes Phenergan as needed at home.    She denies any dysphagia, hematemesis, GERD, changes in bowel movements, diarrhea, melena, hematochezia.  She has not had a bowel movement since Sunday 7/11 but denies feeling constipated.    She denies changes in appetite or unexplained weight loss.  She states that in between nausea/vomiting episodes, she has a completely normal appetite.  She denies any family history of gastrointestinal malignancies.  Patient denies marijuana use but states she has a vape with CBD oil with THC.    Past Medical History:  Diagnosis Date   Breast mass in female 01/21/06   Breast nodule 04/26/10   Left   BV (bacterial vaginosis) 5.10/11   Candida vaginitis 00/93/81   Cyclical vomiting    Ectopic pregnancy 03/24/2013   Fibroadenoma    H/O varicella    History of chlamydia    Irregular menstrual cycle 07/28/07   LGSIL (low grade squamous intraepithelial lesion) on Pap smear 06/22/07   Vaginal discharge 10/01/07   Vaginitis     Past Surgical History:   Procedure Laterality Date   BREAST CYST EXCISION Left 2007   DILATION AND CURETTAGE OF UTERUS     LAPAROSCOPY N/A 03/24/2013   Procedure: LAPAROSCOPY OPERATIVE;  Surgeon: Betsy Coder, MD;  Location: [facility];  Service: Gynecology;  Laterality: N/A;   Left Salpingectomy  03/24/2013   Ectopic Pregnancy   UNILATERAL SALPINGECTOMY Left 03/24/2013   Procedure: UNILATERAL SALPINGECTOMY;  Surgeon: Betsy Coder, MD;  Location: [facility];  Service: Gynecology;  Laterality: Left;    Prior to Admission medications   Medication Sig Start Date End Date Taking? Authorizing Provider  pantoprazole (PROTONIX) 40 MG tablet Take 1 tablet (40 mg total) by mouth daily. 11/09/18 08/08/19  Antonieta Pert, MD  promethazine (PHENERGAN) 50 MG tablet Take 1 tablet (50 mg total) by mouth every 6 (six) hours as needed for nausea or vomiting. Patient not taking: Reported on 11/15/2019 08/11/19   Arrien, Jimmy Picket, MD    Current Facility-Administered Medications  Medication Dose Route Frequency Provider Last Rate Last Admin   enoxaparin (LOVENOX) injection 40 mg  40 mg Subcutaneous Q24H Rise Patience, MD   40 mg at 11/17/19 2119   lactated ringers 1,000 mL with potassium chloride 20 mEq infusion   Intravenous Continuous Elodia Florence., MD 75 mL/hr at 11/18/19 0600 Rate Verify at 11/18/19 0600   LORazepam (ATIVAN) injection 0.5 mg  0.5 mg Intravenous Q6H PRN Caren Griffins, MD   0.5 mg at 11/17/19 1638   ondansetron (ZOFRAN) tablet 4 mg  4 mg Oral Q6H PRN  Rise Patience, MD       Or   ondansetron [facility]) injection 4 mg  4 mg Intravenous Q6H PRN Rise Patience, MD   4 mg at 11/18/19 2706   prochlorperazine (COMPAZINE) injection 10 mg  10 mg Intravenous Q6H PRN Lang Snow, FNP       promethazine (PHENERGAN) injection 12.5 mg  12.5 mg Intravenous Q6H PRN Caren Griffins, MD   12.5 mg at 11/18/19 0500    Allergies as of 11/15/2019 - Review Complete 11/15/2019  Allergen  Reaction Noted   Penicillins Rash 01/21/2006    Family History  Problem Relation Age of Onset   Asthma Maternal Grandmother    Hypertension Maternal Grandmother    Diabetes Maternal Grandmother    Hypertension Paternal Grandfather    Diabetes Paternal Grandfather     Social History   Socioeconomic History   Marital status: Single    Spouse name: Not on file   Number of children: Not on file   Years of education: Not on file   Highest education level: Not on file  Occupational History   Not on file  Tobacco Use   Smoking status: Former Smoker   Smokeless tobacco: Never Used  Scientific laboratory technician Use: Never used  Substance and Sexual Activity   Alcohol use: Yes    Comment: occasional    Drug use: Not on file    Comment: states she has not smoked marijuana in 2 months   Sexual activity: Yes    Birth control/protection: I.U.D.  Other Topics Concern   Not on file  Social History Narrative   Not on file   Social Determinants of Health   Financial Resource Strain:    Difficulty of Paying Living Expenses:   Food Insecurity:    Worried About Charity fundraiser in the Last Year:    Arboriculturist in the Last Year:   Transportation Needs:    Film/video editor (Medical):    Lack of Transportation (Non-Medical):   Physical Activity:    Days of Exercise per Week:    Minutes of Exercise per Session:   Stress:    Feeling of Stress :   Social Connections:    Frequency of Communication with Friends and Family:    Frequency of Social Gatherings with Friends and Family:    Attends Religious Services:    Active Member of Clubs or Organizations:    Attends Music therapist:    Marital Status:   Intimate Partner Violence:    Fear of Current or Ex-Partner:    Emotionally Abused:    Physically Abused:    Sexually Abused:     Review of Systems: Review of Systems  Constitutional: Negative for chills, fever and weight  loss.  HENT: Negative for hearing loss and tinnitus.   Eyes: Negative for pain and redness.  Respiratory: Negative for cough and shortness of breath.   Cardiovascular: Negative for chest pain and palpitations.  Gastrointestinal: Positive for nausea and vomiting. Negative for abdominal pain, blood in stool, constipation, diarrhea, heartburn and melena.  Genitourinary: Negative for flank pain and hematuria.  Musculoskeletal: Negative for falls and joint pain.  Skin: Negative for itching and rash.  Neurological: Negative for seizures and loss of consciousness.  Endo/Heme/Allergies: Negative for polydipsia. Does not bruise/bleed easily.  Psychiatric/Behavioral: Negative for substance abuse. The patient is not nervous/anxious.     Physical Exam: Vital signs in last 24 hours: Temp:  [98.2  F (36.8 C)-99.2 F (37.3 C)] 98.2 F (36.8 C) (07/15 0603) Pulse Rate:  [59-71] 64 (07/15 0603) Resp:  [18] 18 (07/15 0603) BP: (129-141)/(75-93) 138/93 (07/15 0603) SpO2:  [100 %] 100 % (07/15 0603) Last BM Date: 11/13/19 General:  Lethargic, thin,  Well-developed, pleasant and cooperative in NAD Head: Normocephalic and atraumatic. Eyes: Sclera clear, no icterus.   Conjunctiva pink. Mouth:  No ulcerations or lesions.  Oropharynx pink & moist. Neck:  No masses or thyromegaly. Lungs: Clear throughout to auscultation.   No wheezes, crackles, or rhonchi. No evident respiratory distress. Heart: Regular rate and rhythm; no murmurs, clicks, rubs,  or gallops. Abdomen: Soft, mild epigastric tenderness, nontympanitic, and nondistended. No masses, hepatosplenomegaly or ventral hernias noted. Normal bowel sounds, without bruits, guarding, or rebound.   Msk:  Symmetrical without gross deformities. Pulses: Normal pulses noted. Extremities:  Without clubbing, cyanosis, or edema. Neurologic: Alert and coherent;  grossly normal neurologically. Skin: Intact without significant lesions or rashes. Cervical Nodes: No  significant cervical adenopathy. Psych:  Alert and cooperative. Normal mood and affect.  Intake/Output from previous day: 07/14 0701 - 07/15 0700 In: 2443.4 [P.O.:400; I.V.:1843.4; IV Piggyback:200] Out: 1750 [Urine:1450; Emesis/NG output:300] Intake/Output this shift: No intake/output data recorded.  Lab Results: Recent Labs    11/16/19 0528 11/17/19 0555 11/18/19 0453  WBC 14.5* 9.8 7.9  HGB 13.5 12.8 13.3  HCT 38.3 37.1 39.5  PLT 199 188 175   BMET Recent Labs    11/16/19 0528 11/17/19 0555 11/18/19 0453  NA 137 136 136  K 3.2* 3.2* 3.5  CL 105 104 103  CO2 19* 21* 21*  GLUCOSE 120* 100* 85  BUN 9 14 19   CREATININE 0.63 0.50 0.58  CALCIUM 8.9 8.9 8.8*   LFT Recent Labs    11/16/19 0528 11/17/19 0555 11/18/19 0453  PROT 7.0   < > 6.9  ALBUMIN 4.3   < > 4.1  AST 28   < > 19  ALT 23   < > 20  ALKPHOS 54   < > 48  BILITOT 0.8   < > 1.2  BILIDIR 0.1  --   --   IBILI 0.7  --   --    < > = values in this interval not displayed.   PT/INR No results for input(s): LABPROT, INR in the last 72 hours.  Studies/Results: No results found.  Impression: Intractable nausea/vomiting, possibly related to CBD (with THC) vape -Normal RUQ [place] 08/2019 -Normal HIDA 05/2019 -EGD 12/25/2018 was normal; no repeat recommended -CMP unremarkable -CBC normal -TSH and cortisol WNL  Plan: Gastric emptying study today. Patient has only had a couple of sips of water but has been NPO otherwise. Per RN, she discussed with NM and they stated it would be OK to have scan today.  She has not had Reglan since 7/12 and has not had any opioids since 7/12.   Avoid Reglan or any narcotics until after gastric emptying scan.  Protonix 40mg  IV qd.  Continue supportive care and anti-emetics as needed.    [facility] will follow.   LOS: 2 days   Salley Slaughter  11/18/2019, 8:40 AM [facility] [phone]

## 2019-11-20 ENCOUNTER — Other Ambulatory Visit: Payer: Self-pay

## 2019-11-20 ENCOUNTER — Emergency Department (HOSPITAL_BASED_OUTPATIENT_CLINIC_OR_DEPARTMENT_OTHER)
Admission: EM | Admit: 2019-11-20 | Discharge: 2019-11-21 | Disposition: A | Discharge status: Home / Self Care | Payer: BC Managed Care – PPO | Attending: Emergency Medicine | Admitting: Emergency Medicine

## 2019-11-20 ENCOUNTER — Encounter (HOSPITAL_BASED_OUTPATIENT_CLINIC_OR_DEPARTMENT_OTHER): Payer: Self-pay

## 2019-11-20 DIAGNOSIS — R112 Nausea with vomiting, unspecified: Secondary | ICD-10-CM | POA: Diagnosis not present

## 2019-11-20 DIAGNOSIS — E876 Hypokalemia: Secondary | ICD-10-CM | POA: Insufficient documentation

## 2019-11-20 DIAGNOSIS — Z87891 Personal history of nicotine dependence: Secondary | ICD-10-CM | POA: Diagnosis not present

## 2019-11-20 DIAGNOSIS — R1110 Vomiting, unspecified: Secondary | ICD-10-CM | POA: Diagnosis present

## 2019-11-20 LAB — COMPREHENSIVE METABOLIC PANEL
ALT: 17 U/L (ref 0–44)
AST: 17 U/L (ref 15–41)
Albumin: 4.3 g/dL (ref 3.5–5.0)
Alkaline Phosphatase: 50 U/L (ref 38–126)
Anion gap: 12 (ref 5–15)
BUN: 16 mg/dL (ref 6–20)
CO2: 26 mmol/L (ref 22–32)
Calcium: 9.1 mg/dL (ref 8.9–10.3)
Chloride: 98 mmol/L (ref 98–111)
Creatinine, Ser: 0.8 mg/dL (ref 0.44–1.00)
GFR calc Af Amer: >60 mL/min (ref >60)
GFR calc non Af Amer: >60 mL/min (ref >60)
Glucose, Bld: 103 mg/dL — ABNORMAL HIGH (ref 70–99)
Potassium: 2.9 mmol/L — ABNORMAL LOW (ref 3.5–5.1)
Sodium: 136 mmol/L (ref 135–145)
Total Bilirubin: 0.8 mg/dL (ref 0.3–1.2)
Total Protein: 7.5 g/dL (ref 6.5–8.1)

## 2019-11-20 LAB — URINALYSIS, ROUTINE W REFLEX MICROSCOPIC
Glucose, UA: NEGATIVE mg/dL
Ketones, ur: 15 mg/dL — AB
Nitrite: NEGATIVE
Protein, ur: 30 mg/dL — AB
Specific Gravity, Urine: 1.02 (ref 1.005–1.030)
pH: 6 (ref 5.0–8.0)

## 2019-11-20 LAB — URINALYSIS, MICROSCOPIC (REFLEX): RBC / HPF: NONE SEEN RBC/hpf (ref 0–5)

## 2019-11-20 LAB — CBC WITH DIFFERENTIAL/PLATELET
Abs Immature Granulocytes: 0.02 10*3/uL (ref 0.00–0.07)
Basophils Absolute: 0 10*3/uL (ref 0.0–0.1)
Basophils Relative: 0 %
Eosinophils Absolute: 0 10*3/uL (ref 0.0–0.5)
Eosinophils Relative: 0 %
HCT: 41.6 % (ref 36.0–46.0)
Hemoglobin: 14.6 g/dL (ref 12.0–15.0)
Immature Granulocytes: 0 %
Lymphocytes Relative: 33 %
Lymphs Abs: 2.9 10*3/uL (ref 0.7–4.0)
MCH: 32.6 pg (ref 26.0–34.0)
MCHC: 35.1 g/dL (ref 30.0–36.0)
MCV: 92.9 fL (ref 80.0–100.0)
Monocytes Absolute: 0.4 10*3/uL (ref 0.1–1.0)
Monocytes Relative: 5 %
Neutro Abs: 5.4 10*3/uL (ref 1.7–7.7)
Neutrophils Relative %: 62 %
Platelets: 260 10*3/uL (ref 150–400)
RBC: 4.48 MIL/uL (ref 3.87–5.11)
RDW: 11.2 % — ABNORMAL LOW (ref 11.5–15.5)
WBC: 8.8 10*3/uL (ref 4.0–10.5)
nRBC: 0 % (ref 0.0–0.2)

## 2019-11-20 LAB — PREGNANCY, URINE: Preg Test, Ur: NEGATIVE

## 2019-11-20 LAB — LIPASE, BLOOD: Lipase: 30 U/L (ref 11–51)

## 2019-11-20 MED ORDER — DROPERIDOL 2.5 MG/ML IJ SOLN
INTRAMUSCULAR | Status: AC
  Filled 2019-11-20: qty 2

## 2019-11-20 MED ORDER — DROPERIDOL 2.5 MG/ML IJ SOLN
2.5000 mg | Freq: Once | INTRAMUSCULAR | Status: AC
  Administered 2019-11-20: 2.5 mg via INTRAVENOUS

## 2019-11-20 MED ORDER — POTASSIUM CHLORIDE 10 MEQ/100ML IV SOLN
10.0000 meq | INTRAVENOUS | Status: AC
  Administered 2019-11-20 (×2): 10 meq via INTRAVENOUS
  Filled 2019-11-20 (×2): qty 100

## 2019-11-20 MED ORDER — POTASSIUM CHLORIDE CRYS ER 20 MEQ PO TBCR
40.0000 meq | EXTENDED_RELEASE_TABLET | Freq: Once | ORAL | Status: AC
  Administered 2019-11-20: 40 meq via ORAL
  Filled 2019-11-20: qty 2

## 2019-11-20 MED ORDER — SODIUM CHLORIDE 0.9 % IV BOLUS
1000.0000 mL | Freq: Once | INTRAVENOUS | Status: AC
  Administered 2019-11-20: 1000 mL via INTRAVENOUS

## 2019-11-20 MED ORDER — ONDANSETRON HCL 4 MG/2ML IJ SOLN
4.0000 mg | Freq: Once | INTRAMUSCULAR | Status: AC
  Administered 2019-11-20: 4 mg via INTRAVENOUS
  Filled 2019-11-20: qty 2

## 2019-11-20 NOTE — ED Notes (Signed)
Date and time results received: 11/20/19 @ 2051hrs (use smartphrase ".now" to insert current time)  Test: K+ level Critical Value: 2.9  Name of Provider Notified: ED PA

## 2019-11-20 NOTE — ED Provider Notes (Signed)
[facility] EMERGENCY DEPARTMENT Provider Note   CSN: [id number] Arrival date & time: 11/20/19  1949     History Chief Complaint  Patient presents with   Emesis    Debbie Hughes is a 29 y.o. female with a past medical history of cannabinoid hyperemesis syndrome who presents to the ED due to 2 days of nausea and vomiting.  Patient admits to numerous episodes of nonbilious emesis.  She admits to intermittent streaks of blood in her emesis.  Patient last used marijuana roughly 3 weeks ago.  She admits to using a CBD vape pen.  Patient was recently admitted to the hospital from 7/12-7/15 due to cannabinoid hyperemesis syndrome.  Patient denies associated abdominal pain, diarrhea, fever, chills, urinary symptoms, vaginal symptoms.  Patient has been taking Phenergan with no relief.  Denies sick contacts and known Covid exposures.  No aggravating or alleviating factors.  History obtained from patient and past medical records. No interpreter used during encounter.      Past Medical History:  Diagnosis Date   Breast mass in female 01/21/06   Breast nodule 04/26/10   Left   BV (bacterial vaginosis) 5.10/11   Candida vaginitis 56/25/63   Cyclical vomiting    Ectopic pregnancy 03/24/2013   Fibroadenoma    H/O varicella    History of chlamydia    Irregular menstrual cycle 07/28/07   LGSIL (low grade squamous intraepithelial lesion) on Pap smear 06/22/07   Vaginal discharge 10/01/07   Vaginitis     Patient Active Problem List   Diagnosis Date Noted   Nausea & vomiting 11/15/2019   Tetrahydrocannabinol (THC) dependence ([place]) 89/37/3428   Cyclic vomiting syndrome 08/08/2019   Intractable nausea and vomiting 11/08/2018   Hypokalemia 11/08/2018   Leukocytosis 11/08/2018   Hyponatremia 11/08/2018   Normal postpartum course 08/27/2018   Indication for care in labor or delivery 08/26/2018   Nausea and vomiting during pregnancy 08/16/2018   Abdominal pain  during pregnancy in third trimester 08/16/2018   Emesis 08/16/2018   Preterm contractions 08/16/2018   Preterm labor 07/25/2017   NSVD (normal spontaneous vaginal delivery) 07/25/2017   Pregnant 12/17/2016   Breast cyst, left (excised in 2007) 11/23/2016   Hyperemesis gravidarum 11/22/2016   Acute generalized abdominal pain 11/22/2016   Allergy to penicillin 09/22/2013   Hx of ectopic pregnancy---s/p left salpingectomy 03/2013 09/22/2013    Past Surgical History:  Procedure Laterality Date   BREAST CYST EXCISION Left 2007   DILATION AND CURETTAGE OF UTERUS     LAPAROSCOPY N/A 03/24/2013   Procedure: LAPAROSCOPY OPERATIVE;  Surgeon: Betsy Coder, MD;  Location: [facility];  Service: Gynecology;  Laterality: N/A;   Left Salpingectomy  03/24/2013   Ectopic Pregnancy   UNILATERAL SALPINGECTOMY Left 03/24/2013   Procedure: UNILATERAL SALPINGECTOMY;  Surgeon: Betsy Coder, MD;  Location: [facility];  Service: Gynecology;  Laterality: Left;     OB History    Gravida  5   Para  3   Term  2   Preterm  1   AB  2   Living  3     SAB  1   TAB      Ectopic  1   Multiple  0   Live Births  3           Family History  Problem Relation Age of Onset   Asthma Maternal Grandmother    Hypertension Maternal Grandmother    Diabetes Maternal Grandmother    Hypertension Paternal  Grandfather    Diabetes Paternal Grandfather     Social History   Tobacco Use   Smoking status: Former Smoker   Smokeless tobacco: Never Used  Scientific laboratory technician Use: Never used  Substance Use Topics   Alcohol use: Yes    Comment: occasional    Drug use: Not on file    Comment: states she has not smoked marijuana in 2 months    Home Medications Prior to Admission medications   Medication Sig Start Date End Date Taking? Authorizing Provider  pantoprazole (PROTONIX) 40 MG tablet Take 1 tablet (40 mg total) by mouth daily. 11/09/18 08/08/19  Antonieta Pert, MD  promethazine  (PHENERGAN) 25 MG suppository Place 1 suppository (25 mg total) rectally every 6 (six) hours as needed for nausea. 11/18/19 11/17/20  Elodia Florence., MD  promethazine (PHENERGAN) 25 MG tablet Take 1 tablet (25 mg total) by mouth every 6 (six) hours as needed for up to 20 doses for nausea or vomiting. 11/18/19   Elodia Florence., MD    Allergies    Penicillins  Review of Systems   Review of Systems  Constitutional: Negative for chills and fever.  Respiratory: Negative for shortness of breath.   Cardiovascular: Negative for chest pain.  Gastrointestinal: Positive for nausea and vomiting. Negative for abdominal pain and diarrhea.  Genitourinary: Negative for dysuria and vaginal discharge.  All other systems reviewed and are negative.   Physical Exam Updated Vital Signs BP 108/89 (BP Location: Right Arm)    Pulse 76    Temp 99.1 F (37.3 C) (Oral)    Resp 20    Ht 5\' 3"  (1.6 m)    Wt 46 kg    SpO2 98%    BMI 17.96 kg/m   Physical Exam Vitals and nursing note reviewed.  Constitutional:      General: She is not in acute distress.    Appearance: She is not ill-appearing.  HENT:     Head: Normocephalic.  Eyes:     Pupils: Pupils are equal, round, and reactive to light.  Cardiovascular:     Rate and Rhythm: Normal rate and regular rhythm.     Pulses: Normal pulses.     Heart sounds: Normal heart sounds. No murmur heard.  No friction rub. No gallop.   Pulmonary:     Effort: Pulmonary effort is normal.     Breath sounds: Normal breath sounds.  Abdominal:     General: Abdomen is flat. Bowel sounds are normal. There is no distension.     Palpations: Abdomen is soft.     Tenderness: There is no abdominal tenderness. There is no guarding or rebound.     Comments: Abdomen soft, nondistended, nontender to palpation in all quadrants without guarding or peritoneal signs. No rebound.   Musculoskeletal:     Cervical back: Neck supple.     Comments: Able to move all 4 extremities  without difficulty.  Skin:    General: Skin is warm and dry.  Neurological:     General: No focal deficit present.     Mental Status: She is alert.  Psychiatric:        Mood and Affect: Mood normal.        Behavior: Behavior normal.     ED Results / Procedures / Treatments   Labs (all labs ordered are listed, but only abnormal results are displayed) Labs Reviewed  CBC WITH DIFFERENTIAL/PLATELET - Abnormal; Notable for the following components:  Result Value   RDW 11.2 (*)    All other components within normal limits  COMPREHENSIVE METABOLIC PANEL - Abnormal; Notable for the following components:   Potassium 2.9 (*)    Glucose, Bld 103 (*)    All other components within normal limits  URINALYSIS, ROUTINE W REFLEX MICROSCOPIC - Abnormal; Notable for the following components:   APPearance CLOUDY (*)    Hgb urine dipstick SMALL (*)    Bilirubin Urine SMALL (*)    Ketones, ur 15 (*)    Protein, ur 30 (*)    Leukocytes,Ua TRACE (*)    All other components within normal limits  URINALYSIS, MICROSCOPIC (REFLEX) - Abnormal; Notable for the following components:   Bacteria, UA RARE (*)    All other components within normal limits  URINE CULTURE  LIPASE, BLOOD  PREGNANCY, URINE  POTASSIUM    EKG EKG Interpretation  Date/Time:  Saturday November 20 2019 20:54:47 EDT Ventricular Rate:  73 PR Interval:    QRS Duration: 96 QT Interval:  306 QTC Calculation: 338 R Axis:   88 Text Interpretation: Sinus rhythm Right atrial enlargement new Borderline T wave abnormalities Confirmed by Blanchie Dessert [phone]) on 11/20/2019 8:57:22 PM   Radiology No results found.  Procedures Procedures (including critical care time)  Medications Ordered in ED Medications  potassium chloride 10 mEq in 100 mL IVPB (10 mEq Intravenous New Bag/Given 11/20/19 2218)  sodium chloride 0.9 % bolus 1,000 mL (1,000 mLs Intravenous New Bag/Given 11/20/19 2058)  potassium chloride SA (KLOR-CON) CR tablet  40 mEq (40 mEq Oral Given 11/20/19 2117)  ondansetron (ZOFRAN) injection 4 mg (4 mg Intravenous Given 11/20/19 2117)  droperidol (INAPSINE) 2.5 MG/ML injection 2.5 mg (2.5 mg Intravenous Given 11/20/19 2133)    ED Course  I have reviewed the triage vital signs and the nursing notes.  Pertinent labs & imaging results that were available during my care of the patient were reviewed by me and considered in my medical decision making (see chart for details).  Clinical Course as of Nov 19 2244  Sat Nov 20, 2019  2056 Potassium(!): 2.9 [CA]  2103 Leukocytes,Ua(!): TRACE [CA]  2124 Bacteria, UA(!): RARE [CA]  2124 Hgb urine dipstick(!): SMALL [CA]  2124 Bilirubin Urine(!): SMALL [CA]  2124 Preg Test, Ur: NEGATIVE [CA]  2127 Patient actively vomiting at bedside. Will give droperidol and reassess patient.    [CA]    Clinical Course User Index [CA] Suzy Bouchard, PA-C   MDM Rules/Calculators/A&P                         29 year old female presents to the ED due to 2 days of nausea and vomiting.  Patient recently admitted to the hospital from 7/12 7/15 due to cannabinoid hyperemesis syndrome.  Patient denies fever, chills, abdominal pain, diarrhea, sick contacts, known Covid exposures.  Upon arrival, vitals all within normal limits.  Patient is afebrile, not tachycardic or hypoxic.  Patient in no acute distress and nontoxic-appearing.  Physical exam reassuring.  Abdomen soft, nondistended, nontender.  Will obtain routine labs to rule out electrolyte abnormalities.  Will also obtain EKG prior to nausea medication.  EKG personally reviewed which demonstrates normal sinus rhythm nonspecific T wave abnormalities. Normal QTC. IV zofran given for symptomatic relief. CBC reassuring with no leukocytosis and normal hemoglobin.  Lipase normal at 30.  Doubt pancreatitis.  CMP significant for hypokalemia at 2.9.  IV and oral potassium given here in the ED.  UA significant for trace leukocytes and rare bacteria.   Small hematuria, ketonuria, and proteinuria likely due to dehydration.  Urine culture pending.  Pregnancy test negative.  Patient handed off to Dr. Florina Ou pending completion of IV potassium and recheck of potassium level. If potassium has improved and patient able to tolerate po at bedside, patient may be discharged home.  Final Clinical Impression(s) / ED Diagnoses Final diagnoses:  Non-intractable vomiting with nausea, unspecified vomiting type  Hypokalemia    Rx / DC Orders ED Discharge Orders    None       Karie Kirks 11/20/19 2247    Blanchie Dessert, MD 11/21/19 1635

## 2019-11-20 NOTE — ED Notes (Signed)
ED Provider at bedside. 

## 2019-11-20 NOTE — ED Notes (Signed)
Ginger ale and peanut butter crackers provided for po challenge.

## 2019-11-20 NOTE — ED Notes (Signed)
Pt informed that urine spec is needed

## 2019-11-20 NOTE — ED Triage Notes (Signed)
Pt admitted early this week to [facility] for intractable vomiting. While in the hospital pt was dx with gastroparesis. Pt was d/c home with a GI follow up. Pt's vomiting returned and pt has vomited 6 times today.

## 2019-11-21 ENCOUNTER — Emergency Department (HOSPITAL_BASED_OUTPATIENT_CLINIC_OR_DEPARTMENT_OTHER)
Admission: EM | Admit: 2019-11-21 | Discharge: 2019-11-21 | Disposition: A | Discharge status: Home / Self Care | Payer: BC Managed Care – PPO | Source: Home / Self Care | Attending: Emergency Medicine | Admitting: Emergency Medicine

## 2019-11-21 ENCOUNTER — Encounter (HOSPITAL_BASED_OUTPATIENT_CLINIC_OR_DEPARTMENT_OTHER): Payer: Self-pay | Admitting: Emergency Medicine

## 2019-11-21 ENCOUNTER — Emergency Department (HOSPITAL_BASED_OUTPATIENT_CLINIC_OR_DEPARTMENT_OTHER): Payer: BC Managed Care – PPO

## 2019-11-21 DIAGNOSIS — Z87891 Personal history of nicotine dependence: Secondary | ICD-10-CM | POA: Insufficient documentation

## 2019-11-21 DIAGNOSIS — R112 Nausea with vomiting, unspecified: Secondary | ICD-10-CM

## 2019-11-21 DIAGNOSIS — R109 Unspecified abdominal pain: Secondary | ICD-10-CM | POA: Insufficient documentation

## 2019-11-21 LAB — MAGNESIUM: Magnesium: 2.2 mg/dL (ref 1.7–2.4)

## 2019-11-21 LAB — RAPID URINE DRUG SCREEN, HOSP PERFORMED
Amphetamines: NOT DETECTED
Barbiturates: NOT DETECTED
Benzodiazepines: NOT DETECTED
Cocaine: NOT DETECTED
Opiates: NOT DETECTED
Tetrahydrocannabinol: POSITIVE — AB

## 2019-11-21 LAB — COMPREHENSIVE METABOLIC PANEL
ALT: 18 U/L (ref 0–44)
AST: 22 U/L (ref 15–41)
Albumin: 4.4 g/dL (ref 3.5–5.0)
Alkaline Phosphatase: 48 U/L (ref 38–126)
Anion gap: 13 (ref 5–15)
BUN: 15 mg/dL (ref 6–20)
CO2: 24 mmol/L (ref 22–32)
Calcium: 9 mg/dL (ref 8.9–10.3)
Chloride: 102 mmol/L (ref 98–111)
Creatinine, Ser: 0.83 mg/dL (ref 0.44–1.00)
GFR calc Af Amer: >60 mL/min (ref >60)
GFR calc non Af Amer: >60 mL/min (ref >60)
Glucose, Bld: 100 mg/dL — ABNORMAL HIGH (ref 70–99)
Potassium: 3.2 mmol/L — ABNORMAL LOW (ref 3.5–5.1)
Sodium: 139 mmol/L (ref 135–145)
Total Bilirubin: 0.7 mg/dL (ref 0.3–1.2)
Total Protein: 7.5 g/dL (ref 6.5–8.1)

## 2019-11-21 LAB — URINALYSIS, ROUTINE W REFLEX MICROSCOPIC
Bilirubin Urine: NEGATIVE
Glucose, UA: NEGATIVE mg/dL
Hgb urine dipstick: NEGATIVE
Ketones, ur: 15 mg/dL — AB
Leukocytes,Ua: NEGATIVE
Nitrite: NEGATIVE
Protein, ur: NEGATIVE mg/dL
Specific Gravity, Urine: 1.01 (ref 1.005–1.030)
pH: 7 (ref 5.0–8.0)

## 2019-11-21 LAB — CBC WITH DIFFERENTIAL/PLATELET
Abs Immature Granulocytes: 0.02 10*3/uL (ref 0.00–0.07)
Basophils Absolute: 0 10*3/uL (ref 0.0–0.1)
Basophils Relative: 0 %
Eosinophils Absolute: 0 10*3/uL (ref 0.0–0.5)
Eosinophils Relative: 0 %
HCT: 42 % (ref 36.0–46.0)
Hemoglobin: 14.8 g/dL (ref 12.0–15.0)
Immature Granulocytes: 0 %
Lymphocytes Relative: 23 %
Lymphs Abs: 1.6 10*3/uL (ref 0.7–4.0)
MCH: 33.2 pg (ref 26.0–34.0)
MCHC: 35.2 g/dL (ref 30.0–36.0)
MCV: 94.2 fL (ref 80.0–100.0)
Monocytes Absolute: 0.2 10*3/uL (ref 0.1–1.0)
Monocytes Relative: 3 %
Neutro Abs: 4.9 10*3/uL (ref 1.7–7.7)
Neutrophils Relative %: 74 %
Platelets: 241 10*3/uL (ref 150–400)
RBC: 4.46 MIL/uL (ref 3.87–5.11)
RDW: 11.3 % — ABNORMAL LOW (ref 11.5–15.5)
WBC: 6.7 10*3/uL (ref 4.0–10.5)
nRBC: 0 % (ref 0.0–0.2)

## 2019-11-21 LAB — LIPASE, BLOOD: Lipase: 37 U/L (ref 11–51)

## 2019-11-21 LAB — PREGNANCY, URINE: Preg Test, Ur: NEGATIVE

## 2019-11-21 LAB — POTASSIUM: Potassium: 3.4 mmol/L — ABNORMAL LOW (ref 3.5–5.1)

## 2019-11-21 MED ORDER — IOHEXOL 300 MG/ML  SOLN
100.0000 mL | Freq: Once | INTRAMUSCULAR | Status: AC | PRN
  Administered 2019-11-21: 80 mL via INTRAVENOUS

## 2019-11-21 MED ORDER — POTASSIUM CHLORIDE CRYS ER 20 MEQ PO TBCR
20.0000 meq | EXTENDED_RELEASE_TABLET | Freq: Two times a day (BID) | ORAL | 0 refills | Status: AC
Start: 2019-11-21 — End: 2019-11-24

## 2019-11-21 MED ORDER — METOCLOPRAMIDE HCL 10 MG PO TABS
10.0000 mg | ORAL_TABLET | Freq: Three times a day (TID) | ORAL | 0 refills | Status: AC | PRN
Start: 2019-11-21 — End: ?

## 2019-11-21 MED ORDER — SODIUM CHLORIDE 0.9 % IV BOLUS
1000.0000 mL | Freq: Once | INTRAVENOUS | Status: AC
  Administered 2019-11-21: 1000 mL via INTRAVENOUS

## 2019-11-21 MED ORDER — HALOPERIDOL LACTATE 5 MG/ML IJ SOLN
5.0000 mg | Freq: Once | INTRAMUSCULAR | Status: AC
  Administered 2019-11-21: 5 mg via INTRAVENOUS
  Filled 2019-11-21: qty 1

## 2019-11-21 NOTE — ED Provider Notes (Signed)
12:33 AM Patient feeling better, able to tolerate oral intake without vomiting.  Potassium now 3.4.  There was slight hemolysis but the lab technician believes this is fairly accurate result.   Jared Cahn, Jenny Reichmann, MD 11/21/19 [phone]

## 2019-11-21 NOTE — ED Notes (Signed)
Patient transported to CT 

## 2019-11-21 NOTE — ED Notes (Signed)
Pt reports some relief from n/v following medication. Aware of need for urine specimen, unable to provide at this time, IV fluids infusing.

## 2019-11-21 NOTE — ED Notes (Signed)
Pt states last dose of phenergan was 0900. No improvement in nausea, continues to be unable to keep down any po food/fluids.

## 2019-11-21 NOTE — ED Triage Notes (Signed)
Pt returns for continuation of Nausea and Vomiting. Pt was seen at [facility] this week and here yesterday.  Pt unable to tolerate PO meds or fluids.

## 2019-11-21 NOTE — ED Provider Notes (Signed)
[facility] EMERGENCY DEPARTMENT Provider Note   CSN: [id number] Arrival date & time: 11/21/19  1128     History Chief Complaint  Patient presents with  . Nausea  . Emesis    Debbie Hughes is a 29 y.o. female.  The history is provided by the patient.  Emesis Severity:  Moderate Timing:  Constant Progression:  Worsening Chronicity:  Recurrent Recent urination:  Normal Relieved by:  Nothing Worsened by:  Nothing Ineffective treatments: phenergen. Associated symptoms: abdominal pain   Associated symptoms: no arthralgias, no chills, no cough, no diarrhea, no fever and no sore throat   Risk factors comment:  Diagnosed with gastroparesis       Past Medical History:  Diagnosis Date  . Breast mass in female 01/21/06  . Breast nodule 04/26/10   Left  . BV (bacterial vaginosis) 5.10/11  . Candida vaginitis 04/26/10  . Cyclical vomiting   . Ectopic pregnancy 03/24/2013  . Fibroadenoma   . H/O varicella   . History of chlamydia   . Irregular menstrual cycle 07/28/07  . 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 Vaginal discharge 10/01/07  . Vaginitis     Patient Active Problem List   Diagnosis Date Noted  . Nausea & vomiting 11/15/2019  . Tetrahydrocannabinol (THC) dependence ([place]) 08/11/2019  . Cyclic vomiting syndrome 08/08/2019  . Intractable nausea and vomiting 11/08/2018  . Hypokalemia 11/08/2018  . Leukocytosis 11/08/2018  . Hyponatremia 11/08/2018  . Normal postpartum course 08/27/2018  . Indication for care in labor or delivery 08/26/2018  . Nausea and vomiting during pregnancy 08/16/2018  . Abdominal pain during pregnancy in third trimester 08/16/2018  . Emesis 08/16/2018  . Preterm contractions 08/16/2018  . Preterm labor 07/25/2017  . NSVD (normal spontaneous vaginal delivery) 07/25/2017  . Pregnant 12/17/2016  . Breast cyst, left (excised in 2007) 11/23/2016  . Hyperemesis gravidarum 11/22/2016  . Acute  generalized abdominal pain 11/22/2016  . Allergy to penicillin 09/22/2013  . Hx of ectopic pregnancy---s/p left salpingectomy 03/2013 09/22/2013    Past Surgical History:  Procedure Laterality Date  . BREAST CYST EXCISION Left 2007  . DILATION AND CURETTAGE OF UTERUS    . LAPAROSCOPY N/A 03/24/2013   Procedure: LAPAROSCOPY OPERATIVE;  Surgeon: Betsy Coder, MD;  Location: [facility];  Service: Gynecology;  Laterality: N/A;  . Left Salpingectomy  03/24/2013   Ectopic Pregnancy  . UNILATERAL SALPINGECTOMY Left 03/24/2013   Procedure: UNILATERAL SALPINGECTOMY;  Surgeon: Betsy Coder, MD;  Location: [facility];  Service: Gynecology;  Laterality: Left;     OB History    Gravida  5   Para  3   Term  2   Preterm  1   AB  2   Living  3     SAB  1   TAB      Ectopic  1   Multiple  0   Live Births  3           Family History  Problem Relation Age of Onset  . Asthma Maternal Grandmother   . Hypertension Maternal Grandmother   . Diabetes Maternal Grandmother   . Hypertension Paternal Grandfather   . Diabetes Paternal Grandfather     Social History   Tobacco Use  . Smoking status: Former Research scientist (life sciences)  . Smokeless tobacco: Never Used  Vaping Use  . Vaping Use: Never used  Substance Use Topics  . Alcohol use: Yes    Comment: occasional   .  Drug use: Not on file    Comment: states she has not smoked marijuana in 2 months    Home Medications Prior to Admission medications   Medication Sig Start Date End Date Taking? Authorizing Provider  metoCLOPramide (REGLAN) 10 MG tablet Take 1 tablet (10 mg total) by mouth every 8 (eight) hours as needed for up to 30 doses for nausea or vomiting. 11/21/19   Laylamarie Meuser, DO  pantoprazole (PROTONIX) 40 MG tablet Take 1 tablet (40 mg total) by mouth daily. 11/09/18 08/08/19  Antonieta Pert, MD  potassium chloride SA (KLOR-CON) 20 MEQ tablet Take 1 tablet (20 mEq total) by mouth 2 (two) times daily for 3 days. 11/21/19 11/24/19  Molpus, Jenny Reichmann,  MD  promethazine (PHENERGAN) 25 MG suppository Place 1 suppository (25 mg total) rectally every 6 (six) hours as needed for nausea. 11/18/19 11/17/20  Elodia Florence., MD    Allergies    Penicillins  Review of Systems   Review of Systems  Constitutional: Negative for chills and fever.  HENT: Negative for ear pain and sore throat.   Eyes: Negative for pain and visual disturbance.  Respiratory: Negative for cough and shortness of breath.   Cardiovascular: Negative for chest pain and palpitations.  Gastrointestinal: Positive for abdominal pain and vomiting. Negative for diarrhea.  Genitourinary: Negative for dysuria and hematuria.  Musculoskeletal: Negative for arthralgias and back pain.  Skin: Negative for color change and rash.  Neurological: Negative for seizures and syncope.  All other systems reviewed and are negative.   Physical Exam Updated Vital Signs  ED Triage Vitals  Enc Vitals Group     BP 11/21/19 1134 123/74     Pulse Rate 11/21/19 1134 73     Resp 11/21/19 1134 16     Temp 11/21/19 1134 99.9 F (37.7 C)     Temp Source 11/21/19 1134 Oral     SpO2 11/21/19 1134 100 %     Weight 11/21/19 1135 101 lb 6.6 oz (46 kg)     Height 11/21/19 1135 5\' 3"  (1.6 m)     Head Circumference --      Peak Flow --      Pain Score 11/21/19 1135 8     Pain Loc --      Pain Edu? --      Excl. in [place]? --     Physical Exam Vitals and nursing note reviewed.  Constitutional:      General: She is not in acute distress.    Appearance: She is well-developed.  HENT:     Head: Normocephalic and atraumatic.     Nose: Nose normal.     Mouth/Throat:     Mouth: Mucous membranes are moist.  Eyes:     Extraocular Movements: Extraocular movements intact.     Conjunctiva/sclera: Conjunctivae normal.     Pupils: Pupils are equal, round, and reactive to light.  Cardiovascular:     Rate and Rhythm: Normal rate and regular rhythm.     Heart sounds: No murmur heard.   Pulmonary:      Effort: Pulmonary effort is normal. No respiratory distress.     Breath sounds: Normal breath sounds.  Abdominal:     General: There is no distension.     Palpations: Abdomen is soft. There is no mass.     Tenderness: There is abdominal tenderness. There is no guarding.     Hernia: No hernia is present.  Musculoskeletal:     Cervical back: Neck  supple.  Skin:    General: Skin is warm and dry.     Capillary Refill: Capillary refill takes less than 2 seconds.  Neurological:     General: No focal deficit present.     Mental Status: She is alert.  Psychiatric:        Mood and Affect: Mood normal.     ED Results / Procedures / Treatments   Labs (all labs ordered are listed, but only abnormal results are displayed) Labs Reviewed  CBC WITH DIFFERENTIAL/PLATELET - Abnormal; Notable for the following components:      Result Value   RDW 11.3 (*)    All other components within normal limits  COMPREHENSIVE METABOLIC PANEL - Abnormal; Notable for the following components:   Potassium 3.2 (*)    Glucose, Bld 100 (*)    All other components within normal limits  URINALYSIS, ROUTINE W REFLEX MICROSCOPIC - Abnormal; Notable for the following components:   Ketones, ur 15 (*)    All other components within normal limits  RAPID URINE DRUG SCREEN, HOSP PERFORMED - Abnormal; Notable for the following components:   Tetrahydrocannabinol POSITIVE (*)    All other components within normal limits  LIPASE, BLOOD  PREGNANCY, URINE  MAGNESIUM    EKG EKG Interpretation  Date/Time:  Sunday November 21 2019 12:24:14 EDT Ventricular Rate:  74 PR Interval:    QRS Duration: 100 QT Interval:  403 QTC Calculation: 448 R Axis:   83 Text Interpretation: Sinus rhythm Borderline ST elevation, anterior leads Confirmed by Lennice Sites [phone]) on 11/21/2019 12:27:39 PM   Radiology CT ABDOMEN PELVIS W CONTRAST  Result Date: 11/21/2019 CLINICAL DATA:  Nausea, vomiting. EXAM: CT ABDOMEN AND PELVIS WITH  CONTRAST TECHNIQUE: Multidetector CT imaging of the abdomen and pelvis was performed using the standard protocol following bolus administration of intravenous contrast. CONTRAST:  90mL OMNIPAQUE IOHEXOL 300 MG/ML  SOLN COMPARISON:  February 23, 2019. FINDINGS: Lower chest: No acute abnormality. Hepatobiliary: No focal liver abnormality is seen. No gallstones, gallbladder wall thickening, or biliary dilatation. Pancreas: Unremarkable. No pancreatic ductal dilatation or surrounding inflammatory changes. Spleen: Normal in size without focal abnormality. Adrenals/Urinary Tract: Adrenal glands appear normal. Nonobstructive left renal calculus is noted. No hydronephrosis or renal obstruction is noted. The urinary bladder is unremarkable. Stomach/Bowel: Stomach appears normal. There is no evidence of bowel obstruction or inflammation. The appendix is not clearly visualized, but no inflammation is noted in the right lower quadrant. Vascular/Lymphatic: No significant vascular findings are present. No enlarged abdominal or pelvic lymph nodes. Reproductive: Intrauterine device is noted. No adnexal abnormality is noted. Other: No abdominal wall hernia or abnormality. No abdominopelvic ascites. Musculoskeletal: No acute or significant osseous findings. IMPRESSION: 1. Nonobstructive left renal calculus. No hydronephrosis or renal obstruction is noted. 2. No other abnormality seen in the abdomen or pelvis. Electronically Signed   By: Marijo Conception M.D.   On: 11/21/2019 13:33    Procedures Procedures (including critical care time)  Medications Ordered in ED Medications  haloperidol lactate (HALDOL) injection 5 mg (5 mg Intravenous Given 11/21/19 1217)  sodium chloride 0.9 % bolus 1,000 mL ( Intravenous Stopped 11/21/19 1349)  iohexol (OMNIPAQUE) 300 MG/ML solution 100 mL (80 mLs Intravenous Contrast Given 11/21/19 1234)    ED Course  I have reviewed the triage vital signs and the nursing notes.  Pertinent labs &  imaging results that were available during my care of the patient were reviewed by me and considered in my medical decision making (  see chart for details).    MDM Rules/Calculators/A&P                          Debbie Hughes is a 29 year old female with history of gastroparesis who presents to the ED with ongoing nausea and vomiting.  Recent admission to the hospital for the same.  Patient was seen here yesterday with some improvement.  Went home but despite Phenergan still having difficulty keeping down fluids.  Has some abdominal pain.  Has not had any CT imaging her abdomen and pelvis.  Did have a gastric emptying study that was unremarkable.  Will get lab work including CT scan abdomen and pelvis to evaluate for other causes of her symptoms.  However suspect ongoing cyclical vomiting syndrome.  This is thought to be from marijuana however patient states that she only takes CBD feels.  Will give IV fluids, IV Haldol.  May need to consider readmission given her symptoms.  Patient with no significant anemia, electrolyte abnormality, kidney injury.  CT scan reveals no significant findings.  Likely ongoing gastroparesis.  Felt better after IV Haldol.  Will switch Phenergan to Reglan.  Recommend still using Phenergan suppository if needed.  Given return precautions.  Encouraged to avoid any marijuana or CBD use.    This chart was dictated using voice recognition software.  Despite best efforts to proofread,  errors can occur which can change the documentation meaning.  Final Clinical Impression(s) / ED Diagnoses Final diagnoses:  Non-intractable vomiting with nausea, unspecified vomiting type    Rx / DC Orders ED Discharge Orders         Ordered    metoCLOPramide (REGLAN) 10 MG tablet  Every 8 hours PRN     Discontinue  Reprint     11/21/19 1421           Severus Brodzinski, [place], DO 11/21/19 1426

## 2019-11-21 NOTE — ED Notes (Signed)
ED Provider at bedside. 

## 2019-11-21 NOTE — Discharge Instructions (Addendum)
Please continue to avoid any marijuana or CBD.  Follow-up with GI.  Use Phenergan suppositories if cannot tolerate p.o.  Please try using Reglan instead of oral Phenergan at this time.

## 2019-11-21 NOTE — ED Notes (Signed)
Pt. Made aware that a urine specimen needed, again.

## 2019-11-22 LAB — URINE CULTURE: Culture: 10000 — AB

## 2020-05-10 ENCOUNTER — Other Ambulatory Visit: Payer: BC Managed Care – PPO

## 2021-09-27 ENCOUNTER — Emergency Department (HOSPITAL_BASED_OUTPATIENT_CLINIC_OR_DEPARTMENT_OTHER)
Admission: EM | Admit: 2021-09-27 | Discharge: 2021-09-27 | Disposition: A | Discharge status: Home / Self Care | Payer: BC Managed Care – PPO | Attending: Emergency Medicine | Admitting: Emergency Medicine

## 2021-09-27 ENCOUNTER — Emergency Department (HOSPITAL_BASED_OUTPATIENT_CLINIC_OR_DEPARTMENT_OTHER): Payer: BC Managed Care – PPO | Admitting: Radiology

## 2021-09-27 ENCOUNTER — Ambulatory Visit
Admission: RE | Admit: 2021-09-27 | Discharge: 2021-09-27 | Disposition: A | Discharge status: Home / Self Care | Payer: BC Managed Care – PPO | Source: Ambulatory Visit | Attending: Emergency Medicine | Admitting: Emergency Medicine

## 2021-09-27 ENCOUNTER — Encounter (HOSPITAL_BASED_OUTPATIENT_CLINIC_OR_DEPARTMENT_OTHER): Payer: Self-pay | Admitting: Emergency Medicine

## 2021-09-27 ENCOUNTER — Other Ambulatory Visit: Payer: Self-pay

## 2021-09-27 VITALS — BP 110/70 | HR 82 | Temp 98.7°F | Resp 18

## 2021-09-27 DIAGNOSIS — M79672 Pain in left foot: Secondary | ICD-10-CM | POA: Diagnosis present

## 2021-09-27 MED ORDER — METHYLPREDNISOLONE 4 MG PO TBPK: ORAL_TABLET | ORAL | 0 refills | Status: DC

## 2021-09-27 NOTE — ED Notes (Signed)
Reviewed AVS/discharge instruction with patient. Time allotted for and all questions answered. Patient is agreeable for d/c and escorted to ed exit by staff.  

## 2021-09-27 NOTE — ED Triage Notes (Signed)
Pt arrives pov, slow gait on crutches, with c/o left heel pain after stepping on a toy 2 weeks ago. Throbbing pain at rest, sharp with ambulation.Difficulty bearing wt. UC referred to ED for xray

## 2021-09-27 NOTE — ED Provider Notes (Signed)
[facility]    CSN: [id number] Arrival date & time: 09/27/21  1331    HISTORY   Chief Complaint  Patient presents with   Foot Pain    Increasing heel pain over the past two weeks. Now hurts to walk or apply pressure on left heel. - Entered by patient   HPI Debbie Hughes is a 31 y.o. female. Patient presents to urgent care today.  Patient states 2 weeks ago she stepped on one of her son's action figure toys, states it was sharp and was painful.  Patient states initially did not hurt that much but now she has had progressively worsening pain over the past 2 weeks.  Today, patient states the heel is very red, painful and little bit swollen.  Patient is wearing well cushioned sneakers today which she states are more comfortable than some of her other shoes.  Patient denies a history of gout or family history of gout.  Patient endorses a history of rheumatoid arthritis in an aunt and uncle, states she herself does not have any other joint pain, just pain in her heel at this time.  Patient denies red streaking up the heel, states that she did not lacerate her heel when she stepped on the action figure toy.  The history is provided by the patient.  Past Medical History:  Diagnosis Date   Breast mass in female 01/21/06   Breast nodule 04/26/10   Left   BV (bacterial vaginosis) 5.10/11   Candida vaginitis 41/32/44   Cyclical vomiting    Ectopic pregnancy 03/24/2013   Fibroadenoma    H/O varicella    History of chlamydia    Irregular menstrual cycle 07/28/07   LGSIL (low grade squamous intraepithelial lesion) on Pap smear 06/22/07   Vaginal discharge 10/01/07   Vaginitis    Patient Active Problem List   Diagnosis Date Noted   Nausea & vomiting 11/15/2019   Tetrahydrocannabinol (THC) dependence ([place]) 05/08/7251   Cyclic vomiting syndrome 08/08/2019   Intractable nausea and vomiting 11/08/2018   Hypokalemia 11/08/2018   Leukocytosis 11/08/2018   Hyponatremia 11/08/2018    Normal postpartum course 08/27/2018   Indication for care in labor or delivery 08/26/2018   Nausea and vomiting during pregnancy 08/16/2018   Abdominal pain during pregnancy in third trimester 08/16/2018   Emesis 08/16/2018   Preterm contractions 08/16/2018   Preterm labor 07/25/2017   NSVD (normal spontaneous vaginal delivery) 07/25/2017   Pregnant 12/17/2016   Breast cyst, left (excised in 2007) 11/23/2016   Hyperemesis gravidarum 11/22/2016   Acute generalized abdominal pain 11/22/2016   Allergy to penicillin 09/22/2013   Hx of ectopic pregnancy---s/p left salpingectomy 03/2013 09/22/2013   Past Surgical History:  Procedure Laterality Date   BREAST CYST EXCISION Left 2007   DILATION AND CURETTAGE OF UTERUS     LAPAROSCOPY N/A 03/24/2013   Procedure: LAPAROSCOPY OPERATIVE;  Surgeon: Betsy Coder, MD;  Location: [facility];  Service: Gynecology;  Laterality: N/A;   Left Salpingectomy  03/24/2013   Ectopic Pregnancy   UNILATERAL SALPINGECTOMY Left 03/24/2013   Procedure: UNILATERAL SALPINGECTOMY;  Surgeon: Betsy Coder, MD;  Location: [facility];  Service: Gynecology;  Laterality: Left;   OB History     Gravida  5   Para  3   Term  2   Preterm  1   AB  2   Living  3      SAB  1   IAB      Ectopic  1   Multiple  0   Live Births  3          Home Medications    Prior to Admission medications   Medication Sig Start Date End Date Taking? Authorizing Provider  metoCLOPramide (REGLAN) 10 MG tablet Take 1 tablet (10 mg total) by mouth every 8 (eight) hours as needed for up to 30 doses for nausea or vomiting. 11/21/19   Curatolo, Adam, DO  pantoprazole (PROTONIX) 40 MG tablet Take 1 tablet (40 mg total) by mouth daily. 11/09/18 08/08/19  Antonieta Pert, MD  potassium chloride SA (KLOR-CON) 20 MEQ tablet Take 1 tablet (20 mEq total) by mouth 2 (two) times daily for 3 days. 11/21/19 11/24/19  Molpus, Jenny Reichmann, MD  promethazine (PHENERGAN) 25 MG suppository Place 1 suppository (25  mg total) rectally every 6 (six) hours as needed for nausea. 11/18/19 11/17/20  Elodia Florence., MD    Family History Family History  Problem Relation Age of Onset   Asthma Maternal Grandmother    Hypertension Maternal Grandmother    Diabetes Maternal Grandmother    Hypertension Paternal Grandfather    Diabetes Paternal Grandfather    Social History Social History   Tobacco Use   Smoking status: Former   Smokeless tobacco: Never  Scientific laboratory technician Use: Never used  Substance Use Topics   Alcohol use: Yes    Comment: occasional    Allergies   Penicillins  Review of Systems Review of Systems Pertinent findings noted in history of present illness.   Physical Exam Triage Vital Signs ED Triage Vitals  Enc Vitals Group     BP 03/02/21 0827 (!) 147/82     Pulse Rate 03/02/21 0827 72     Resp 03/02/21 0827 18     Temp 03/02/21 0827 98.3 F (36.8 C)     Temp Source 03/02/21 0827 Oral     SpO2 03/02/21 0827 98 %     Weight --      Height --      Head Circumference --      Peak Flow --      Pain Score 03/02/21 0826 5     Pain Loc --      Pain Edu? --      Excl. in [place]? --   No data found.  Updated Vital Signs BP 110/70   Pulse 82   Temp 98.7 F (37.1 C)   Resp 18   SpO2 98%   Physical Exam Vitals and nursing note reviewed.  Constitutional:      General: She is not in acute distress.    Appearance: Normal appearance. She is not ill-appearing.  HENT:     Head: Normocephalic and atraumatic.  Eyes:     General: Lids are normal.        Right eye: No discharge.        Left eye: No discharge.     Extraocular Movements: Extraocular movements intact.     Conjunctiva/sclera: Conjunctivae normal.     Right eye: Right conjunctiva is not injected.     Left eye: Left conjunctiva is not injected.  Neck:     Trachea: Trachea and phonation normal.  Cardiovascular:     Rate and Rhythm: Normal rate and regular rhythm.     Pulses: Normal pulses.     Heart sounds:  Normal heart sounds. No murmur heard.   No friction rub. No gallop.  Pulmonary:     Effort: Pulmonary effort  is normal. No accessory muscle usage, prolonged expiration or respiratory distress.     Breath sounds: Normal breath sounds. No stridor, decreased air movement or transmitted upper airway sounds. No decreased breath sounds, wheezing, rhonchi or rales.  Chest:     Chest wall: No tenderness.  Musculoskeletal:        General: Swelling and tenderness present. Normal range of motion.     Cervical back: Normal range of motion and neck supple. Normal range of motion.     Comments: Erythema, warmth and tenderness to palpation of sole of left foot at the heel.  Patient ambulatory on arrival but reports pain with weightbearing  Lymphadenopathy:     Cervical: No cervical adenopathy.  Skin:    General: Skin is warm and dry.     Findings: No erythema or rash.  Neurological:     General: No focal deficit present.     Mental Status: She is alert and oriented to person, place, and time.  Psychiatric:        Mood and Affect: Mood normal.        Behavior: Behavior normal.    Visual Acuity Right Eye Distance:   Left Eye Distance:   Bilateral Distance:    Right Eye Near:   Left Eye Near:    Bilateral Near:     UC Couse / Diagnostics / Procedures:    EKG  Radiology No results found.  Procedures Procedures (including critical care time)  UC Diagnoses / Final Clinical Impressions(s)   I have reviewed the triage vital signs and the nursing notes.  Pertinent labs & imaging results that were available during my care of the patient were reviewed by me and considered in my medical decision making (see chart for details).    Final diagnoses:  Inflammatory heel pain, left   Patient will be evaluated for gout by checking a uric acid level.  Patient provided with a tapering dose of methylprednisolone to help relieve her inflammation.  Patient provided with crutches so that she can remain  nonweightbearing on the left heel until we figure out what is going on.  Patient sent to the med center [place] location to have x-ray done of her left foot because we do not have radiology tech here in the office today.  Patient advised we will contact her with the results with further recommendations.  Patient also advised to consider reaching out to her primary care provider to discuss referral to podiatry, particularly if there are abnormal findings on the x-ray.  If her uric acid level is elevated, patient will have already been initiated on steroid treatment but will require a longer tapering dose which should be provided for her should the uric acid level be elevated.  Recommend methylprednisolone 24 mg for 3 days (regardless of how much of the tapering Dosepak she has already completed), 20 mg for 3 days, 16 mg for 3 days, 12 mg for 3 days, 8 mg for 3 days, 4 mg for 3 days, patient should plan to follow-up with her primary care provider in 3 to 4 weeks regarding suppressive therapy with allopurinol if uric acid level is elevated today, patient advised.  Return precautions advised.  ED Prescriptions     Medication Sig Dispense Auth. Provider   methylPREDNISolone (MEDROL DOSEPAK) 4 MG TBPK tablet Take 24 mg on day 1, 20 mg on day 2, 16 mg on day 3, 12 mg on day 4, 8 mg on day 5, 4 mg on day 6.  21 tablet Lynden Oxford Scales, PA-C      PDMP not reviewed this encounter.  Pending results:  Labs Reviewed  URIC ACID    Medications Ordered in UC: Medications - No data to display  Disposition Upon Discharge:  Condition: stable for discharge home Home: take medications as prescribed; routine discharge instructions as discussed; follow up as advised.  Patient presented with an acute illness with associated systemic symptoms and significant discomfort requiring urgent management. In my opinion, this is a condition that a prudent lay person (someone who possesses an average knowledge of  health and medicine) may potentially expect to result in complications if not addressed urgently such as respiratory distress, impairment of bodily function or dysfunction of bodily organs.   Routine symptom specific, illness specific and/or disease specific instructions were discussed with the patient and/or caregiver at length.   As such, the patient has been evaluated and assessed, work-up was performed and treatment was provided in alignment with urgent care protocols and evidence based medicine.  Patient/parent/caregiver has been advised that the patient may require follow up for further testing and treatment if the symptoms continue in spite of treatment, as clinically indicated and appropriate.  Patient/parent/caregiver has been advised to return to the [facility] or PCP if no better; to PCP or the Emergency Department if new signs and symptoms develop, or if the current signs or symptoms continue to change or worsen for further workup, evaluation and treatment as clinically indicated and appropriate  The patient will follow up with their current PCP if and as advised. If the patient does not currently have a PCP we will assist them in obtaining one.   The patient may need specialty follow up if the symptoms continue, in spite of conservative treatment and management, for further workup, evaluation, consultation and treatment as clinically indicated and appropriate.   Patient/parent/caregiver verbalized understanding and agreement of plan as discussed.  All questions were addressed during visit.  Please see discharge instructions below for further details of plan.  Discharge Instructions:   Discharge Instructions      The result of your blood work today which was a uric acid level should be available in the next 2 to 3 days.  Initially the result will be posted to your MyChart account if you like to look them up.  We will contact you by phone with this result if it is abnormal with further  recommendations if any.    In the meantime, I have provided you with a prescription for methylprednisolone, a tapering dose of steroid that should give you some significant anti-inflammatory pain relief.  I believe this will be much more effective than ibuprofen 800 mg.  Please begin taking these first thing tomorrow morning with your breakfast meal, please take 1 full row of tablets all at once daily until all rows are complete.    Please feel free to place an ice pack on your heel multiple times throughout the day for 20 minutes each time, this will also significantly reduce inflammation in your heel.  We have provided you with crutches so that you can get around without walking on it.  I enclosed information about heel spurs for you to read just in case this is what is seen on x-ray.  I also recommend that you reach out to your primary care provider to discuss referral to podiatry.  You may benefit from a custom insole which is a Herbalist.  Thank you for visiting urgent care today.  This office note has been dictated using Museum/gallery curator.  Unfortunately, and despite my best efforts, this method of dictation can sometimes lead to occasional typographical or grammatical errors.  I apologize in advance if this occurs.     Lynden Oxford Scales, PA-C 09/27/21 1415

## 2021-09-27 NOTE — Discharge Instructions (Signed)
The result of your blood work today which was a uric acid level should be available in the next 2 to 3 days.  Initially the result will be posted to your MyChart account if you like to look them up.  We will contact you by phone with this result if it is abnormal with further recommendations if any.    In the meantime, I have provided you with a prescription for methylprednisolone, a tapering dose of steroid that should give you some significant anti-inflammatory pain relief.  I believe this will be much more effective than ibuprofen 800 mg.  Please begin taking these first thing tomorrow morning with your breakfast meal, please take 1 full row of tablets all at once daily until all rows are complete.    Please feel free to place an ice pack on your heel multiple times throughout the day for 20 minutes each time, this will also significantly reduce inflammation in your heel.  We have provided you with crutches so that you can get around without walking on it.  I enclosed information about heel spurs for you to read just in case this is what is seen on x-ray.  I also recommend that you reach out to your primary care provider to discuss referral to podiatry.  You may benefit from a custom insole which is a Herbalist.  Thank you for visiting urgent care today.

## 2021-09-27 NOTE — ED Triage Notes (Signed)
Pt here with left heel pain after stepping on a toy 2 weeks ago. At rest it is a throbbing pain and while walking it is sharp. Slight swelling.

## 2021-09-27 NOTE — ED Notes (Signed)
Ice applied to left foot. Foot elevated per patient comfort.

## 2021-09-27 NOTE — Discharge Instructions (Addendum)
Please use Tylenol or ibuprofen for pain.  You may use 600 mg ibuprofen every 6 hours or 1000 mg of Tylenol every 6 hours.  You may choose to alternate between the 2.  This would be most effective.  Not to exceed 4 g of Tylenol within 24 hours.  Not to exceed 3200 mg ibuprofen 24 hours.  I recommend some heel inserts, supportive shoes, rest of the affected foot as you are able. If pain persists I recommend follow up with podiatry / orthopedics.

## 2021-09-27 NOTE — ED Provider Notes (Signed)
[facility] EMER[facility]ENC[facility] DEPT Provider Note   CSN: [id number] Arrival date & time: 09/27/21  1432     History  Chief Complaint  Patient presents with   Foot Injury    Debbie Hughes is a 31 y.o. female with no significant past medical history presents with some chronic pain of the left heel after stepping on a sharp toy 2 weeks ago presents from urgent care for x-ray.  Urgent care was suspicious of gout versus arthritic changes and provided patient with Medrol Dosepak, and discussed further evaluation for gout with PCP if uric acid level is elevated.  Patient reports that she has pain of the left heel which is gradual in onset, worse with walking.  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  She reports that she does not wear orthotics or insoles.  She denies fever, chill, numbness, tingling.  She denies the pain radiating to elsewhere in the body.  She reports some improvement of pain with Tylenol.   Foot Injury     Home Medications Prior to Admission medications   Medication Sig Start Date End Date Taking? Authorizing Provider  methylPREDNISolone (MEDROL DOSEPAK) 4 MG TBPK tablet Take 24 mg on day 1, 20 mg on day 2, 16 mg on day 3, 12 mg on day 4, 8 mg on day 5, 4 mg on day 6. 09/27/21   Lynden Oxford Scales, PA-C  metoCLOPramide (REGLAN) 10 MG tablet Take 1 tablet (10 mg total) by mouth every 8 (eight) hours as needed for up to 30 doses for nausea or vomiting. 11/21/19   Curatolo, Adam, DO  pantoprazole (PROTONIX) 40 MG tablet Take 1 tablet (40 mg total) by mouth daily. 11/09/18 08/08/19  Antonieta Pert, MD  potassium chloride SA (KLOR-CON) 20 MEQ tablet Take 1 tablet (20 mEq total) by mouth 2 (two) times daily for 3 days. 11/21/19 11/24/19  Molpus, Jenny Reichmann, MD  promethazine (PHENERGAN) 25 MG suppository Place 1 suppository (25 mg total) rectally every 6 (six) hours as needed for nausea. 11/18/19 11/17/20  Elodia Florence., MD      Allergies    Penicillins    Review of Systems   Review of Systems  Musculoskeletal:  Positive for arthralgias.  All other systems reviewed and are negative.  Physical Exam Updated Vital Signs BP 112/71   Pulse 72   Temp 98.6 F (37 C) (Oral)   Resp 16   Ht '5\' 3"'$  (1.6 m)   Wt 49.9 kg   SpO2 100%   BMI 19.49 kg/m  Physical Exam Vitals and nursing note reviewed.  Constitutional:      General: She is not in acute distress.    Appearance: Normal appearance.  HENT:     Head: Normocephalic and atraumatic.  Eyes:     General:        Right eye: No discharge.        Left eye: No discharge.  Cardiovascular:     Rate and Rhythm: Normal rate and regular rhythm.  Pulmonary:     Effort: Pulmonary effort is normal. No respiratory distress.  Musculoskeletal:        General: No deformity.     Comments: Some tenderness to palpation of the left heel without significant redness, inflammation.  No evidence of tracking redness, cellulitis.  Intact range of motion to plantarflexion, dorsiflexion of the left heel.  No significant tenderness of  the Achilles tendon or tightness.  Skin:    General: Skin is warm and dry.  Neurological:     Mental Status: She is alert and oriented to person, place, and time.  Psychiatric:        Mood and Affect: Mood normal.        Behavior: Behavior normal.    ED Results / Procedures / Treatments   Labs (all labs ordered are listed, but only abnormal results are displayed) Labs Reviewed - No data to display  EKG None  Radiology DG Foot Complete Left  Result Date: 09/27/2021 CLINICAL DATA:  Pain, possible trauma 2 weeks earlier EXAM: LEFT FOOT - COMPLETE 3+ VIEW COMPARISON:  None Available. FINDINGS: There is no evidence of fracture or dislocation. There is no evidence of arthropathy or other focal bone abnormality. Soft tissues are unremarkable. IMPRESSION: No radiographic abnormality is seen in the left foot. Electronically  Signed   By: Elmer Picker M.D.   On: 09/27/2021 15:11    Procedures Procedures    Medications Ordered in ED Medications - No data to display  ED Course/ Medical Decision Making/ A&P                           Medical Decision Making Amount and/or Complexity of Data Reviewed Radiology: ordered.   Is an overall well-appearing 31 year old female who presents with some pain of the left heel after injury 2 weeks ago.  [place] differential diagnosis includes acute fracture, dislocation, gout, versus soft tissue injury, plantar fasciitis, Achilles tendinitis, or other abnormality.  Patient with neurovascularly intact left lower extremity, I do not note significant redness, inflammation on my evaluation.  Discussed with patient is possible that urgent care diagnosis was reasonable and there could be some early gout changes, she can continue these treatments that were prescribed and follow-up with her PCP.  However clinically have larger suspicion for soft tissue injury/contusion of the heel pad on the left which is inflamed, and having difficulty healing secondary to patient's job and frequency of time on feet. I independently interpreted imaging including plain film radiograph of the left foot which shows no evidence of acute fracture, dislocation, or other bony abnormality. I agree with the radiologist interpretation.  Encouraged ibuprofen, Tylenol, orthotics, and follow-up with orthopedics/podiatry.  Discussed extensive return precautions.  Patient discharged in stable condition at this time. Final Clinical Impression(s) / ED Diagnoses Final diagnoses:  Pain of left heel    Rx / DC Orders ED Discharge Orders     None         Dorien Chihuahua 09/27/21 [place], MD 09/28/21 [phone]

## 2021-09-28 LAB — URIC ACID: Uric Acid: 3.2 mg/dL (ref 2.6–6.2)

## 2022-06-29 ENCOUNTER — Other Ambulatory Visit: Payer: Self-pay

## 2022-06-29 ENCOUNTER — Encounter (HOSPITAL_BASED_OUTPATIENT_CLINIC_OR_DEPARTMENT_OTHER): Payer: Self-pay | Admitting: Emergency Medicine

## 2022-06-29 ENCOUNTER — Emergency Department (HOSPITAL_BASED_OUTPATIENT_CLINIC_OR_DEPARTMENT_OTHER)
Admission: EM | Admit: 2022-06-29 | Discharge: 2022-06-29 | Disposition: A | Discharge status: Home / Self Care | Payer: BC Managed Care – PPO | Attending: Emergency Medicine | Admitting: Emergency Medicine

## 2022-06-29 DIAGNOSIS — D72829 Elevated white blood cell count, unspecified: Secondary | ICD-10-CM | POA: Insufficient documentation

## 2022-06-29 DIAGNOSIS — R112 Nausea with vomiting, unspecified: Secondary | ICD-10-CM | POA: Diagnosis not present

## 2022-06-29 LAB — CBC
HCT: 41.6 % (ref 36.0–46.0)
Hemoglobin: 14.3 g/dL (ref 12.0–15.0)
MCH: 33.5 pg (ref 26.0–34.0)
MCHC: 34.4 g/dL (ref 30.0–36.0)
MCV: 97.4 fL (ref 80.0–100.0)
Platelets: 229 10*3/uL (ref 150–400)
RBC: 4.27 MIL/uL (ref 3.87–5.11)
RDW: 11.2 % — ABNORMAL LOW (ref 11.5–15.5)
WBC: 11.5 10*3/uL — ABNORMAL HIGH (ref 4.0–10.5)
nRBC: 0 % (ref 0.0–0.2)

## 2022-06-29 LAB — COMPREHENSIVE METABOLIC PANEL
ALT: 16 U/L (ref 0–44)
AST: 25 U/L (ref 15–41)
Albumin: 4.6 g/dL (ref 3.5–5.0)
Alkaline Phosphatase: 54 U/L (ref 38–126)
Anion gap: 6 (ref 5–15)
BUN: 15 mg/dL (ref 6–20)
CO2: 25 mmol/L (ref 22–32)
Calcium: 9.4 mg/dL (ref 8.9–10.3)
Chloride: 107 mmol/L (ref 98–111)
Creatinine, Ser: 0.76 mg/dL (ref 0.44–1.00)
GFR, Estimated: >60 mL/min (ref >60)
Glucose, Bld: 104 mg/dL — ABNORMAL HIGH (ref 70–99)
Potassium: 3.5 mmol/L (ref 3.5–5.1)
Sodium: 138 mmol/L (ref 135–145)
Total Bilirubin: 0.7 mg/dL (ref 0.3–1.2)
Total Protein: 8.2 g/dL — ABNORMAL HIGH (ref 6.5–8.1)

## 2022-06-29 LAB — URINALYSIS, ROUTINE W REFLEX MICROSCOPIC
Bilirubin Urine: NEGATIVE
Glucose, UA: NEGATIVE mg/dL
Ketones, ur: NEGATIVE mg/dL
Nitrite: NEGATIVE
Protein, ur: NEGATIVE mg/dL
Specific Gravity, Urine: 1.015 (ref 1.005–1.030)
pH: 7.5 (ref 5.0–8.0)

## 2022-06-29 LAB — URINALYSIS, MICROSCOPIC (REFLEX)

## 2022-06-29 LAB — LIPASE, BLOOD: Lipase: 31 U/L (ref 11–51)

## 2022-06-29 LAB — PREGNANCY, URINE: Preg Test, Ur: NEGATIVE

## 2022-06-29 MED ORDER — ONDANSETRON 4 MG PO TBDP: 4.0000 mg | ORAL_TABLET | Freq: Three times a day (TID) | ORAL | 0 refills | Status: DC | PRN

## 2022-06-29 NOTE — Discharge Instructions (Addendum)
Take Zofran as needed as prescribed for nausea and vomiting.  Recheck with your doctor if needed.

## 2022-06-29 NOTE — ED Triage Notes (Signed)
Pt w/ vomiting since around 0900; denies pain;  hx of cyclic vomiting in [id number]

## 2022-06-29 NOTE — ED Provider Notes (Signed)
[facility] Provider Note   CSN: [id number] Arrival date & time: 06/29/22  1812     History  Chief Complaint  Patient presents with   Emesis    Debbie Hughes is a 32 y.o. female.  32yo female with vomiting today, hx of cyclic vomiting syndrome (no episodes so far this year). Denies fevers, chills, abdominal pain. No vomiting for the past 4 hours.        Home Medications Prior to Admission medications   Medication Sig Start Date End Date Taking? Authorizing Provider  ondansetron (ZOFRAN-ODT) 4 MG disintegrating tablet Take 1 tablet (4 mg total) by mouth every 8 (eight) hours as needed for nausea or vomiting. 06/29/22  Yes Tacy Learn, PA-C  methylPREDNISolone (MEDROL DOSEPAK) 4 MG TBPK tablet Take 24 mg on day 1, 20 mg on day 2, 16 mg on day 3, 12 mg on day 4, 8 mg on day 5, 4 mg on day 6. 09/27/21   Lynden Oxford Scales, PA-C  metoCLOPramide (REGLAN) 10 MG tablet Take 1 tablet (10 mg total) by mouth every 8 (eight) hours as needed for up to 30 doses for nausea or vomiting. 11/21/19   Curatolo, Adam, DO  pantoprazole (PROTONIX) 40 MG tablet Take 1 tablet (40 mg total) by mouth daily. 11/09/18 08/08/19  Antonieta Pert, MD  potassium chloride SA (KLOR-CON) 20 MEQ tablet Take 1 tablet (20 mEq total) by mouth 2 (two) times daily for 3 days. 11/21/19 11/24/19  Molpus, Jenny Reichmann, MD  promethazine (PHENERGAN) 25 MG suppository Place 1 suppository (25 mg total) rectally every 6 (six) hours as needed for nausea. 11/18/19 11/17/20  Elodia Florence., MD      Allergies    Penicillins    Review of Systems   Review of Systems Negative except as per hpi Physical Exam Updated Vital Signs BP (!) 127/94 (BP Location: Right Arm)   Pulse 66   Temp 98.7 F (37.1 C) (Oral)   Resp 16   Ht '5\' 3"'$  (1.6 m)   Wt 56.8 kg   SpO2 100%   BMI 22.18 kg/m  Physical Exam Vitals and nursing note reviewed.  Constitutional:      General: She is not in acute  distress.    Appearance: She is well-developed. She is not diaphoretic.  HENT:     Head: Normocephalic and atraumatic.  Pulmonary:     Effort: Pulmonary effort is normal.  Abdominal:     Palpations: Abdomen is soft.     Tenderness: There is no abdominal tenderness. There is no right CVA tenderness or left CVA tenderness.  Skin:    General: Skin is warm and dry.     Findings: No erythema or rash.  Neurological:     Mental Status: She is alert and oriented to person, place, and time.  Psychiatric:        Behavior: Behavior normal.     ED Results / Procedures / Treatments   Labs (all labs ordered are listed, but only abnormal results are displayed) Labs Reviewed  COMPREHENSIVE METABOLIC PANEL - Abnormal; Notable for the following components:      Result Value   Glucose, Bld 104 (*)    Total Protein 8.2 (*)    All other components within normal limits  CBC - Abnormal; Notable for the following components:   WBC 11.5 (*)    RDW 11.2 (*)    All other components within normal limits  URINALYSIS, ROUTINE  W REFLEX MICROSCOPIC - Abnormal; Notable for the following components:   Hgb urine dipstick MODERATE (*)    Leukocytes,Ua MODERATE (*)    All other components within normal limits  URINALYSIS, MICROSCOPIC (REFLEX) - Abnormal; Notable for the following components:   Bacteria, UA FEW (*)    Non Squamous Epithelial PRESENT (*)    All other components within normal limits  LIPASE, BLOOD  PREGNANCY, URINE    EKG None  Radiology No results found.  Procedures Procedures    Medications Ordered in ED Medications - No data to display  ED Course/ Medical Decision Making/ A&P                             Medical Decision Making Amount and/or Complexity of Data Reviewed Labs: ordered.  Risk Prescription drug management.   32 year old female presents with complaint of vomiting earlier today.  Due to her history of cyclic vomiting syndrome, was concerned that she was not  able to stop her vomiting at home, did not have anything for her vomiting at home to take.  She denies fevers, chills, changes in bowel or bladder habits or abdominal pain.  Patient arrived in the emergency room and was 4 hours ago, has not vomited since arrival, has not had anything for vomiting.  On exam, is well-appearing, her abdomen is soft and nontender.  Labs reviewed, CBC with mild leukocytosis of 11.5, possibly secondary to vomiting.  Her hCG is negative.  CMP is unremarkable.  Urinalysis shows moderate hemoglobin and leukocytes, contaminated sample without urinary symptoms, do not need to treat.  Lipase within normal is.  As symptoms have resolved, she is tolerating p.o. fluids, plan is for discharge home with prescription for Zofran should she need it with recheck with PCP.  No prior abdominal surgeries        Final Clinical Impression(s) / ED Diagnoses Final diagnoses:  Nausea and vomiting, unspecified vomiting type    Rx / DC Orders ED Discharge Orders          Ordered    ondansetron (ZOFRAN-ODT) 4 MG disintegrating tablet  Every 8 hours PRN        06/29/22 2158              Tacy Learn, PA-C 06/29/22 2200    Gareth Morgan, MD 06/30/22 1304

## 2022-08-08 ENCOUNTER — Other Ambulatory Visit: Payer: Self-pay

## 2022-08-08 ENCOUNTER — Emergency Department (HOSPITAL_BASED_OUTPATIENT_CLINIC_OR_DEPARTMENT_OTHER)
Admission: EM | Admit: 2022-08-08 | Discharge: 2022-08-08 | Disposition: A | Discharge status: Home / Self Care | Payer: BC Managed Care – PPO | Attending: Emergency Medicine | Admitting: Emergency Medicine

## 2022-08-08 ENCOUNTER — Emergency Department (HOSPITAL_BASED_OUTPATIENT_CLINIC_OR_DEPARTMENT_OTHER): Payer: BC Managed Care – PPO | Admitting: Radiology

## 2022-08-08 ENCOUNTER — Encounter (HOSPITAL_BASED_OUTPATIENT_CLINIC_OR_DEPARTMENT_OTHER): Payer: Self-pay | Admitting: Emergency Medicine

## 2022-08-08 DIAGNOSIS — R0602 Shortness of breath: Secondary | ICD-10-CM | POA: Diagnosis not present

## 2022-08-08 DIAGNOSIS — R051 Acute cough: Secondary | ICD-10-CM | POA: Diagnosis not present

## 2022-08-08 DIAGNOSIS — R059 Cough, unspecified: Secondary | ICD-10-CM | POA: Diagnosis present

## 2022-08-08 MED ORDER — ALBUTEROL SULFATE HFA 108 (90 BASE) MCG/ACT IN AERS: 1.0000 | INHALATION_SPRAY | Freq: Four times a day (QID) | RESPIRATORY_TRACT | 0 refills | Status: AC | PRN

## 2022-08-08 MED ORDER — ALBUTEROL SULFATE (2.5 MG/3ML) 0.083% IN NEBU
INHALATION_SOLUTION | RESPIRATORY_TRACT | Status: AC
  Administered 2022-08-08: 2.5 mg
  Filled 2022-08-08: qty 3

## 2022-08-08 MED ORDER — PREDNISONE 20 MG PO TABS: 40.0000 mg | ORAL_TABLET | Freq: Every day | ORAL | 0 refills | Status: AC

## 2022-08-08 NOTE — ED Provider Notes (Signed)
[facility] Provider Note   CSN: [id number] Arrival date & time: 08/08/22  [id number]     History  No chief complaint on file.   Debbie Hughes is a 32 y.o. female.  32 y.o female with no PMH presents to the ED with a chief complaint of shortness of breath and cough x 2 weeks. Patient reports she was at school today when she had a coughing fit and she felt like she couldn't catch her breath. She reports a productive cough with some yellow sputum.  She has not taken any over-the-counter medication for improvement in symptoms.  She does not have any prior history of asthma, she did try an inhaler today along with a breathing treatment with some improvement in symptoms.  No fever, no chest pain, no leg swelling.  The history is provided by the patient.       Home Medications Prior to Admission medications   Medication Sig Start Date End Date Taking? Authorizing Provider  albuterol (VENTOLIN HFA) 108 (90 Base) MCG/ACT inhaler Inhale 1-2 puffs into the lungs every 6 (six) hours as needed for up to 15 days for wheezing or shortness of breath. 08/08/22 08/23/22 Yes Akshaj Besancon, Beverley Fiedler, PA-C  predniSONE (DELTASONE) 20 MG tablet Take 2 tablets (40 mg total) by mouth daily for 5 days. 08/08/22 08/13/22 Yes Nyle Limb, Beverley Fiedler, PA-C  metoCLOPramide (REGLAN) 10 MG tablet Take 1 tablet (10 mg total) by mouth every 8 (eight) hours as needed for up to 30 doses for nausea or vomiting. 11/21/19   Curatolo, Adam, DO  ondansetron (ZOFRAN-ODT) 4 MG disintegrating tablet Take 1 tablet (4 mg total) by mouth every 8 (eight) hours as needed for nausea or vomiting. 06/29/22   Tacy Learn, PA-C  pantoprazole (PROTONIX) 40 MG tablet Take 1 tablet (40 mg total) by mouth daily. 11/09/18 08/08/19  Antonieta Pert, MD  potassium chloride SA (KLOR-CON) 20 MEQ tablet Take 1 tablet (20 mEq total) by mouth 2 (two) times daily for 3 days. 11/21/19 11/24/19  Molpus, Jenny Reichmann, MD  promethazine (PHENERGAN) 25 MG  suppository Place 1 suppository (25 mg total) rectally every 6 (six) hours as needed for nausea. 11/18/19 11/17/20  Elodia Florence., MD      Allergies    Penicillins    Review of Systems   Review of Systems  Constitutional:  Negative for chills and fever.  Respiratory:  Positive for cough. Negative for shortness of breath.   Cardiovascular:  Negative for chest pain.    Physical Exam Updated Vital Signs BP (!) 110/53 (BP Location: Right Arm)   Pulse (!) 105   Temp 98.2 F (36.8 C) (Oral)   Resp (!) 24   SpO2 100%  Physical Exam Vitals and nursing note reviewed.  Constitutional:      Appearance: Normal appearance.  HENT:     Head: Normocephalic and atraumatic.     Mouth/Throat:     Mouth: Mucous membranes are dry.  Eyes:     Pupils: Pupils are equal, round, and reactive to light.  Cardiovascular:     Rate and Rhythm: Normal rate.  Pulmonary:     Effort: Pulmonary effort is normal.     Breath sounds: No wheezing.     Comments: No wheezing, no rhonchi or rales.  No tachypnea noted.  Lungs auscultated after breathing treatment. Abdominal:     General: Abdomen is flat.  Musculoskeletal:     Cervical back: Normal range of motion and neck supple.  Skin:    General: Skin is warm and dry.  Neurological:     Mental Status: She is alert and oriented to person, place, and time.     ED Results / Procedures / Treatments   Labs (all labs ordered are listed, but only abnormal results are displayed) Labs Reviewed - No data to display  EKG None  Radiology DG Chest 2 View  Result Date: 08/08/2022 CLINICAL DATA:  sob EXAM: CHEST - 2 VIEW COMPARISON:  11/15/2019. FINDINGS: Cardiac silhouette is unremarkable. No pneumothorax or pleural effusion. The lungs are clear. Convex right midthoracic scoliosis noted with 48 degree angulation. IMPRESSION: No acute cardiopulmonary process.  Scoliosis. Electronically Signed   By: Sammie Bench M.D.   On: 08/08/2022 10:09     Procedures Procedures    Medications Ordered in ED Medications  albuterol (PROVENTIL) (2.5 MG/3ML) 0.083% nebulizer solution (2.5 mg  Given 08/08/22 [id number])    ED Course/ Medical Decision Making/ A&P                             Medical Decision Making Amount and/or Complexity of Data Reviewed Radiology: ordered.   Patient here with productive cough with yellow sputum for the past 2 weeks, shortness of breath whenever coughing fits attack.  She did have nebulizer treatment along with inhaler here with some improvement in her symptoms.  She has not taken any over-the-counter medication.  She denies any fever.  We discussed testing for COVID-19 versus influenza, however she is out of window for treatment for both of these.  Her vitals are within normal limits, slight increase in her heart rate after breathing treatment.  She is afebrile.  Will obtain a chest x-ray to rule out pneumonia.  X-ray does not show any signs of pneumonia, no acute findings.  Vitals are stable.  We discussed a short course of steroids to help suppress cough, along with some over-the-counter cough suppressant.  She also go home with a albuterol inhaler to help with her breathing.  She is agreeable to plan and treatment, patient hemodynamically stable for discharge.    Portions of this note were generated with [employer]. Dictation errors may occur despite best attempts at proofreading.   Final Clinical Impression(s) / ED Diagnoses Final diagnoses:  Acute cough    Rx / DC Orders ED Discharge Orders          Ordered    albuterol (VENTOLIN HFA) 108 (90 Base) MCG/ACT inhaler  Every 6 hours PRN        08/08/22 1026    predniSONE (DELTASONE) 20 MG tablet  Daily        08/08/22 1028              Janeece Fitting, PA-C 08/08/22 Nelson, [place], DO 08/08/22 1219

## 2022-08-08 NOTE — Discharge Instructions (Signed)
You are seen did not show any signs of pneumonia on today's visit.  You are prescribed a short course of steroids to help with your breathing, please take 2 tablets daily for the next 5 days.  In addition you were also provided with an inhaler, please use this as needed to help with any wheezing.

## 2022-08-08 NOTE — ED Triage Notes (Signed)
Cough/congested for about 2 weeks, today was coughing at work and couldn't catch her breath, gave an albuterol inhaler by school prof. And helped some.

## 2022-08-08 NOTE — ED Notes (Signed)
Discharge paperwork given and verbally understood. 

## 2022-08-12 ENCOUNTER — Telehealth (HOSPITAL_BASED_OUTPATIENT_CLINIC_OR_DEPARTMENT_OTHER): Payer: Self-pay | Admitting: Emergency Medicine

## 2023-03-11 ENCOUNTER — Other Ambulatory Visit: Payer: Self-pay

## 2023-03-11 ENCOUNTER — Other Ambulatory Visit (HOSPITAL_BASED_OUTPATIENT_CLINIC_OR_DEPARTMENT_OTHER): Payer: Self-pay

## 2023-03-11 ENCOUNTER — Encounter (HOSPITAL_BASED_OUTPATIENT_CLINIC_OR_DEPARTMENT_OTHER): Payer: Self-pay

## 2023-03-11 ENCOUNTER — Emergency Department (HOSPITAL_BASED_OUTPATIENT_CLINIC_OR_DEPARTMENT_OTHER)
Admission: EM | Admit: 2023-03-11 | Discharge: 2023-03-11 | Disposition: A | Discharge status: Home / Self Care | Payer: BC Managed Care – PPO | Attending: Emergency Medicine | Admitting: Emergency Medicine

## 2023-03-11 DIAGNOSIS — J029 Acute pharyngitis, unspecified: Secondary | ICD-10-CM | POA: Diagnosis present

## 2023-03-11 DIAGNOSIS — J020 Streptococcal pharyngitis: Secondary | ICD-10-CM | POA: Insufficient documentation

## 2023-03-11 LAB — GROUP A STREP BY PCR: Group A Strep by PCR: DETECTED — AB

## 2023-03-11 MED ORDER — CLINDAMYCIN HCL 300 MG PO CAPS
300.0000 mg | ORAL_CAPSULE | Freq: Three times a day (TID) | ORAL | 0 refills | Status: AC
  Filled 2023-03-11: qty 30, 10d supply, fill #0

## 2023-03-11 NOTE — ED Provider Notes (Signed)
[facility] Provider Note   CSN: [id number] Arrival date & time: 03/11/23  1037     History  Chief Complaint  Patient presents with   Sore Throat    Debbie Hughes is a 32 y.o. female.  32 year old female presents with complaint of sore throat x 2 days.  Denies vomiting, fevers, cough or congestion.  Exposed to husband who is positive for strep.  No other complaints or concerns.       Home Medications Prior to Admission medications   Medication Sig Start Date End Date Taking? Authorizing Provider  clindamycin (CLEOCIN) 300 MG capsule Take 1 capsule (300 mg total) by mouth 3 (three) times daily for 10 days. 03/11/23 03/21/23 Yes Jeannie Fend, PA-C  albuterol (VENTOLIN HFA) 108 (90 Base) MCG/ACT inhaler Inhale 1-2 puffs into the lungs every 6 (six) hours as needed for up to 15 days for wheezing or shortness of breath. 08/08/22 08/23/22  Claude Manges, PA-C  metoCLOPramide (REGLAN) 10 MG tablet Take 1 tablet (10 mg total) by mouth every 8 (eight) hours as needed for up to 30 doses for nausea or vomiting. 11/21/19   Curatolo, Adam, DO  ondansetron (ZOFRAN-ODT) 4 MG disintegrating tablet Take 1 tablet (4 mg total) by mouth every 8 (eight) hours as needed for nausea or vomiting. 06/29/22   Jeannie Fend, PA-C  pantoprazole (PROTONIX) 40 MG tablet Take 1 tablet (40 mg total) by mouth daily. 11/09/18 08/08/19  Lanae Boast, MD  potassium chloride SA (KLOR-CON) 20 MEQ tablet Take 1 tablet (20 mEq total) by mouth 2 (two) times daily for 3 days. 11/21/19 11/24/19  Molpus, Jonny Ruiz, MD  promethazine (PHENERGAN) 25 MG suppository Place 1 suppository (25 mg total) rectally every 6 (six) hours as needed for nausea. 11/18/19 11/17/20  Zigmund Daniel., MD      Allergies    Penicillins    Review of Systems   Review of Systems Negative except as per HPI Physical Exam Updated Vital Signs BP 120/83 (BP Location: Right Arm)   Pulse 87   Temp 98 F (36.7 C)    Resp 17   Ht 5\' 4"  (1.626 m)   Wt 54.4 kg   SpO2 99%   BMI 20.60 kg/m  Physical Exam Vitals and nursing note reviewed.  Constitutional:      General: She is not in acute distress.    Appearance: She is well-developed. She is not diaphoretic.  HENT:     Head: Normocephalic and atraumatic.     Nose: No congestion.     Mouth/Throat:     Mouth: Mucous membranes are moist.     Pharynx: Uvula midline. Posterior oropharyngeal erythema present. No pharyngeal swelling, oropharyngeal exudate or uvula swelling.     Tonsils: No tonsillar exudate or tonsillar abscesses.  Cardiovascular:     Rate and Rhythm: Normal rate and regular rhythm.     Heart sounds: Normal heart sounds.  Pulmonary:     Effort: Pulmonary effort is normal.     Breath sounds: Normal breath sounds.  Musculoskeletal:     Cervical back: Neck supple.  Lymphadenopathy:     Cervical: No cervical adenopathy.  Skin:    General: Skin is warm and dry.     Findings: No erythema or rash.  Neurological:     Mental Status: She is alert and oriented to person, place, and time.  Psychiatric:        Behavior: Behavior normal.  ED Results / Procedures / Treatments   Labs (all labs ordered are listed, but only abnormal results are displayed) Labs Reviewed  GROUP A STREP BY PCR - Abnormal; Notable for the following components:      Result Value   Group A Strep by PCR DETECTED (*)    All other components within normal limits    EKG None  Radiology No results found.  Procedures Procedures    Medications Ordered in ED Medications - No data to display  ED Course/ Medical Decision Making/ A&P                                 Medical Decision Making  32 year old female with sore throat x 2 days after exposure to strep.  Exam is overall reassuring, does have mild erythema of the oropharynx without exudate on the tonsils.  No tender cervical lymph adenopathy.  Is tolerating secretions, voice is normal.  Strep is  positive.  Patient is allergic to penicillin, will treat with clindamycin.  Recommend recheck with PCP as needed.        Final Clinical Impression(s) / ED Diagnoses Final diagnoses:  Strep throat    Rx / DC Orders ED Discharge Orders          Ordered    clindamycin (CLEOCIN) 300 MG capsule  3 times daily        03/11/23 1153              Alden Hipp 03/11/23 1156    Margarita Grizzle, MD 03/11/23 1606

## 2023-03-11 NOTE — Discharge Instructions (Signed)
Take antibiotics as prescribed and complete the full course.  Recheck with your primary care provider if needed.  Return to ER for worsening or concerning symptoms.

## 2023-03-11 NOTE — ED Triage Notes (Signed)
In for eval of sore throat x2 days. Husband positive for strep yesterday.

## 2023-09-05 ENCOUNTER — Encounter (HOSPITAL_BASED_OUTPATIENT_CLINIC_OR_DEPARTMENT_OTHER): Payer: Self-pay

## 2023-09-05 ENCOUNTER — Other Ambulatory Visit: Payer: Self-pay

## 2023-09-05 ENCOUNTER — Emergency Department (HOSPITAL_BASED_OUTPATIENT_CLINIC_OR_DEPARTMENT_OTHER)
Admission: EM | Admit: 2023-09-05 | Discharge: 2023-09-05 | Disposition: A | Discharge status: Home / Self Care | Attending: Emergency Medicine | Admitting: Emergency Medicine

## 2023-09-05 ENCOUNTER — Emergency Department (HOSPITAL_BASED_OUTPATIENT_CLINIC_OR_DEPARTMENT_OTHER)

## 2023-09-05 DIAGNOSIS — X501XXA Overexertion from prolonged static or awkward postures, initial encounter: Secondary | ICD-10-CM | POA: Diagnosis not present

## 2023-09-05 DIAGNOSIS — M25511 Pain in right shoulder: Secondary | ICD-10-CM | POA: Insufficient documentation

## 2023-09-05 MED ORDER — METHOCARBAMOL 500 MG PO TABS: 500.0000 mg | ORAL_TABLET | Freq: Two times a day (BID) | ORAL | 0 refills | Status: AC

## 2023-09-05 MED ORDER — NAPROXEN 500 MG PO TABS: 500.0000 mg | ORAL_TABLET | Freq: Two times a day (BID) | ORAL | 0 refills | Status: DC

## 2023-09-05 MED ORDER — LIDOCAINE 5 % EX PTCH: 1.0000 | MEDICATED_PATCH | CUTANEOUS | 0 refills | Status: AC

## 2023-09-05 NOTE — ED Provider Notes (Signed)
 [place] [facility] AT [facility] Provider Note   CSN: [id number] Arrival date & time: 09/05/23  1633    History  Chief Complaint  Patient presents with   Shoulder Injury    Debbie Hughes is a 33 y.o. female here for evaluation of right shoulder pain.  Started today just PT gait.  She went to lift up her daughter she felt a pop to her right shoulder.  Only had pain with range of motion.  She is left-hand dominant.  Pain at the lateral superior aspect of her shoulder.  She has no pain to her distal humerus, forearm, scapular or clavicle.  No numbness, weakness.  Pain worse with movement.  No swelling to the extremity.  Was not having any issues prior to injury.  HPI     Home Medications Prior to Admission medications   Medication Sig Start Date End Date Taking? Authorizing Provider  lidocaine  (LIDODERM ) 5 % Place 1 patch onto the skin daily. Remove & Discard patch within 12 hours or as directed by MD 09/05/23  Yes Lisbet Busker A, PA-C  methocarbamol  (ROBAXIN ) 500 MG tablet Take 1 tablet (500 mg total) by mouth 2 (two) times daily. 09/05/23  Yes Delani Kohli A, PA-C  naproxen (NAPROSYN) 500 MG tablet Take 1 tablet (500 mg total) by mouth 2 (two) times daily. 09/05/23  Yes Friend Dorfman A, PA-C  albuterol  (VENTOLIN  HFA) 108 (90 Base) MCG/ACT inhaler Inhale 1-2 puffs into the lungs every 6 (six) hours as needed for up to 15 days for wheezing or shortness of breath. 08/08/22 08/23/22  Soto, Johana, PA-C  metoCLOPramide  (REGLAN ) 10 MG tablet Take 1 tablet (10 mg total) by mouth every 8 (eight) hours as needed for up to 30 doses for nausea or vomiting. 11/21/19   Curatolo, Adam, DO  ondansetron  (ZOFRAN -ODT) 4 MG disintegrating tablet Take 1 tablet (4 mg total) by mouth every 8 (eight) hours as needed for nausea or vomiting. 06/29/22   Darlis Eisenmenger, PA-C  pantoprazole  (PROTONIX ) 40 MG tablet Take 1 tablet (40 mg total) by mouth daily. 11/09/18 08/08/19  Lesa Rape, MD   potassium chloride  SA (KLOR-CON ) 20 MEQ tablet Take 1 tablet (20 mEq total) by mouth 2 (two) times daily for 3 days. 11/21/19 11/24/19  Molpus, Autry Legions, MD  promethazine  (PHENERGAN ) 25 MG suppository Place 1 suppository (25 mg total) rectally every 6 (six) hours as needed for nausea. 11/18/19 11/17/20  Etter Hermann., MD      Allergies    Penicillins    Review of Systems   Review of Systems  Constitutional: Negative.   HENT: Negative.    Respiratory: Negative.    Cardiovascular: Negative.   Gastrointestinal: Negative.   Genitourinary: Negative.   Musculoskeletal:  Negative for back pain, gait problem, joint swelling, myalgias, neck pain and neck stiffness.       Right shoulder pain  Skin: Negative.   Neurological: Negative.   All other systems reviewed and are negative.   Physical Exam Updated Vital Signs BP 108/69 (BP Location: Left Arm)   Pulse 95   Temp 99.9 F (37.7 C) (Oral)   Resp 16   Ht 5\' 3"  (1.6 m)   Wt 56.2 kg   SpO2 96%   BMI 21.97 kg/m  Physical Exam Vitals and nursing note reviewed.  Constitutional:      General: She is not in acute distress.    Appearance: She is well-developed. She is not ill-appearing, toxic-appearing or diaphoretic.  HENT:     Head: Normocephalic and atraumatic.     Nose: Nose normal.     Mouth/Throat:     Mouth: Mucous membranes are moist.  Eyes:     Pupils: Pupils are equal, round, and reactive to light.  Cardiovascular:     Rate and Rhythm: Normal rate.     Pulses: Normal pulses.  Pulmonary:     Effort: No respiratory distress.  Abdominal:     General: There is no distension.  Musculoskeletal:        General: Normal range of motion.     Cervical back: Normal range of motion.     Comments: No midline tenderness. Non tender clavicle, scapula. Tenderness to superior, lateral aspect left shoulder. Non tender midshaft, distal humerus, forearm, hand.  Skin:    General: Skin is warm and dry.  Neurological:     General: No  focal deficit present.     Mental Status: She is alert.  Psychiatric:        Mood and Affect: Mood normal.     ED Results / Procedures / Treatments   Labs (all labs ordered are listed, but only abnormal results are displayed) Labs Reviewed - No data to display  EKG None  Radiology DG Shoulder Right Result Date: 09/05/2023 CLINICAL DATA:  Pain after injury to the right shoulder today. Patient felt a pop while lifting daughter and now has pain. EXAM: RIGHT SHOULDER - 2+ VIEW COMPARISON:  None Available. FINDINGS: Right shoulder appears intact. No evidence of acute fracture or dislocation. No focal bone lesion or bone destruction. Coracoclavicular and acromioclavicular spaces are normal. Soft tissues are unremarkable. Thoracic scoliosis convex towards the right. IMPRESSION: No acute bony abnormalities. Electronically Signed   By: Boyce Byes M.D.   On: 09/05/2023 17:15    Procedures .Ortho Injury Treatment  Date/Time: 09/05/2023 6:11 PM  Performed by: Dickson Founds, PA-C Authorized by: Dickson Founds, PA-C   Consent:    Consent obtained:  Verbal   Consent given by:  Patient   Risks discussed:  Fracture, nerve damage, restricted joint movement, vascular damage, stiffness, recurrent dislocation and irreducible dislocation   Alternatives discussed:  No treatment, alternative treatment, immobilization, referral and delayed treatmentInjury location: shoulder Location details: right shoulder Injury type: soft tissue Pre-procedure neurovascular assessment: neurovascularly intact Pre-procedure distal perfusion: normal Pre-procedure neurological function: normal Pre-procedure range of motion: normal  Anesthesia: Local anesthesia used: no  Patient sedated: NoImmobilization: sling Splint Applied by: ED Tech Post-procedure neurovascular assessment: post-procedure neurovascularly intact Post-procedure distal perfusion: normal Post-procedure neurological function:  normal Post-procedure range of motion: normal       Medications Ordered in ED Medications - No data to display  ED Course/ Medical Decision Making/ A&P   33 year old here for evaluation of left shoulder pain after attempting to lift up her child.  Felt a pop sensation to superior lateral aspect of her right shoulder.  She is neurovascularly intact.  She has some decreased passive range of motion overhead due to pain.  She has no obvious step-offs or dislocation.  Does not clinically have evidence of VTE or infectious process.  Her compartments are soft.  Will plan on x-ray.  Imaging personally viewed and interpreted:  X-ray without fracture, dislocation  Discussed results with patient.  I suspect she likely has a tendon or ligament injury.  She was placed in a sling.  Discussed symptomatic management.  Will have her follow-up with orthopedics.  Low suspicion for septic joint, gout, hemarthrosis,  occult fracture, dislocation, bacterial infectious process, VTE, ischemia  The patient has been appropriately medically screened and/or stabilized in the ED. I have low suspicion for any other emergent medical condition which would require further screening, evaluation or treatment in the ED or require inpatient management.  Patient is hemodynamically stable and in no acute distress.  Patient able to ambulate in department prior to ED.  Evaluation does not show acute pathology that would require ongoing or additional emergent interventions while in the emergency department or further inpatient treatment.  I have discussed the diagnosis with the patient and answered all questions.  Pain is been managed while in the emergency department and patient has no further complaints prior to discharge.  Patient is comfortable with plan discussed in room and is stable for discharge at this time.  I have discussed strict return precautions for returning to the emergency department.  Patient was encouraged to  follow-up with PCP/specialist refer to at discharge.                                 Medical Decision Making Amount and/or Complexity of Data Reviewed External Data Reviewed: radiology and notes. Radiology: ordered and independent interpretation performed. Decision-making details documented in ED Course.  Risk OTC drugs. Prescription drug management. Decision regarding hospitalization. Diagnosis or treatment significantly limited by social determinants of health.           Final Clinical Impression(s) / ED Diagnoses Final diagnoses:  Acute pain of right shoulder    Rx / DC Orders ED Discharge Orders          Ordered    methocarbamol  (ROBAXIN ) 500 MG tablet  2 times daily        09/05/23 1738    naproxen (NAPROSYN) 500 MG tablet  2 times daily        09/05/23 1738    lidocaine  (LIDODERM ) 5 %  Every 24 hours        09/05/23 1738              Chasty Randal A, PA-C 09/05/23 1812    Horton, Sidra Dredge, DO 09/06/23 0015

## 2023-09-05 NOTE — Discharge Instructions (Addendum)
 It was a pleasure taking care of you here today.  You likely have an injury to your shoulder.  We have given you a sling as needed for comfort.  Would recommend gentle stretching exercises.  I have written you for a few medications to help with your symptoms   Robaxin -this is a muscle relaxer take as needed.  May make you sleepy the first few times you take it  Naproxen this is an anti-inflammatory do not take any other additional ibuprofen  containing products while taking this medication  Lidoderm  patch-placed to the area.  You leave on for 12 hours, remove for 12 hours.  Must be patch free for 12 hours before placing additional patch   Do not use above medications if you think you may be pregnant.  make sure to follow-up outpatient, return for any worsening symptoms

## 2023-09-05 NOTE — ED Triage Notes (Signed)
 States raised daughter up in air for basket ball throw and felt something "pop" in right shoulder.  Has pain in shoulder and difficultly raising arm

## 2023-09-11 ENCOUNTER — Emergency Department (HOSPITAL_BASED_OUTPATIENT_CLINIC_OR_DEPARTMENT_OTHER)
Admission: EM | Admit: 2023-09-11 | Discharge: 2023-09-11 | Disposition: A | Discharge status: Home / Self Care | Attending: Emergency Medicine | Admitting: Emergency Medicine

## 2023-09-11 ENCOUNTER — Other Ambulatory Visit: Payer: Self-pay

## 2023-09-11 DIAGNOSIS — M25511 Pain in right shoulder: Secondary | ICD-10-CM | POA: Insufficient documentation

## 2023-09-11 DIAGNOSIS — R1013 Epigastric pain: Secondary | ICD-10-CM | POA: Diagnosis not present

## 2023-09-11 MED ORDER — SUCRALFATE 1 G PO TABS: 1.0000 g | ORAL_TABLET | Freq: Three times a day (TID) | ORAL | 0 refills | Status: AC

## 2023-09-11 MED ORDER — PANTOPRAZOLE SODIUM 40 MG PO TBEC: 40.0000 mg | DELAYED_RELEASE_TABLET | Freq: Every day | ORAL | 0 refills | Status: AC

## 2023-09-11 MED ORDER — METHYLPREDNISOLONE 4 MG PO TBPK: ORAL_TABLET | ORAL | 0 refills | Status: AC

## 2023-09-11 NOTE — Discharge Instructions (Addendum)
 Overall continue sling for comfort.  Continue lidocaine  patches as needed.  I prescribed you anti-inflammatory called Medrol  Dosepak, take your first dose when you get home.  Follow-up with orthopedics number provided.  As far as your stomach I do think that this could be stomach acid related.  Restart Protonix  for the next 2 weeks and consider filling prescription for Carafate as well to take for 2 weeks to help control stomach acid especially while you are taking anti-inflammatories.  Follow-up with your primary care doctor.  Return if you develop constant pain, fever or other worsening symptoms as we discussed.

## 2023-09-11 NOTE — ED Provider Notes (Signed)
 [place] [facility] AT [facility] Provider Note   CSN: [id number] Arrival date & time: 09/11/23  6440     History  Chief Complaint  Patient presents with   Shoulder Injury   Abdominal Pain    Debbie Hughes is a 33 y.o. female.  Patient here for right shoulder pain.  Injured it about a week ago lifting up child.  Heard a pop in her arm.  She still having some pain with range of motion.  Has used naproxen and Robaxin  lidocaine  with minimal relief.  She denies any weakness numbness tingling.  She has been using the sling.  She also mentions maybe some increased upper abdominal pain at times this week as well.  No pain now.  No fever no chills no nausea vomiting diarrhea.  Has some chronic GI issues cyclical vomiting syndrome but not feeling nauseous symptoms have been mostly under control.  She has not been on reflux meds for a while.  Denies any concern for pregnancy.  She has birth control.  The history is provided by the patient.       Home Medications Prior to Admission medications   Medication Sig Start Date End Date Taking? Authorizing Provider  methylPREDNISolone  (MEDROL  DOSEPAK) 4 MG TBPK tablet Follow package insert 09/11/23  Yes Arelys Glassco, DO  sucralfate (CARAFATE) 1 g tablet Take 1 tablet (1 g total) by mouth 4 (four) times daily -  with meals and at bedtime for 14 days. 09/11/23 09/25/23 Yes Melenda Bielak, DO  albuterol  (VENTOLIN  HFA) 108 (90 Base) MCG/ACT inhaler Inhale 1-2 puffs into the lungs every 6 (six) hours as needed for up to 15 days for wheezing or shortness of breath. 08/08/22 08/23/22  Soto, Johana, PA-C  lidocaine  (LIDODERM ) 5 % Place 1 patch onto the skin daily. Remove & Discard patch within 12 hours or as directed by MD 09/05/23   Henderly, Britni A, PA-C  methocarbamol  (ROBAXIN ) 500 MG tablet Take 1 tablet (500 mg total) by mouth 2 (two) times daily. 09/05/23   Henderly, Britni A, PA-C  metoCLOPramide  (REGLAN ) 10 MG tablet Take 1 tablet (10  mg total) by mouth every 8 (eight) hours as needed for up to 30 doses for nausea or vomiting. 11/21/19   Sheneika Walstad, DO  ondansetron  (ZOFRAN -ODT) 4 MG disintegrating tablet Take 1 tablet (4 mg total) by mouth every 8 (eight) hours as needed for nausea or vomiting. 06/29/22   Darlis Eisenmenger, PA-C  pantoprazole  (PROTONIX ) 40 MG tablet Take 1 tablet (40 mg total) by mouth daily for 14 days. 09/11/23 09/25/23  Saory Carriero, DO  potassium chloride  SA (KLOR-CON ) 20 MEQ tablet Take 1 tablet (20 mEq total) by mouth 2 (two) times daily for 3 days. 11/21/19 11/24/19  Molpus, Autry Legions, MD  promethazine  (PHENERGAN ) 25 MG suppository Place 1 suppository (25 mg total) rectally every 6 (six) hours as needed for nausea. 11/18/19 11/17/20  Etter Hermann., MD      Allergies    Penicillins    Review of Systems   Review of Systems  Physical Exam Updated Vital Signs BP 124/67 (BP Location: Left Arm)   Pulse 93   Temp 98.6 F (37 C) (Oral)   Resp 14   SpO2 98%  Physical Exam Vitals and nursing note reviewed.  Constitutional:      General: She is not in acute distress.    Appearance: She is well-developed.  HENT:     Head: Normocephalic and atraumatic.  Eyes:  Extraocular Movements: Extraocular movements intact.     Conjunctiva/sclera: Conjunctivae normal.     Pupils: Pupils are equal, round, and reactive to light.  Cardiovascular:     Rate and Rhythm: Normal rate and regular rhythm.     Pulses: Normal pulses.     Heart sounds: Normal heart sounds. No murmur heard. Pulmonary:     Effort: Pulmonary effort is normal. No respiratory distress.     Breath sounds: Normal breath sounds.  Abdominal:     Palpations: Abdomen is soft.     Tenderness: There is no abdominal tenderness.  Musculoskeletal:        General: No swelling.     Cervical back: Neck supple.     Comments: Pain with range of motion of the right shoulder but no obvious laxity in the shoulder joint or obvious weakness  Skin:     General: Skin is warm and dry.     Capillary Refill: Capillary refill takes less than 2 seconds.  Neurological:     General: No focal deficit present.     Mental Status: She is alert.     Sensory: No sensory deficit.     Motor: No weakness.  Psychiatric:        Mood and Affect: Mood normal.     ED Results / Procedures / Treatments   Labs (all labs ordered are listed, but only abnormal results are displayed) Labs Reviewed - No data to display  EKG None  Radiology No results found.  Procedures Procedures    Medications Ordered in ED Medications - No data to display  ED Course/ Medical Decision Making/ A&P                                 Medical Decision Making Risk Prescription drug management.   Debbie Hughes is here with ongoing right shoulder pain.  Had x-ray last week that was unremarkable.  Differential diagnosis likely soft tissue injury in the right shoulder.  I do not have any major concern for tendon rupture or tear given physical.  She is tender mostly with range of motion and suspect may be a mild shoulder sprain.  Continue range of motion exercises at home.  Will do Medrol  Dosepak.  She has no concern for pregnancy.  She has had some upper abdominal discomfort at times this week which I think could be related to naproxen and anti-inflammatories.  She has history of cyclical vomiting she is not on antireflux meds anymore.  Will put her back on some during this course of Medrol  Dosepak.  Which is not having any abdominal tenderness or symptoms now.  No nausea or vomiting.  I do not think we need any further workup at this time.  Will have her follow-up with orthopedics and her primary care doctor.  She understands return precautions.  She is neurovascular neuromuscular intact on exam.  No new trauma no need for any other repeat imaging.  This chart was dictated using voice recognition software.  Despite best efforts to proofread,  errors can occur which can change  the documentation meaning.         Final Clinical Impression(s) / ED Diagnoses Final diagnoses:  Acute pain of right shoulder  Epigastric pain    Rx / DC Orders ED Discharge Orders          Ordered    pantoprazole  (PROTONIX ) 40 MG tablet  Daily  09/11/23 0823    sucralfate (CARAFATE) 1 g tablet  3 times daily with meals & bedtime        09/11/23 0823    methylPREDNISolone  (MEDROL  DOSEPAK) 4 MG TBPK tablet        09/11/23 0823              Lowery Rue, DO 09/11/23 [id number]

## 2023-09-11 NOTE — ED Triage Notes (Signed)
 Pt was lifting daughter on 5/3- heard a pop and had pain. Had pain with ROM. Unable to do daily task. Lidocaine  patches and muscle relaxer's have not helped.  Sharp pain and burning in epigastric area. Sharp burning on RUQ. NVD on and off past month.

## 2023-11-03 ENCOUNTER — Encounter (HOSPITAL_BASED_OUTPATIENT_CLINIC_OR_DEPARTMENT_OTHER): Payer: Self-pay | Admitting: Emergency Medicine

## 2023-11-03 ENCOUNTER — Other Ambulatory Visit (HOSPITAL_BASED_OUTPATIENT_CLINIC_OR_DEPARTMENT_OTHER): Payer: Self-pay

## 2023-11-03 ENCOUNTER — Emergency Department (HOSPITAL_BASED_OUTPATIENT_CLINIC_OR_DEPARTMENT_OTHER)
Admission: EM | Admit: 2023-11-03 | Discharge: 2023-11-03 | Disposition: A | Discharge status: Home / Self Care | Attending: Emergency Medicine | Admitting: Emergency Medicine

## 2023-11-03 ENCOUNTER — Other Ambulatory Visit: Payer: Self-pay

## 2023-11-03 DIAGNOSIS — R112 Nausea with vomiting, unspecified: Secondary | ICD-10-CM | POA: Diagnosis present

## 2023-11-03 DIAGNOSIS — Z87891 Personal history of nicotine dependence: Secondary | ICD-10-CM | POA: Insufficient documentation

## 2023-11-03 DIAGNOSIS — R1013 Epigastric pain: Secondary | ICD-10-CM | POA: Insufficient documentation

## 2023-11-03 LAB — CBC
HCT: 39.8 % (ref 36.0–46.0)
Hemoglobin: 13.8 g/dL (ref 12.0–15.0)
MCH: 34.1 pg — ABNORMAL HIGH (ref 26.0–34.0)
MCHC: 34.7 g/dL (ref 30.0–36.0)
MCV: 98.3 fL (ref 80.0–100.0)
Platelets: 240 10*3/uL (ref 150–400)
RBC: 4.05 MIL/uL (ref 3.87–5.11)
RDW: 11.7 % (ref 11.5–15.5)
WBC: 14.1 10*3/uL — ABNORMAL HIGH (ref 4.0–10.5)
nRBC: 0 % (ref 0.0–0.2)

## 2023-11-03 LAB — COMPREHENSIVE METABOLIC PANEL WITH GFR
ALT: 21 U/L (ref 0–44)
AST: 31 U/L (ref 15–41)
Albumin: 4.6 g/dL (ref 3.5–5.0)
Alkaline Phosphatase: 67 U/L (ref 38–126)
Anion gap: 18 — ABNORMAL HIGH (ref 5–15)
BUN: 11 mg/dL (ref 6–20)
CO2: 17 mmol/L — ABNORMAL LOW (ref 22–32)
Calcium: 10 mg/dL (ref 8.9–10.3)
Chloride: 108 mmol/L (ref 98–111)
Creatinine, Ser: 0.82 mg/dL (ref 0.44–1.00)
GFR, Estimated: >60 mL/min (ref >60)
Glucose, Bld: 104 mg/dL — ABNORMAL HIGH (ref 70–99)
Potassium: 3.9 mmol/L (ref 3.5–5.1)
Sodium: 143 mmol/L (ref 135–145)
Total Bilirubin: 0.4 mg/dL (ref 0.0–1.2)
Total Protein: 7.7 g/dL (ref 6.5–8.1)

## 2023-11-03 LAB — MAGNESIUM: Magnesium: 1.8 mg/dL (ref 1.7–2.4)

## 2023-11-03 LAB — HCG, QUANTITATIVE, PREGNANCY: hCG, Beta Chain, Quant, S: <1 m[IU]/mL (ref <5)

## 2023-11-03 LAB — LIPASE, BLOOD: Lipase: 34 U/L (ref 11–51)

## 2023-11-03 MED ORDER — ONDANSETRON HCL 4 MG/2ML IJ SOLN
4.0000 mg | Freq: Once | INTRAMUSCULAR | Status: AC | PRN
  Administered 2023-11-03: 4 mg via INTRAVENOUS
  Filled 2023-11-03: qty 2

## 2023-11-03 MED ORDER — DROPERIDOL 2.5 MG/ML IJ SOLN: 1.2500 mg | Freq: Once | INTRAMUSCULAR | Status: DC

## 2023-11-03 MED ORDER — DROPERIDOL 2.5 MG/ML IJ SOLN
1.2500 mg | Freq: Once | INTRAMUSCULAR | Status: AC
  Administered 2023-11-03: 1.25 mg via INTRAVENOUS
  Filled 2023-11-03: qty 2

## 2023-11-03 MED ORDER — FAMOTIDINE IN NACL 20-0.9 MG/50ML-% IV SOLN
20.0000 mg | Freq: Once | INTRAVENOUS | Status: AC
  Administered 2023-11-03: 20 mg via INTRAVENOUS
  Filled 2023-11-03: qty 50

## 2023-11-03 MED ORDER — PROMETHAZINE HCL 25 MG RE SUPP
25.0000 mg | Freq: Four times a day (QID) | RECTAL | 0 refills | Status: AC | PRN
  Filled 2023-11-03: qty 12, 3d supply, fill #0

## 2023-11-03 MED ORDER — ONDANSETRON 4 MG PO TBDP
4.0000 mg | ORAL_TABLET | Freq: Three times a day (TID) | ORAL | 0 refills | Status: AC | PRN
  Filled 2023-11-03: qty 20, 7d supply, fill #0

## 2023-11-03 MED ORDER — SODIUM CHLORIDE 0.9 % IV BOLUS
1000.0000 mL | Freq: Once | INTRAVENOUS | Status: AC
  Administered 2023-11-03: 1000 mL via INTRAVENOUS

## 2023-11-03 NOTE — ED Provider Notes (Signed)
 [place] EMERGENCY DEPARTMENT AT [facility] Provider Note   CSN: [id number] Arrival date & time: 11/03/23  1132     Patient presents with: Emesis   Debbie Hughes is a 33 y.o. female.    Emesis   33 year old female presents emergency department with complaints of nausea, vomiting, upper middle abdominal pain.  States that she has a history of cyclical vomiting syndrome and has been doing well with symptoms for the past year or so with dietary/lifestyle changes.  States that she ate from a food trip last night as well as had alcohol for which she typically does not consume.  Woke up around 8 AM this morning and has had upper middle abdominal pain with nausea and vomiting since then.  Has been unable to take any medications for her symptoms.  Denies any fevers, chills, hematemesis, coffee-ground emesis, urinary symptoms, change in bowel habits.  Presents emergency department for further assessment/evaluation.  Denies any recent marijuana use.  Past medical history significant for cyclical vomiting syndrome, hyperemesis gravidarum, hypokalemia, THC dependence  Prior to Admission medications   Medication Sig Start Date End Date Taking? Authorizing Provider  albuterol  (VENTOLIN  HFA) 108 (90 Base) MCG/ACT inhaler Inhale 1-2 puffs into the lungs every 6 (six) hours as needed for up to 15 days for wheezing or shortness of breath. 08/08/22 08/23/22  Soto, Johana, PA-C  lidocaine  (LIDODERM ) 5 % Place 1 patch onto the skin daily. Remove & Discard patch within 12 hours or as directed by MD 09/05/23   Henderly, Britni A, PA-C  methocarbamol  (ROBAXIN ) 500 MG tablet Take 1 tablet (500 mg total) by mouth 2 (two) times daily. 09/05/23   Henderly, Britni A, PA-C  methylPREDNISolone  (MEDROL  DOSEPAK) 4 MG TBPK tablet Follow package insert 09/11/23   Curatolo, Adam, DO  metoCLOPramide  (REGLAN ) 10 MG tablet Take 1 tablet (10 mg total) by mouth every 8 (eight) hours as needed for up to 30 doses for nausea or  vomiting. 11/21/19   Curatolo, Adam, DO  ondansetron  (ZOFRAN -ODT) 4 MG disintegrating tablet Take 1 tablet (4 mg total) by mouth every 8 (eight) hours as needed for nausea or vomiting. 06/29/22   Beverley Leita LABOR, PA-C  pantoprazole  (PROTONIX ) 40 MG tablet Take 1 tablet (40 mg total) by mouth daily for 14 days. 09/11/23 09/25/23  Curatolo, Adam, DO  potassium chloride  SA (KLOR-CON ) 20 MEQ tablet Take 1 tablet (20 mEq total) by mouth 2 (two) times daily for 3 days. 11/21/19 11/24/19  Molpus, Norleen, MD  promethazine  (PHENERGAN ) 25 MG suppository Place 1 suppository (25 mg total) rectally every 6 (six) hours as needed for nausea. 11/18/19 11/17/20  Perri LABOR Meliton Mickey., MD  sucralfate  (CARAFATE ) 1 g tablet Take 1 tablet (1 g total) by mouth 4 (four) times daily -  with meals and at bedtime for 14 days. 09/11/23 09/25/23  Ruthe Cornet, DO    Allergies: Penicillins    Review of Systems  Gastrointestinal:  Positive for vomiting.  All other systems reviewed and are negative.   Updated Vital Signs BP 114/84 (BP Location: Right Arm)   Pulse 78   Temp 97.9 F (36.6 C) (Oral)   Resp 20   Ht 5' 3 (1.6 m)   Wt 56.2 kg   BMI 21.95 kg/m   Physical Exam Vitals and nursing note reviewed.  Constitutional:      General: She is not in acute distress.    Appearance: She is well-developed.  HENT:     Head: Normocephalic and  atraumatic.   Eyes:     Conjunctiva/sclera: Conjunctivae normal.    Cardiovascular:     Rate and Rhythm: Normal rate and regular rhythm.     Heart sounds: No murmur heard. Pulmonary:     Effort: Pulmonary effort is normal. No respiratory distress.     Breath sounds: Normal breath sounds. No wheezing, rhonchi or rales.  Abdominal:     Palpations: Abdomen is soft.     Tenderness: There is abdominal tenderness.     Comments: Epigastric tenderness to palpation   Musculoskeletal:        General: No swelling.     Cervical back: Neck supple.   Skin:    General: Skin is warm and  dry.     Capillary Refill: Capillary refill takes less than 2 seconds.   Neurological:     Mental Status: She is alert.   Psychiatric:        Mood and Affect: Mood normal.    (all labs ordered are listed, but only abnormal results are displayed) Labs Reviewed  LIPASE, BLOOD  COMPREHENSIVE METABOLIC PANEL WITH GFR  CBC  URINALYSIS, ROUTINE W REFLEX MICROSCOPIC  MAGNESIUM   HCG, QUANTITATIVE, PREGNANCY    EKG: None  Radiology: No results found.   Procedures   Medications Ordered in the ED  ondansetron  (ZOFRAN ) injection 4 mg (has no administration in time range)  droperidol  (INAPSINE ) 2.5 MG/ML injection 1.25 mg (has no administration in time range)  famotidine  (PEPCID ) IVPB 20 mg premix (has no administration in time range)  sodium chloride  0.9 % bolus 1,000 mL (1,000 mLs Intravenous New Bag/Given 11/03/23 1231)                                    Medical Decision Making Amount and/or Complexity of Data Reviewed Labs: ordered.  Risk Prescription drug management.   This patient presents to the ED for concern of nausea, vomiting, this involves an extensive number of treatment options, and is a complaint that carries with it a high risk of complications and morbidity.  The differential diagnosis includes foodborne illness, gastroenteritis, gastritis, PUD, cholecystitis, CBD pathology, SBO/LBO, volvulus, diverticulitis, pancreatitis, other   Co morbidities that complicate the patient evaluation  See HPI   Additional history obtained:  Additional history obtained from EMR External records from outside source obtained and reviewed including hospital records   Lab Tests:  I Ordered, and personally interpreted labs.  The pertinent results include: Leukocytosis of 14.1.  No evidence of anemia.  Platelets within range.  Mild decreased bicarb 17 with anion gap of 18 otherwise electrolytes within normal limits.  No transaminitis.  No renal dysfunction.  hCG negative.   Lipase within normal limits.  Magnesium  within normal limits.   Imaging Studies ordered:  N/a   Cardiac Monitoring: / EKG:  The patient was maintained on a cardiac monitor.  I personally viewed and interpreted the cardiac monitored which showed an underlying rhythm of: sinus rhythm   Consultations Obtained:  N/a   Problem List / ED Course / Critical interventions / Medication management  Nausea, vomiting, epigastric abdominal pain I ordered medication including normal saline, droperidol , Pepcid    Reevaluation of the patient after these medicines showed that the patient improved I have reviewed the patients home medicines and have made adjustments as needed   Social Determinants of Health:  Former cigarette use.  Denies any current marijuana use.  Denies other substance  use.   Test / Admission - Considered:  Nausea, vomiting, epigastric abdominal pain. Vitals signs within normal range and stable throughout visit. Laboratory studies significant for: See above 33 year old female presents emergency department with complaints of nausea, vomiting, upper middle abdominal pain.  States that she has a history of cyclical vomiting syndrome and has been doing well with symptoms for the past year or so with dietary/lifestyle changes.  States that she ate from a food trip last night as well as had alcohol for which she typically does not consume.  Woke up around 8 AM this morning and has had upper middle abdominal pain with nausea and vomiting since then.  Has been unable to take any medications for her symptoms.  Denies any fevers, chills, hematemesis, coffee-ground emesis, urinary symptoms, change in bowel habits.  Presents emergency department for further assessment/evaluation.  Denies any recent marijuana use. On exam, patient verbosely retching.  Tenderness epigastric region.  Labs concerning for leukocytosis of 14, slight anion gap of 18 with bicarb of 17, otherwise labs within normal  limits.  Patient treated with IV fluids, antiemetic x 2 but had resolution of symptoms subsequent tolerance of p.o.  Repeat abdominal exam benign.  Given patient's reported history of similar symptoms in the past due to cyclical vomiting syndrome/hyperemesis cannabinoid syndrome with serial benign abdominal exam, CT imaging was deferred.  Will recommend continued cessation from marijuana use.  Concerned that current episode could have been from foodborne illness given symptom onset after eating sketchy food for a few truck late last night.  Will send patient home with antiemetic use as needed, recommend dietary/lifestyle changes and follow-up with PCP/GI in the outpatient setting.  Treatment plan discussed with patient DO treat nausea understanding was agreeable to said plan.  Patient well-appearing, afebrile in no acute distress. Worrisome signs and symptoms were discussed with the patient, and the patient acknowledged understanding to return to the ED if noticed. Patient was stable upon discharge.       Final diagnoses:  None    ED Discharge Orders     None          Silver Wonda LABOR, [place] 11/03/23 1441    Levander Houston, MD 11/04/23 1130

## 2023-11-03 NOTE — ED Notes (Signed)
 Verbal from PA, to go ahead and give the Droperidol ...

## 2023-11-03 NOTE — Discharge Instructions (Addendum)
 As discussed, we will send you home with nausea medicine to use as needed.  Recommend follow-up with primary care for reassessment of your symptoms.  Recommend bland diet over the next few days and to tolerate more complex foods.  Please do not hesitate to return if the worrisome signs and symptoms we discussed become apparent.

## 2023-11-03 NOTE — ED Triage Notes (Signed)
 Pt via pov from home with vomiting that began this morning. Pt states she had some food from a food truck yesterday, wonders if this is related. She also awakened with swollen eyes and tingly and itchy hands this morning. Pt has had cyclical vomiting in the past, but it has been a while. Denies marijuana use. Pt alert & oriented, vomiting during triage.

## 2023-11-03 NOTE — ED Notes (Signed)
 Droperidol  drawn up but not given... Before being given the Pt was having involuntary twitching... Provider informed and verbal to hold the droperidol .SABRASABRA

## 2023-11-13 ENCOUNTER — Other Ambulatory Visit (HOSPITAL_BASED_OUTPATIENT_CLINIC_OR_DEPARTMENT_OTHER): Payer: Self-pay
